# Patient Record
Sex: Female | Born: 1960 | Race: Black or African American | Hispanic: No | Marital: Married | Smoking: Former smoker
Health system: Southern US, Community
[De-identification: ages and names within clinical notes are randomized; demographics above are authoritative.]

## PROBLEM LIST (undated history)

## (undated) DIAGNOSIS — Z803 Family history of malignant neoplasm of breast: Secondary | ICD-10-CM

## (undated) DIAGNOSIS — R61 Generalized hyperhidrosis: Secondary | ICD-10-CM

## (undated) DIAGNOSIS — R5383 Other fatigue: Secondary | ICD-10-CM

## (undated) DIAGNOSIS — Z800 Family history of malignant neoplasm of digestive organs: Secondary | ICD-10-CM

## (undated) DIAGNOSIS — I10 Essential (primary) hypertension: Secondary | ICD-10-CM

## (undated) DIAGNOSIS — C189 Malignant neoplasm of colon, unspecified: Secondary | ICD-10-CM

## (undated) DIAGNOSIS — E079 Disorder of thyroid, unspecified: Secondary | ICD-10-CM

## (undated) DIAGNOSIS — R51 Headache: Secondary | ICD-10-CM

## (undated) DIAGNOSIS — M1990 Unspecified osteoarthritis, unspecified site: Secondary | ICD-10-CM

## (undated) DIAGNOSIS — G629 Polyneuropathy, unspecified: Secondary | ICD-10-CM

## (undated) DIAGNOSIS — Z973 Presence of spectacles and contact lenses: Secondary | ICD-10-CM

## (undated) DIAGNOSIS — K649 Unspecified hemorrhoids: Secondary | ICD-10-CM

## (undated) DIAGNOSIS — E785 Hyperlipidemia, unspecified: Secondary | ICD-10-CM

## (undated) DIAGNOSIS — Z8042 Family history of malignant neoplasm of prostate: Secondary | ICD-10-CM

## (undated) HISTORY — DX: Hyperlipidemia, unspecified: E78.5

## (undated) HISTORY — DX: Other fatigue: R53.83

## (undated) HISTORY — PX: APPENDECTOMY: SHX54

## (undated) HISTORY — DX: Unspecified osteoarthritis, unspecified site: M19.90

## (undated) HISTORY — DX: Essential (primary) hypertension: I10

## (undated) HISTORY — PX: OTHER SURGICAL HISTORY: SHX169

## (undated) HISTORY — DX: Disorder of thyroid, unspecified: E07.9

## (undated) HISTORY — DX: Family history of malignant neoplasm of breast: Z80.3

## (undated) HISTORY — DX: Generalized hyperhidrosis: R61

## (undated) HISTORY — DX: Headache: R51

## (undated) HISTORY — DX: Malignant neoplasm of colon, unspecified: C18.9

## (undated) HISTORY — DX: Unspecified hemorrhoids: K64.9

## (undated) HISTORY — DX: Family history of malignant neoplasm of prostate: Z80.42

## (undated) HISTORY — PX: TUBAL LIGATION: SHX77

## (undated) HISTORY — DX: Presence of spectacles and contact lenses: Z97.3

## (undated) HISTORY — PX: ABDOMINAL HYSTERECTOMY: SHX81

## (undated) HISTORY — DX: Polyneuropathy, unspecified: G62.9

## (undated) HISTORY — DX: Family history of malignant neoplasm of digestive organs: Z80.0

---

## 2002-02-09 ENCOUNTER — Emergency Department (HOSPITAL_COMMUNITY): Admission: EM | Admit: 2002-02-09 | Discharge: 2002-02-09 | Payer: Self-pay | Admitting: Emergency Medicine

## 2002-02-09 ENCOUNTER — Encounter: Payer: Self-pay | Admitting: Emergency Medicine

## 2002-02-12 ENCOUNTER — Encounter: Payer: Self-pay | Admitting: Emergency Medicine

## 2002-02-12 ENCOUNTER — Ambulatory Visit (HOSPITAL_COMMUNITY): Admission: RE | Admit: 2002-02-12 | Discharge: 2002-02-12 | Payer: Self-pay | Admitting: Emergency Medicine

## 2002-02-13 ENCOUNTER — Encounter: Payer: Self-pay | Admitting: Obstetrics and Gynecology

## 2002-02-13 ENCOUNTER — Encounter: Admission: RE | Admit: 2002-02-13 | Discharge: 2002-02-13 | Payer: Self-pay | Admitting: Obstetrics and Gynecology

## 2003-04-30 ENCOUNTER — Other Ambulatory Visit: Admission: RE | Admit: 2003-04-30 | Discharge: 2003-04-30 | Payer: Self-pay | Admitting: Obstetrics & Gynecology

## 2003-08-26 ENCOUNTER — Inpatient Hospital Stay (HOSPITAL_COMMUNITY): Admission: RE | Admit: 2003-08-26 | Discharge: 2003-08-28 | Payer: Self-pay | Admitting: Obstetrics and Gynecology

## 2003-08-26 ENCOUNTER — Encounter (INDEPENDENT_AMBULATORY_CARE_PROVIDER_SITE_OTHER): Payer: Self-pay | Admitting: *Deleted

## 2003-09-03 ENCOUNTER — Inpatient Hospital Stay (HOSPITAL_COMMUNITY): Admission: EM | Admit: 2003-09-03 | Discharge: 2003-09-07 | Payer: Self-pay | Admitting: Emergency Medicine

## 2004-10-21 ENCOUNTER — Emergency Department (HOSPITAL_COMMUNITY): Admission: EM | Admit: 2004-10-21 | Discharge: 2004-10-21 | Payer: Self-pay | Admitting: Emergency Medicine

## 2005-06-25 ENCOUNTER — Emergency Department (HOSPITAL_COMMUNITY): Admission: EM | Admit: 2005-06-25 | Discharge: 2005-06-25 | Payer: Self-pay | Admitting: Emergency Medicine

## 2007-05-15 ENCOUNTER — Encounter: Admission: RE | Admit: 2007-05-15 | Discharge: 2007-05-15 | Payer: Self-pay | Admitting: Family Medicine

## 2007-10-10 ENCOUNTER — Encounter: Admission: RE | Admit: 2007-10-10 | Discharge: 2007-10-10 | Payer: Self-pay | Admitting: Family Medicine

## 2007-10-20 ENCOUNTER — Encounter: Admission: RE | Admit: 2007-10-20 | Discharge: 2007-10-20 | Payer: Self-pay | Admitting: Family Medicine

## 2009-08-25 ENCOUNTER — Encounter: Admission: RE | Admit: 2009-08-25 | Discharge: 2009-08-25 | Payer: Self-pay | Admitting: Gastroenterology

## 2009-09-08 ENCOUNTER — Inpatient Hospital Stay (HOSPITAL_COMMUNITY): Admission: RE | Admit: 2009-09-08 | Discharge: 2009-09-13 | Payer: Self-pay | Admitting: General Surgery

## 2009-09-08 ENCOUNTER — Encounter (INDEPENDENT_AMBULATORY_CARE_PROVIDER_SITE_OTHER): Payer: Self-pay | Admitting: General Surgery

## 2009-09-08 DIAGNOSIS — C189 Malignant neoplasm of colon, unspecified: Secondary | ICD-10-CM

## 2009-09-08 HISTORY — DX: Malignant neoplasm of colon, unspecified: C18.9

## 2009-09-09 HISTORY — PX: RIGHT COLECTOMY: SHX853

## 2009-09-12 ENCOUNTER — Ambulatory Visit: Payer: Self-pay | Admitting: Oncology

## 2009-09-26 ENCOUNTER — Emergency Department (HOSPITAL_COMMUNITY): Admission: EM | Admit: 2009-09-26 | Discharge: 2009-09-26 | Payer: Self-pay | Admitting: Emergency Medicine

## 2009-10-25 ENCOUNTER — Inpatient Hospital Stay (HOSPITAL_COMMUNITY): Admission: EM | Admit: 2009-10-25 | Discharge: 2009-10-27 | Payer: Self-pay | Admitting: Emergency Medicine

## 2009-11-12 ENCOUNTER — Ambulatory Visit: Payer: Self-pay | Admitting: Oncology

## 2009-11-13 LAB — CBC WITH DIFFERENTIAL/PLATELET
HCT: 33 % — ABNORMAL LOW (ref 34.8–46.6)
LYMPH%: 33.4 % (ref 14.0–49.7)
MCH: 25 pg — ABNORMAL LOW (ref 25.1–34.0)
MCHC: 32.6 g/dL (ref 31.5–36.0)
MCV: 76.9 fL — ABNORMAL LOW (ref 79.5–101.0)
MONO%: 10.4 % (ref 0.0–14.0)
NEUT%: 53.5 % (ref 38.4–76.8)
Platelets: 232 10*3/uL (ref 145–400)
RBC: 4.29 10*6/uL (ref 3.70–5.45)

## 2010-03-24 ENCOUNTER — Ambulatory Visit: Payer: Self-pay | Admitting: Oncology

## 2010-03-26 LAB — CBC WITH DIFFERENTIAL/PLATELET
Eosinophils Absolute: 0.1 10*3/uL (ref 0.0–0.5)
HGB: 10.2 g/dL — ABNORMAL LOW (ref 11.6–15.9)
LYMPH%: 25.7 % (ref 14.0–49.7)
MCHC: 32.5 g/dL (ref 31.5–36.0)
MONO#: 0.6 10*3/uL (ref 0.1–0.9)
NEUT%: 64.3 % (ref 38.4–76.8)
RBC: 4.03 10*6/uL (ref 3.70–5.45)
RDW: 17 % — ABNORMAL HIGH (ref 11.2–14.5)
WBC: 7.5 10*3/uL (ref 3.9–10.3)
lymph#: 1.9 10*3/uL (ref 0.9–3.3)

## 2010-06-23 ENCOUNTER — Ambulatory Visit: Payer: Self-pay | Admitting: Oncology

## 2010-06-25 LAB — CBC WITH DIFFERENTIAL/PLATELET
EOS%: 0.8 % (ref 0.0–7.0)
Eosinophils Absolute: 0.1 10*3/uL (ref 0.0–0.5)
LYMPH%: 27.4 % (ref 14.0–49.7)
MCH: 26.1 pg (ref 25.1–34.0)
MONO#: 0.6 10*3/uL (ref 0.1–0.9)
NEUT#: 4.3 10*3/uL (ref 1.5–6.5)
NEUT%: 63.2 % (ref 38.4–76.8)
WBC: 6.9 10*3/uL (ref 3.9–10.3)
lymph#: 1.9 10*3/uL (ref 0.9–3.3)

## 2010-07-03 ENCOUNTER — Ambulatory Visit (HOSPITAL_COMMUNITY)
Admission: RE | Admit: 2010-07-03 | Discharge: 2010-07-03 | Payer: Self-pay | Source: Home / Self Care | Attending: Oncology | Admitting: Oncology

## 2010-07-22 ENCOUNTER — Ambulatory Visit (HOSPITAL_COMMUNITY)
Admission: RE | Admit: 2010-07-22 | Discharge: 2010-07-22 | Payer: Self-pay | Source: Home / Self Care | Attending: Oncology | Admitting: Oncology

## 2010-08-09 ENCOUNTER — Encounter: Payer: Self-pay | Admitting: Oncology

## 2010-08-31 ENCOUNTER — Other Ambulatory Visit: Payer: Self-pay | Admitting: General Surgery

## 2010-08-31 ENCOUNTER — Encounter (HOSPITAL_COMMUNITY): Payer: BC Managed Care – PPO

## 2010-08-31 DIAGNOSIS — Z01812 Encounter for preprocedural laboratory examination: Secondary | ICD-10-CM | POA: Insufficient documentation

## 2010-08-31 LAB — BASIC METABOLIC PANEL
CO2: 28 mEq/L (ref 19–32)
Calcium: 9.6 mg/dL (ref 8.4–10.5)
Chloride: 102 mEq/L (ref 96–112)
Glucose, Bld: 148 mg/dL — ABNORMAL HIGH (ref 70–99)
Sodium: 139 mEq/L (ref 135–145)

## 2010-08-31 LAB — URINALYSIS, ROUTINE W REFLEX MICROSCOPIC
Ketones, ur: NEGATIVE mg/dL
Protein, ur: NEGATIVE mg/dL
Urine Glucose, Fasting: 250 mg/dL — AB

## 2010-08-31 LAB — DIFFERENTIAL
Eosinophils Relative: 0 % (ref 0–5)
Lymphocytes Relative: 18 % (ref 12–46)
Lymphs Abs: 2.1 10*3/uL (ref 0.7–4.0)
Monocytes Absolute: 1.1 10*3/uL — ABNORMAL HIGH (ref 0.1–1.0)

## 2010-08-31 LAB — CBC
HCT: 35.5 % — ABNORMAL LOW (ref 36.0–46.0)
MCV: 79.1 fL (ref 78.0–100.0)
Platelets: 248 10*3/uL (ref 150–400)
RDW: 15.8 % — ABNORMAL HIGH (ref 11.5–15.5)

## 2010-08-31 LAB — APTT: aPTT: 30 seconds (ref 24–37)

## 2010-08-31 LAB — PROTIME-INR: Prothrombin Time: 13.6 seconds (ref 11.6–15.2)

## 2010-09-02 ENCOUNTER — Other Ambulatory Visit: Payer: Self-pay | Admitting: General Surgery

## 2010-09-02 ENCOUNTER — Observation Stay (HOSPITAL_COMMUNITY)
Admission: RE | Admit: 2010-09-02 | Discharge: 2010-09-03 | Disposition: A | Discharge status: Home / Self Care | Payer: BC Managed Care – PPO | Source: Ambulatory Visit | Attending: General Surgery | Admitting: General Surgery

## 2010-09-02 DIAGNOSIS — Z01812 Encounter for preprocedural laboratory examination: Secondary | ICD-10-CM | POA: Insufficient documentation

## 2010-09-02 DIAGNOSIS — C189 Malignant neoplasm of colon, unspecified: Secondary | ICD-10-CM | POA: Insufficient documentation

## 2010-09-02 DIAGNOSIS — Z90710 Acquired absence of both cervix and uterus: Secondary | ICD-10-CM | POA: Insufficient documentation

## 2010-09-02 DIAGNOSIS — E119 Type 2 diabetes mellitus without complications: Secondary | ICD-10-CM | POA: Insufficient documentation

## 2010-09-02 DIAGNOSIS — I10 Essential (primary) hypertension: Secondary | ICD-10-CM | POA: Insufficient documentation

## 2010-09-02 DIAGNOSIS — Z79899 Other long term (current) drug therapy: Secondary | ICD-10-CM | POA: Insufficient documentation

## 2010-09-02 DIAGNOSIS — E042 Nontoxic multinodular goiter: Principal | ICD-10-CM | POA: Insufficient documentation

## 2010-09-02 HISTORY — PX: THYROID SURGERY: SHX805

## 2010-09-02 LAB — GLUCOSE, CAPILLARY
Glucose-Capillary: 135 mg/dL — ABNORMAL HIGH (ref 70–99)
Glucose-Capillary: 230 mg/dL — ABNORMAL HIGH (ref 70–99)
Glucose-Capillary: 239 mg/dL — ABNORMAL HIGH (ref 70–99)
Glucose-Capillary: 207 mg/dL — ABNORMAL HIGH (ref 70–99)

## 2010-09-03 LAB — GLUCOSE, CAPILLARY: Glucose-Capillary: 150 mg/dL — ABNORMAL HIGH (ref 70–99)

## 2010-09-03 LAB — HEMOGLOBIN A1C
Hgb A1c MFr Bld: 6.8 % — ABNORMAL HIGH (ref <5.7)
Mean Plasma Glucose: 148 mg/dL — ABNORMAL HIGH (ref <117)

## 2010-09-03 LAB — CALCIUM: Calcium: 8.5 mg/dL (ref 8.4–10.5)

## 2010-09-06 NOTE — Op Note (Signed)
Brenda [name], Brenda [name]                ACCOUNT NO.:  [id number]  MEDICAL RECORD NO.:  [id number]           PATIENT TYPE:  I  LOCATION:  1528                         FACILITY:  [facility]  PHYSICIAN:  Almond Lint, MD       DATE OF BIRTH:  12/25/1960  DATE OF PROCEDURE:  09/02/2010 DATE OF DISCHARGE:                              OPERATIVE REPORT   SURGEON:  Almond Lint, MD  PREOPERATIVE DIAGNOSIS:  Bilateral thyroid nodules.  POSTOPERATIVE DIAGNOSIS:  Bilateral thyroid nodules.  PROCEDURE:  Total thyroidectomy.  ASSISTANT:  Brayton El, PA-C  ANESTHESIA:  General.  FINDINGS:  Bilateral nodules, 3 parathyroids seen and preserved, and bilateral nerves seen and avoided.  SPECIMEN:  Thyroid to Pathology.  ESTIMATED BLOOD LOSS:  Minimal.  COMPLICATIONS:  None known.  PROCEDURE IN DETAIL:  Brenda [name] was identified in the holding area and taken to the operating room where she was placed supine on the operating room table.  General anesthesia was induced.  Her arms were tucked and her neck was extended.  Her upper chest and neck were prepped and draped in sterile fashion.  Time-out was performed according to surgical safety check list.  When all was correct, we continued.  The patient did not have an appropriate skin line in the region 1 fingerbreadth above the clavicle, so a skin crease was created with a 2-0 silk suture.  A 5 cm incision was measured and the skin was protected with a Tegaderm.  The skin was then incised with a #15 blade.  The subcutaneous tissues were divided with cautery.  The platysma was opened with the cautery as well. The platysma was elevated on the superior aspect of Allis clamps and the platysma flap was created with the cautery.  There was a vein that had to be suture ligated.  Inferiorly, the platysma was elevated and this subplatysmal flap was created down to the sternal notch.  The subplatysmal flaps were extended laterally to the anterior border of  the sternocleidomastoid.  The strap muscles were then opened in the midline and at the upper extent of the fascial incision.  A large bridging vein had to be suture ligated.  Inferiorly, this was not encountered.    The strap muscles were then retracted with Army-Navy retractors and the cautery was used to take the strap muscles off the surface of the thyroid.  The tonsil was then used to bluntly spread the tissues more posteriorly.  The dissection then continued up to the superior pole on the right first. Superior pole was isolated with a tonsil clamp and then the vessels were tied with a 2-0 silk and clipped and then divided with the Harmonic scalpel.  The superior parathyroid on the right was seen and left intact.  There were additional small vessels that required clipping and dividing with the Harmonic.  The middle thyroid vein was clipped and divided with the Harmonic.  The nerve on the right was seen easily and this was avoided.  The inferior pole was then taken down by isolating the vessels with the tonsil tying it off with a 2-0 silk and  then clipping and tying.  Again, multiple bites had to be taken as the vessels arborize significantly.  The [facility] right angle was used to skeletonize the very small veins anterior to the nerve and these were carefully taken with small clips and Metzenbaum scissors.  Once the dissection was far enough away from the recurrent laryngeal nerve, the cautery was used to take the ligament of Berry off the anterior surface of the thyroid.  On the inferior aspect of the thyroid on this side, there was a large vein that had to be suture ligated.  Dissection then proceeded to the other side.    Similarly, the strap muscles were elevated off the thyroid and taken down with the cautery.  The tonsil was used to spread the tissues to allow for better delineation of the muscles from the surface of the thyroid.  This left side wrapped around the trachea much  further.  The tonsil was used to isolate the superior pole vessels. The middle thyroid vein was then clipped and then the Harmonic.  The recurrent laryngeal nerve on this side was then ligated and avoided. The inferior pole was then taken.  This parathyroid was not seen on the left inferior aspect.  The inferior pole was skeletonized, tied, clipped, and divided with the Harmonic scalpel.  The small vessels right around the nerve were carefully dissected anterior to the nerve and clipped with very small clips.  Once the dissection had proceeded enough medially, the remainder of the thyroid was taken off with the cautery. A stitch was marked in the left superior pole and passed off the table. The cavities were then inspected for hemostasis meticulously.  On the left, there was a very small area of bleeding on the cricopharyngeus and this was cauterized.  There was a small muscle bleed in the strap muscles that was cauterized.  This was then reirrigated and no additional bleeding was seen.  The right side was then irrigated and no bleeding was seen.  Trachea was pulled further to the left and no bleeding was seen on the right.  This was packed and both sides were reinspected again with no evidence of bleeding.  The Surgicel was then placed into the wound.    The strap muscles were reapproximated with a running 3-0 Vicryl.  The subplatysmal flaps were carefully examined and there was no evidence of bleeding here.  The platysma was reapproximated with interrupted 3-0 Vicryl pops and then the skin run with 4-0 Monocryl in subcuticular fashion.  The wound was then cleaned, dried, and dressed with Optifoam.  The patient tolerated the procedure well.  She was awakened from anesthesia and taken to PACU in stable condition.  Needle, sponge, and instrument counts were correct x2.     Almond Lint, MD     FB/MEDQ  D:  09/02/2010  T:  09/03/2010  Job:  [id number]  Electronically Signed by Almond Lint MD on 09/06/2010 12:36:19 AM

## 2010-09-06 NOTE — Discharge Summary (Signed)
  [name], Brenda [name]                ACCOUNT NO.:  [id number]  MEDICAL RECORD NO.:  [id number]           PATIENT TYPE:  I  LOCATION:  1528                         FACILITY:  [facility]  PHYSICIAN:  Almond Lint, MD       DATE OF BIRTH:  [birth year]  DATE OF ADMISSION:  09/02/2010 DATE OF DISCHARGE:  09/03/2010                              DISCHARGE SUMMARY   PRINCIPAL DIAGNOSIS:  Bilateral thyroid nodules.  PRINCIPAL PROCEDURE:  Total thyroidectomy on February 15.  SECONDARY DIAGNOSES:  T2 N2 colon cancer, diabetes, hypertension.  DISCHARGE MEDICATIONS: 1. Percocet 5/325 mg 1-2 tablets p.o. q.4 hours p.r.n. pain. 2. Tums 3 tablets 3 times a day for 2 weeks. 3. Colace 100 mg once a day. 4. Senna 1 tablet once a day. 5. Omega-3 fatty acids 1 tablet b.i.d. 6. Olmesartan 20 mg once a day. 7. Metformin 500 mg XR daily with meals. 8. Iron complex 150 mg once a day.  HOSPITAL COURSE:  [name] was admitted to the hospital following her total thyroidectomy.  She did well and by the next morning had reasonable pain control with Percocet.  She was able to eat without nausea.  She has symptoms of hypocalcemia and her calcium is 8.5.  She is able to void and ambulate on her own.  She will be discharged to home in improved condition and she will follow up with me in 2-3 weeks.     Almond Lint, MD     FB/MEDQ  D:  09/03/2010  T:  09/03/2010  Job:  [id number]  cc:   Dr. Montez Morita  Electronically Signed by Almond Lint MD on 09/06/2010 12:36:47 AM

## 2010-10-07 LAB — GLUCOSE, CAPILLARY
Glucose-Capillary: 113 mg/dL — ABNORMAL HIGH (ref 70–99)
Glucose-Capillary: 114 mg/dL — ABNORMAL HIGH (ref 70–99)
Glucose-Capillary: 114 mg/dL — ABNORMAL HIGH (ref 70–99)
Glucose-Capillary: 124 mg/dL — ABNORMAL HIGH (ref 70–99)
Glucose-Capillary: 139 mg/dL — ABNORMAL HIGH (ref 70–99)
Glucose-Capillary: 143 mg/dL — ABNORMAL HIGH (ref 70–99)
Glucose-Capillary: 144 mg/dL — ABNORMAL HIGH (ref 70–99)
Glucose-Capillary: 147 mg/dL — ABNORMAL HIGH (ref 70–99)
Glucose-Capillary: 150 mg/dL — ABNORMAL HIGH (ref 70–99)
Glucose-Capillary: 153 mg/dL — ABNORMAL HIGH (ref 70–99)
Glucose-Capillary: 154 mg/dL — ABNORMAL HIGH (ref 70–99)
Glucose-Capillary: 154 mg/dL — ABNORMAL HIGH (ref 70–99)
Glucose-Capillary: 159 mg/dL — ABNORMAL HIGH (ref 70–99)
Glucose-Capillary: 161 mg/dL — ABNORMAL HIGH (ref 70–99)
Glucose-Capillary: 169 mg/dL — ABNORMAL HIGH (ref 70–99)
Glucose-Capillary: 171 mg/dL — ABNORMAL HIGH (ref 70–99)
Glucose-Capillary: 172 mg/dL — ABNORMAL HIGH (ref 70–99)
Glucose-Capillary: 175 mg/dL — ABNORMAL HIGH (ref 70–99)
Glucose-Capillary: 198 mg/dL — ABNORMAL HIGH (ref 70–99)

## 2010-10-07 LAB — URINALYSIS, ROUTINE W REFLEX MICROSCOPIC
Ketones, ur: 15 mg/dL — AB
Nitrite: NEGATIVE
Specific Gravity, Urine: 1.016 (ref 1.005–1.030)
Urobilinogen, UA: 0.2 mg/dL (ref 0.0–1.0)
pH: 5 (ref 5.0–8.0)

## 2010-10-07 LAB — DIFFERENTIAL
Basophils Absolute: 0 10*3/uL (ref 0.0–0.1)
Basophils Absolute: 0.1 10*3/uL (ref 0.0–0.1)
Basophils Relative: 1 % (ref 0–1)
Basophils Relative: 1 % (ref 0–1)
Lymphocytes Relative: 26 % (ref 12–46)
Monocytes Absolute: 0.7 10*3/uL (ref 0.1–1.0)
Monocytes Absolute: 0.8 10*3/uL (ref 0.1–1.0)
Neutro Abs: 4.9 10*3/uL (ref 1.7–7.7)
Neutro Abs: 6.2 10*3/uL (ref 1.7–7.7)
Neutrophils Relative %: 57 % (ref 43–77)
Neutrophils Relative %: 65 % (ref 43–77)

## 2010-10-07 LAB — COMPREHENSIVE METABOLIC PANEL
ALT: 31 U/L (ref 0–35)
Albumin: 3.6 g/dL (ref 3.5–5.2)
Alkaline Phosphatase: 58 U/L (ref 39–117)
Alkaline Phosphatase: 74 U/L (ref 39–117)
BUN: 15 mg/dL (ref 6–23)
CO2: 25 mEq/L (ref 19–32)
CO2: 28 mEq/L (ref 19–32)
Chloride: 105 mEq/L (ref 96–112)
GFR calc Af Amer: 44 mL/min — ABNORMAL LOW (ref >60)
GFR calc non Af Amer: 36 mL/min — ABNORMAL LOW (ref >60)
GFR calc non Af Amer: >60 mL/min (ref >60)
Glucose, Bld: 124 mg/dL — ABNORMAL HIGH (ref 70–99)
Glucose, Bld: 149 mg/dL — ABNORMAL HIGH (ref 70–99)
Potassium: 3.8 mEq/L (ref 3.5–5.1)
Sodium: 139 mEq/L (ref 135–145)
Total Bilirubin: 0.4 mg/dL (ref 0.3–1.2)

## 2010-10-07 LAB — BASIC METABOLIC PANEL
BUN: 21 mg/dL (ref 6–23)
CO2: 24 mEq/L (ref 19–32)
Calcium: 7.8 mg/dL — ABNORMAL LOW (ref 8.4–10.5)
Chloride: 99 mEq/L (ref 96–112)
Creatinine, Ser: 0.55 mg/dL (ref 0.4–1.2)
Creatinine, Ser: 0.62 mg/dL (ref 0.4–1.2)
GFR calc Af Amer: >60 mL/min (ref >60)
GFR calc Af Amer: >60 mL/min (ref >60)
GFR calc non Af Amer: >60 mL/min (ref >60)
GFR calc non Af Amer: >60 mL/min (ref >60)
Glucose, Bld: 114 mg/dL — ABNORMAL HIGH (ref 70–99)
Glucose, Bld: 154 mg/dL — ABNORMAL HIGH (ref 70–99)
Potassium: 3.2 mEq/L — ABNORMAL LOW (ref 3.5–5.1)
Potassium: 3.4 mEq/L — ABNORMAL LOW (ref 3.5–5.1)
Sodium: 138 mEq/L (ref 135–145)
Sodium: 141 mEq/L (ref 135–145)

## 2010-10-07 LAB — CBC
HCT: 27.5 % — ABNORMAL LOW (ref 36.0–46.0)
HCT: 33.7 % — ABNORMAL LOW (ref 36.0–46.0)
HCT: 33.7 % — ABNORMAL LOW (ref 36.0–46.0)
Hemoglobin: 10.9 g/dL — ABNORMAL LOW (ref 12.0–15.0)
Hemoglobin: 8.9 g/dL — ABNORMAL LOW (ref 12.0–15.0)
MCHC: 32.4 g/dL (ref 30.0–36.0)
MCV: 76.4 fL — ABNORMAL LOW (ref 78.0–100.0)
MCV: 77.9 fL — ABNORMAL LOW (ref 78.0–100.0)
Platelets: 214 10*3/uL (ref 150–400)
Platelets: 215 10*3/uL (ref 150–400)
RBC: 3.37 MIL/uL — ABNORMAL LOW (ref 3.87–5.11)
RBC: 4.32 MIL/uL (ref 3.87–5.11)
RDW: 18.7 % — ABNORMAL HIGH (ref 11.5–15.5)
WBC: 7.8 10*3/uL (ref 4.0–10.5)
WBC: 8.7 10*3/uL (ref 4.0–10.5)
WBC: 8.8 10*3/uL (ref 4.0–10.5)
WBC: 9.5 10*3/uL (ref 4.0–10.5)

## 2010-10-07 LAB — LIPASE, BLOOD: Lipase: 28 U/L (ref 11–59)

## 2010-10-11 LAB — BASIC METABOLIC PANEL
BUN: 34 mg/dL — ABNORMAL HIGH (ref 6–23)
Calcium: 9.4 mg/dL (ref 8.4–10.5)
GFR calc non Af Amer: 47 mL/min — ABNORMAL LOW (ref >60)
Glucose, Bld: 128 mg/dL — ABNORMAL HIGH (ref 70–99)
Potassium: 3.4 mEq/L — ABNORMAL LOW (ref 3.5–5.1)
Sodium: 138 mEq/L (ref 135–145)

## 2010-10-11 LAB — DIFFERENTIAL
Basophils Absolute: 0.1 10*3/uL (ref 0.0–0.1)
Basophils Relative: 1 % (ref 0–1)
Eosinophils Absolute: 0.2 K/uL (ref 0.0–0.7)
Eosinophils Relative: 3 % (ref 0–5)
Lymphocytes Relative: 34 % (ref 12–46)
Lymphs Abs: 2.8 10*3/uL (ref 0.7–4.0)
Monocytes Absolute: 0.6 K/uL (ref 0.1–1.0)
Monocytes Relative: 7 % (ref 3–12)
Neutro Abs: 4.7 10*3/uL (ref 1.7–7.7)
Neutrophils Relative %: 56 % (ref 43–77)

## 2010-10-11 LAB — URINALYSIS, ROUTINE W REFLEX MICROSCOPIC
Bilirubin Urine: NEGATIVE
Glucose, UA: NEGATIVE mg/dL
Hgb urine dipstick: NEGATIVE
Ketones, ur: 15 mg/dL — AB
Nitrite: NEGATIVE
Protein, ur: NEGATIVE mg/dL
Specific Gravity, Urine: 1.017 (ref 1.005–1.030)
Urobilinogen, UA: 0.2 mg/dL (ref 0.0–1.0)
pH: 5 (ref 5.0–8.0)

## 2010-10-11 LAB — BASIC METABOLIC PANEL WITH GFR
CO2: 26 meq/L (ref 19–32)
Chloride: 101 meq/L (ref 96–112)
Creatinine, Ser: 1.22 mg/dL — ABNORMAL HIGH (ref 0.4–1.2)
GFR calc Af Amer: 56 mL/min — ABNORMAL LOW (ref >60)

## 2010-10-11 LAB — CBC
HCT: 34.8 % — ABNORMAL LOW (ref 36.0–46.0)
Hemoglobin: 11.2 g/dL — ABNORMAL LOW (ref 12.0–15.0)
MCHC: 32.1 g/dL (ref 30.0–36.0)
MCV: 76.6 fL — ABNORMAL LOW (ref 78.0–100.0)
Platelets: 322 10*3/uL (ref 150–400)
RBC: 4.55 MIL/uL (ref 3.87–5.11)
RDW: 15.3 % (ref 11.5–15.5)
WBC: 8.4 10*3/uL (ref 4.0–10.5)

## 2010-10-11 LAB — SAMPLE TO BLOOD BANK

## 2010-10-16 ENCOUNTER — Other Ambulatory Visit: Payer: Self-pay | Admitting: Oncology

## 2010-10-16 ENCOUNTER — Encounter (HOSPITAL_BASED_OUTPATIENT_CLINIC_OR_DEPARTMENT_OTHER): Payer: BC Managed Care – PPO | Admitting: Oncology

## 2010-10-16 DIAGNOSIS — C189 Malignant neoplasm of colon, unspecified: Secondary | ICD-10-CM

## 2010-10-16 DIAGNOSIS — E041 Nontoxic single thyroid nodule: Secondary | ICD-10-CM

## 2010-10-16 DIAGNOSIS — D649 Anemia, unspecified: Secondary | ICD-10-CM

## 2010-10-16 LAB — CBC WITH DIFFERENTIAL/PLATELET
Basophils Absolute: 0 10*3/uL (ref 0.0–0.1)
Eosinophils Absolute: 0.1 10*3/uL (ref 0.0–0.5)
HGB: 11.7 g/dL (ref 11.6–15.9)
MONO#: 0.6 10*3/uL (ref 0.1–0.9)
MONO%: 8.6 % (ref 0.0–14.0)
NEUT#: 4.4 10*3/uL (ref 1.5–6.5)
RBC: 4.24 10*6/uL (ref 3.70–5.45)
RDW: 14.9 % — ABNORMAL HIGH (ref 11.2–14.5)
WBC: 7.1 10*3/uL (ref 3.9–10.3)
lymph#: 2 10*3/uL (ref 0.9–3.3)

## 2010-10-16 LAB — FERRITIN: Ferritin: 75 ng/mL (ref 10–291)

## 2010-12-04 NOTE — H&P (Signed)
[name], [name]                          ACCOUNT NO.:  [id number]   MEDICAL RECORD NO.:  [id number]                   PATIENT TYPE:  INP   LOCATION:  0442                                 FACILITY:  [facility]   PHYSICIAN:  Gerri Spore B. Earlene Plater, M.D.               DATE OF BIRTH:  18-Feb-1961   DATE OF ADMISSION:  09/03/2003  DATE OF DISCHARGE:                                HISTORY & PHYSICAL   ADMISSION DIAGNOSIS:  Abdominal and pelvic abscess 1 week status post  abdominal hysterectomy.   HISTORY OF PRESENT ILLNESS:  A 50 year old African-American female seen  today in the office with complaints of several days of intermittent fever  and chills and lower abdominal pain.  She has had some nausea and some mild  cold symptoms including congestion and stuffiness but no definite cough or  shortness of breath.  She had an uncomplicated hysterectomy for fibroids  last week with Dr. Rosalio Macadamia and said her postoperative stay was  unremarkable and without fever.   In the office today, she was found to have leukocytosis with a WBC of 15.6  with a preponderance of granulocytes, hemoglobin 10.2, and platelets 373.  CT scan was performed at [facility] which showed abdominal and pelvic  abscess involving the rectus wall and continuing down below the symphysis  pubis as well as a collection of fluid in the pelvis and questionable area  of free air as well.  The patient is admitted for intravenous antibiotics,  surgical consultation, possible reexploration.   PAST MEDICAL HISTORY:  Hypertension.   PAST SURGICAL HISTORY:  Abdominal hysterectomy for fibroids as outlined  above.  Also, appendectomy and tubal ligation and shoulder surgery for  removal of benign tumor.   MEDICATIONS:  None.   ALLERGIES:  None.   SOCIAL HISTORY:  No alcohol, tobacco, or other drugs.   FAMILY HISTORY:  Diabetes, hypertension, colon cancer, hypercholesterolemia.   REVIEW OF SYSTEMS:  Otherwise, noncontributory.   PHYSICAL EXAMINATION:  VITAL SIGNS:  Blood pressure 134/82, pulse 120,  temperature 101.4.  GENERAL:  The patient is alert and oriented but is mildly ill-appearing in  no acute distress.  HEART:  Tachycardic but otherwise regular rate and rhythm.  LUNGS:  Clear.  ABDOMEN:  Soft.  Lower abdomen is tender but no acute signs noted.  The  incision is indurated and hyperemic.  The incision itself is intact but was  dermabonded apparently and does not yield to probing with a Q-tip.  PELVIC:  Normal external genitalia.  Vagina:  Normal cuff, appears normal,  however, is tender to palpation without a palpable mass.  However, the  patient's discomfort limits the exam.   CT scan and a white blood cell count as outlined above, concerning for  pelvic and abdominal abscess.   ASSESSMENT:  Pelvic and abdominal abscess after abdominal hysterectomy.   PLAN:  Admission for intravenous antibiotics and surgical consultation,  possible need for reexploration.                                               Gerri Spore B. Earlene Plater, M.D.    WBD/MEDQ  D:  09/03/2003  T:  09/03/2003  Job:  [id number]

## 2010-12-04 NOTE — Consult Note (Signed)
Brenda [name], Brenda [name]                          ACCOUNT NO.:  [id number]   MEDICAL RECORD NO.:  [id number]                   PATIENT TYPE:  INP   LOCATION:  0442                                 FACILITY:  [facility]   PHYSICIAN:  Sandria Bales. Ezzard Standing, M.D.               DATE OF BIRTH:  23-Feb-1961   DATE OF CONSULTATION:  09/03/2003  DATE OF DISCHARGE:                                   CONSULTATION   REASON FOR CONSULTATION:  Abdominal wound infection.   HISTORY OF ILLNESS:  This is a 50 year old black female who underwent an  abdominal hysterectomy on August 26, 2003, for excessively heavy periods.  She had no history of cancer and had an uneventful postoperative course.   However, over the last couple of days she has had increasing abdominal pain,  chills, and fever.  Dr. Rosalio Macadamia, I think, is out of town.  She was seen by  Gerri Spore B. Earlene Plater, M.D., who obtained a white blood count in the office of  15,600, and she was admitted to the hospital for further management.   PAST MEDICAL HISTORY:  Significant for hypertension, for which she is on  HCTZ.  She has no significant pulmonary problems, gastrointestinal problems,  or urologic problems.   She is married.  She has two grown children.  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   Dr. Kathrynn Running is her primary care physician.   PHYSICAL EXAMINATION:  VITAL SIGNS:  Her temperature is 100.9, her pulse is  100, blood pressure 126/71.  GENERAL:  She is a well-nourished black female who is uncomfortable with a  lower abdominal incision.  CHEST:  Her lungs are clear.  ABDOMEN:  Soft.  She has active bowel sounds.   I met Dr. Cherly Hensen at the bedside.  Dr. Cherly Hensen actually opened this wound,  but what she has is a transverse Pfannenstiel incision of her lower abdomen.  She has evidence of purulent material, some fascial necrosis, but no  evidence of wound dehiscence at this time.   Dr. Cherly Hensen has obtained cultures and has started cleaning and packing  the  wound.   1. My impression on Ms. Acheampong is she has a wound infection.  Plan is     aggressive local wound care.  She has been placed on IV antibiotics,     including gentamicin, Flagyl, and Nafcillin.  Cultures have been     obtained.  Will start     four times a day dressing changes, check another CBC on her in the     morning to make sure that things are getting better, and to go from     there.  2. Hypertension.  Sandria Bales. Ezzard Standing, M.D.    DHN/MEDQ  D:  09/04/2003  T:  09/04/2003  Job:  [id number]   cc:   Maxie Better, M.D.  [address]  [place]  [place] [address]  Fax: [phone]   Cordelia Pen A. Rosalio Macadamia, M.D.  [address]  [place]  [place] [address]  Fax: [phone]   Dr. Kathrynn Running

## 2010-12-04 NOTE — Op Note (Signed)
NAME:  Brenda [name], Brenda [name]                          ACCOUNT NO.:  [id number]   MEDICAL RECORD NO.:  [id number]                   PATIENT TYPE:  INP   LOCATION:  9399                                 FACILITY:  [facility]   PHYSICIAN:  Sherry A. Rosalio Macadamia, M.D.           DATE OF BIRTH:  [birth year]   DATE OF PROCEDURE:  08/26/2003  DATE OF DISCHARGE:                                 OPERATIVE REPORT   PREOPERATIVE DIAGNOSES:  1. Menorrhagia.  2. Fibroid uterus.   POSTOPERATIVE DIAGNOSES:  1. Menorrhagia.  2. Fibroid uterus.  3. Mild endometriosis.   PROCEDURE:  Total abdominal hysterectomy.   SURGEONS:  1. Sherry A. Rosalio Macadamia, M.D.  2. Maxie Better, M.D.   ANESTHESIA:  Epidural converted to general.   FINDINGS:  A 16-18 week sized uterus, normal tubes and ovaries, small amount  of endometriosis on the left ovary.   INDICATIONS:  This is a 51 year old, G2, P2-0-0-2 woman, who has had  excessively heavy menstrual periods for several years.  The patient was  initially evaluated a year ago because of her heavy periods, at which time  her hemoglobin was 6.5.  The patient was treated with iron and vitamins  which allowed her hemoglobin to increase significantly; however, her  bleeding continued.  Ultrasound was performed showing enlarged uterus with  multiple fibroids present.  Because of the size of the uterus and the  fibroids, the patient was treated with Lupron injections for approximately  six months to decrease her bleeding and decrease the size of the uterus.  The patient was continued on vitamins and iron and was prepared for surgery.  Hemoglobin prior to admission today was 11.8.  The patient was therefore  admitted for an abdominal hysterectomy.   DESCRIPTION OF PROCEDURE:  The patient was brought into the operating room  and given adequate epidural anesthesia.  She was placed in a frogleg  position.  Her abdomen and vagina were washed with Hibiclens.  Foley  catheter was  inserted into the bladder.  The patient was taken out of the  frogleg position.  She was draped in a sterile fashion.  Pfannenstiel  incision was made and brought down sharply to the fascia.  The fascia was  incised sharply.  The fascia was elevated off of the rectus muscle with  sharp and blunt dissection.  All bleeders were cauterized.  The peritoneum  was identified and entered bluntly.  This incision was extended superiorly  and inferiorly under direct visualization.  Balfour retractor was placed in  the abdominal cavity.  Bowel was packed off with wet lap pads.  The uterus  was of significant size and could not be delivered through the abdominal  incision.  Therefore, the surgery was begun in small steps.  The left ovary  was adherent to the uterus with endometriosis.  The endometriosis was  cauterized.  Uteroovarian ligament was clamped for back-bleeding.  The left  round  ligament was identified.  It was suture ligated with 0 Vicryl LigaSure  and incised.  The anterior leaf of the broad ligament was incised.  The left  uteroovarian ligaments were clamped x 2, cut, suture ligated with 0 Vicryl  LigaSure, and a second free tie was placed around the uteroovarian  ligaments.  This needed to be done in successive bites in this fashion  because the ligament was so distorted and stretched.  Once this was freed up  down to the round ligament, attention was turned to the right side.  During  this period of time, appendix an hour after the surgery had begun, the  patient became very uncomfortable.  The patient was redosed with significant  amount of epidural anesthetic; however, she continued to be uncomfortable,  and the anesthesiologist took the time to intubate the patient successfully.  At this point, once the patient was comfortable, the surgery continued.  The  right round ligament was identified.  It was suture ligated with 0 Vicryl  LigaSure, and the right uteroovarian ligaments were  doubly clamped, cut, and  suture ligated with 0 Vicryl and free tied with 0 Vicryl.  The right uterine  arteries could be identified.  These were doubly clamped, cut, and suture  ligated with 0 Vicryl in such a way to put 2 LigaSures across it.  The left  uterine arteries were very difficult to identify because of a left  pedunculated complex fibroid.  The blood vessels had to be dissected  carefully off of the fibroid, clamped, cut, and suture ligated.  The fibroid  was dissected with blunt and sharp dissection away from the uterus to  perform a myomectomy to allow the uterine vessels to be well-visualized.  Once the fibroid was able to be removed, the uterine vessels were  identified.  They were doubly clamped, cut, and suture ligated with 0 Vicryl  LigaSures in such a fashion to put 2 ties around most of these areas.  The  bladder flap was developed with blunt and sharp dissection.  The body of the  uterus was dissected off of the lower uterine segment and cervix with a  scalpel with careful protection of the bowel.  The cervix was grasped with  Kocher clamps.  The remaining cardinal ligaments were clamped, cut, and  suture ligated in such a fashion to go successively on either side down to  the uterosacral ligaments.  Uterosacral ligament are separately clamped,  cut, and suture ligated with 0 Vicryl LigaSures.  The angles of the vagina  were identified, clamped, cut, and suture ligated, and the remaining portion  of the cervix and lower uterine segment was excised from the vagina.  Vaginal cuff was closed with 0 Vicryl in a running locked whip stitch, and  the vaginal cuff itself was closed front to back with 0 Vicryl mattress-type  stitches.  There was some bleeding present between the cardinal ligaments  and some of the uterine artery on the left side, and this was identified,  clamped, and suture ligated with 0 Vicryl LigaSure.  Small bleeders were present under the bladder flap.   All bleeders were cauterized or sutured  with 0 Vicryl.  The left vascular pedicle was identified, and a small amount  of bleeding was seen.  This was cauterized; however, a large vessel began to  bleed at this time.  It was quickly clamped and free tied x 2 with 0 Vicryl.  Adequate hemostasis was present.  The pelvis was  inspected, and irrigation  was used.  No significant bleeding was present.  A small amount of bleeding  was present underneath the bladder flap, and Surgicel was placed in this  area.  All packs were removed from the abdomen.  The appendix was attempted  to be identified, but it was retrocecal, or it could not be identified and  felt that more manipulation of the bowel to identify it would cause more of  an ileus.  Once all the packs had been removed and confirmed, the peritoneal  edges were cauterized for any small bleeders.  The subfascial area was  cauterized.  There was some bleeding at the dome of the bladder.  This was  initially cauterized but because it would not stop, it was felt that  continued cautery could cause a significant injury.  Therefore, this area  was closed with a 3-0 chromic in a running lock stitch for both hemostasis  and support of the bladder serosa.  Adequate hemostasis was present  throughout.  The abdominal cavity was irrigated.  All irrigation was  suctioned out.  The fascia was then closed with 2-0 Vicryl stitches running  laterally to midline.  Incision irrigated.  Bleeders were cauterized.  Skin  was closed with 4-0 Monocryl in a subcuticular running stitch, and then the  incision was again closed with Dermabond.  The patient was then awakened;  she was extubated.  She was moved from the operating table to the stretcher  in stable condition.  Complications were none.  Estimated blood loss 400 mL.  Specimen was uterus and cervix.                                               Sherry A. Rosalio Macadamia, M.D.    SAD/MEDQ  D:  08/26/2003  T:   08/26/2003  Job:  [id number]

## 2010-12-04 NOTE — Discharge Summary (Signed)
NAME:  Brenda [name], Brenda [name]                          ACCOUNT NO.:  [id number]   MEDICAL RECORD NO.:  [id number]                   PATIENT TYPE:  INP   LOCATION:  9309                                 FACILITY:  [facility]   PHYSICIAN:  Sherry A. Rosalio Macadamia, M.D.           DATE OF BIRTH:  1961-03-20   DATE OF ADMISSION:  08/26/2003  DATE OF DISCHARGE:  08/28/2003                                 DISCHARGE SUMMARY   PROBLEM:  Menorrhagia, fibroid uterus.   SUBJECTIVE:  The patient is a 50 year old G2 P2-0-0-2 woman who has had  excessively heavy menstrual periods for several years.  The patient was  initially evaluated a year ago because of her heavy periods at which time  her hemoglobin was 6.5.  The patient was treated with iron and vitamins  which allowed her hemoglobin to increase significantly; however, her  bleeding has continued.  Ultrasound performed showed an enlarged uterus with  multiple fibroids present.  Because of the size of the uterus and the  fibroids the patient was treated with Lupron injections for approximately 6  months.  Her bleeding stopped and uterus decreased in size.  The patient is  admitted now for abdominal hysterectomy.   OBJECTIVE:  Physical exam:  HEENT within normal limits.  Neck without  lymphadenopathy.  Thyroid without nodule.  Chest clear to auscultation.  Heart regular rhythm without murmur.  Breasts without mass.  CVA nontender.  Abdomen obese, nontender.  External genitalia within normal limits.  Vagina  within normal limits.  Cervix normal.  Uterus 14- to 16-week size,  irregular, nontender.  Adnexa without obvious mass.   HOSPITAL COURSE:  The patient was admitted and brought to the operating room  where a TAH was performed.  Findings included a 16- to 18-week-size uterus,  normal tubes and ovaries, small amount of endometriosis of the left ovary.  The patient did well postoperatively.  She remained afebrile.  Her vital  signs remained stable.  Incision  looked good at the time of discharge.  The  patient was discharged to home on postoperative day #2.   ASSESSMENT:  Stable, status post total abdominal hysterectomy.   PLAN:  Follow up in the office in 3 weeks.  The patient will call with a  temperature greater than 101, heavy bleeding, or severe pain.  The patient  is discharged on Darvocet-N 100 #15 and Tylox #10.                                               Sherry A. Rosalio Macadamia, M.D.    SAD/MEDQ  D:  10/03/2003  T:  10/05/2003  Job:  [id number]

## 2010-12-04 NOTE — Discharge Summary (Signed)
Brenda [name], Brenda [name]                          ACCOUNT NO.:  [id number]   MEDICAL RECORD NO.:  [id number]                   PATIENT TYPE:  INP   LOCATION:  0442                                 FACILITY:  [facility]   PHYSICIAN:  Gerri Spore B. Earlene Plater, M.D.               DATE OF BIRTH:  1961-01-01   DATE OF ADMISSION:  09/03/2003  DATE OF DISCHARGE:  09/07/2003                                 DISCHARGE SUMMARY   ADMISSION DIAGNOSIS:  Postoperative cellulitis of the abdominal wall and  incision, status post hysterectomy one week prior to admission.   DISCHARGE DIAGNOSIS:  Postoperative cellulitis of the abdominal wall and  incision, status post hysterectomy one week prior to admission.   PROCEDURE:  Debridement .   HISTORY OF PRESENT ILLNESS:  A 50 year old African-American female status  post abdominal hysterectomy one week prior by Dr. Rosalio Macadamia.  Was seen in  the office with complaints of lower abdominal pain, intermittent fever to  the 102 range.  The abdominal decision was noted to be intact but indurated  and erythematous in the office.  CT scan at [facility] showed collection  of fluid in the abdomen and pelvis, and continuing into the abdominal wall  just below the rectus sheath.  The patient was admitted for intravenous  antibiotics, opening up the wound, and possible re-exploration.   HOSPITAL COURSE:  The patient was admitted to the GYN service.  Surgical  consultation was obtained.  The wound actually spontaneously opened.  This  was debrided and packed.  Cultures were obtained, and the patient was placed  on debridement, gentamicin and Flagyl.   Over the next several days, the wound continued to improve, the patient  defervesced, and her white count improved.  Follow up CT scan showed a 3 cm  fluid collection at the vaginal cuff without any other focal fluid  collection.  Obviously, this was dramatic improvement, and, as well, the  patient had improved clinically, and was  therefore discharged home in  satisfactory condition.   DISCHARGE MEDICATIONS:  1. Cipro 750 mg one p.o. b.i.d. x10 days.  2. Flagyl 500 mg p.o. t.i.d. x10 days.  3. Tylox 1-2 tablets p.o. q.4-6h. p.r.n. pain.   DISPOSITION:  The patient will go home with home health arranged for wound  treatment with packing.   FOLLOW UP:  1. Follow up with Dr. Ezzard Standing in 1 week.  2. Follow up with Dr. Rosalio Macadamia in 1 week.   CONDITION ON DISCHARGE:  Satisfactory and improved.   DISCHARGE INSTRUCTIONS:  No heavy lifting.  Nothing in the vagina for 6  weeks.  No driving for 2 weeks.  Call if needed for pain, nausea, vomiting,  or other concerns.  Gerri Spore B. Earlene Plater, M.D.    WBD/MEDQ  D:  09/21/2003  T:  09/21/2003  Job:  [id number]

## 2010-12-07 ENCOUNTER — Encounter (INDEPENDENT_AMBULATORY_CARE_PROVIDER_SITE_OTHER): Payer: Self-pay | Admitting: General Surgery

## 2011-03-15 ENCOUNTER — Encounter (INDEPENDENT_AMBULATORY_CARE_PROVIDER_SITE_OTHER): Payer: Self-pay | Admitting: General Surgery

## 2011-03-15 MED ORDER — LEVOTHYROXINE SODIUM 125 MCG PO TABS: 125.0000 ug | ORAL_TABLET | Freq: Every day | ORAL | Status: DC

## 2011-04-01 ENCOUNTER — Encounter (INDEPENDENT_AMBULATORY_CARE_PROVIDER_SITE_OTHER): Payer: Self-pay | Admitting: General Surgery

## 2011-04-06 ENCOUNTER — Other Ambulatory Visit (INDEPENDENT_AMBULATORY_CARE_PROVIDER_SITE_OTHER): Payer: Self-pay | Admitting: General Surgery

## 2011-04-06 ENCOUNTER — Ambulatory Visit (INDEPENDENT_AMBULATORY_CARE_PROVIDER_SITE_OTHER): Payer: Self-pay | Admitting: General Surgery

## 2011-04-06 ENCOUNTER — Ambulatory Visit (INDEPENDENT_AMBULATORY_CARE_PROVIDER_SITE_OTHER): Payer: BC Managed Care – PPO | Admitting: General Surgery

## 2011-04-06 ENCOUNTER — Encounter (INDEPENDENT_AMBULATORY_CARE_PROVIDER_SITE_OTHER): Payer: Self-pay | Admitting: General Surgery

## 2011-04-06 VITALS — BP 176/92 | HR 80 | Temp 97.7°F | Resp 16 | Ht 68.0 in | Wt 265.2 lb

## 2011-04-06 DIAGNOSIS — E039 Hypothyroidism, unspecified: Secondary | ICD-10-CM

## 2011-04-07 LAB — TSH: TSH: 108.823 u[IU]/mL — ABNORMAL HIGH (ref 0.350–4.500)

## 2011-04-07 NOTE — Progress Notes (Signed)
This encounter was created in error - please disregard.

## 2011-04-07 NOTE — Progress Notes (Signed)
Quick Note:  Brenda [name] needs to go up on her Synthroid. She needs to double her synthroid to 250 mcg qday. ______

## 2011-04-09 ENCOUNTER — Encounter (HOSPITAL_BASED_OUTPATIENT_CLINIC_OR_DEPARTMENT_OTHER): Payer: BC Managed Care – PPO | Admitting: Oncology

## 2011-04-09 ENCOUNTER — Other Ambulatory Visit: Payer: Self-pay | Admitting: Oncology

## 2011-04-09 ENCOUNTER — Encounter: Payer: Self-pay | Admitting: Internal Medicine

## 2011-04-09 DIAGNOSIS — E041 Nontoxic single thyroid nodule: Secondary | ICD-10-CM

## 2011-04-09 DIAGNOSIS — C189 Malignant neoplasm of colon, unspecified: Secondary | ICD-10-CM

## 2011-04-09 DIAGNOSIS — D649 Anemia, unspecified: Secondary | ICD-10-CM

## 2011-04-09 LAB — CBC WITH DIFFERENTIAL/PLATELET
BASO%: 0.2 % (ref 0.0–2.0)
Basophils Absolute: 0 10*3/uL (ref 0.0–0.1)
EOS%: 0.8 % (ref 0.0–7.0)
HCT: 35.2 % (ref 34.8–46.6)
HGB: 11.7 g/dL (ref 11.6–15.9)
MCH: 27.1 pg (ref 25.1–34.0)
MONO#: 0.7 10*3/uL (ref 0.1–0.9)
NEUT%: 66.4 % (ref 38.4–76.8)
RDW: 15.9 % — ABNORMAL HIGH (ref 11.2–14.5)
WBC: 8.8 10*3/uL (ref 3.9–10.3)
lymph#: 2.2 10*3/uL (ref 0.9–3.3)

## 2011-04-23 ENCOUNTER — Encounter: Payer: BC Managed Care – PPO | Admitting: Oncology

## 2011-04-27 NOTE — Progress Notes (Signed)
See dictation

## 2011-04-30 ENCOUNTER — Telehealth: Payer: Self-pay | Admitting: *Deleted

## 2011-04-30 ENCOUNTER — Ambulatory Visit (AMBULATORY_SURGERY_CENTER): Payer: BC Managed Care – PPO | Admitting: *Deleted

## 2011-04-30 VITALS — Ht 68.0 in | Wt 262.7 lb

## 2011-04-30 DIAGNOSIS — Z85038 Personal history of other malignant neoplasm of large intestine: Secondary | ICD-10-CM

## 2011-04-30 MED ORDER — PEG-KCL-NACL-NASULF-NA ASC-C 100 G PO SOLR: ORAL | Status: DC

## 2011-04-30 NOTE — Telephone Encounter (Signed)
PATIENT HERE FOR PREVISIT, HAS COLONOSCOPY APPOINTMENT ON 05-14-11. PATIENT HAS HAD PREVIOUS COLONOSCOPY WITH DR.MANN, HISTORY OF COLON CANCER. PT HAD RIGHT COLON RESECTION ON FEB. 22,2011. DR.SHERRILL REFERRED PATIENT TO HAVE COLONOSCOPY IN 1 YEAR AFTER HER COLON SURGERY. PATIENT IS HERE FOR COLONOSCOPY BECAUSE SHE STATES DR.MANN WILL NOT SEE HER BECAUSE SHE HAS $1000 BILL FROM DR.MANN THAT PATIENT SAYS SHE DID NOT EVEN KNOW ABOUT.  PATIENT DENIES HAVING CHEMO OR RADIATION FOR THE COLON CANCER. PATIENT SIGNED RELEASE OF INFORMATION FORM AND THIS WAS COMPLETED AND GIVEN TO PATTY LEWIS,CMA. THANKS.

## 2011-04-30 NOTE — Telephone Encounter (Signed)
Release faxed

## 2011-05-14 ENCOUNTER — Encounter: Payer: Self-pay | Admitting: Internal Medicine

## 2011-05-14 ENCOUNTER — Ambulatory Visit (AMBULATORY_SURGERY_CENTER): Payer: BC Managed Care – PPO | Admitting: Internal Medicine

## 2011-05-14 DIAGNOSIS — Z85038 Personal history of other malignant neoplasm of large intestine: Secondary | ICD-10-CM

## 2011-05-14 DIAGNOSIS — D126 Benign neoplasm of colon, unspecified: Secondary | ICD-10-CM

## 2011-05-14 HISTORY — PX: COLONOSCOPY: SHX174

## 2011-05-14 MED ORDER — SODIUM CHLORIDE 0.9 % IV SOLN: 500.0000 mL | INTRAVENOUS | Status: DC

## 2011-05-14 NOTE — Patient Instructions (Signed)
Please follow discharge instructions given today. Resume current medications. Colon polyps x2 removed and sent to lab, you will receive result letter in your mail in 1-2 weeks. Repeat colonoscopy in 3 years. Look over handouts given on polyps and hemorrhoids. Call us with any questions or concerns. Thank you!!

## 2011-05-17 ENCOUNTER — Telehealth: Payer: Self-pay | Admitting: *Deleted

## 2011-05-17 LAB — GLUCOSE, CAPILLARY: Glucose-Capillary: 125 mg/dL — ABNORMAL HIGH (ref 70–99)

## 2011-05-17 NOTE — Telephone Encounter (Signed)
No id on machine, no message left  

## 2011-05-23 ENCOUNTER — Encounter: Payer: Self-pay | Admitting: Internal Medicine

## 2011-06-24 ENCOUNTER — Telehealth: Payer: Self-pay | Admitting: Genetic Counselor

## 2011-07-02 ENCOUNTER — Other Ambulatory Visit: Payer: BC Managed Care – PPO | Admitting: Lab

## 2011-07-17 IMAGING — US US BIOPSY
1 series · 13 of 14 positions shown · non-contrast
Comparison: Ultrasound performed 07/03/2010 which revealed a 2.6 cm
right thyroid nodule and a left thyroid mass involving most of the
thyroid.

CLINICAL DATA: History of colon cancer with palpable thyroid
nodules.  Request has been made for bilateral thyroid nodule fine
needle aspiration.

ULTRASOUND GUIDED NEEDLE ASPIRATE BIOPSY OF THE THYROID GLAND

[Series 1: us biopsy · 0.07mm/px · 13 of 14 slices shown]
[im 1/14]
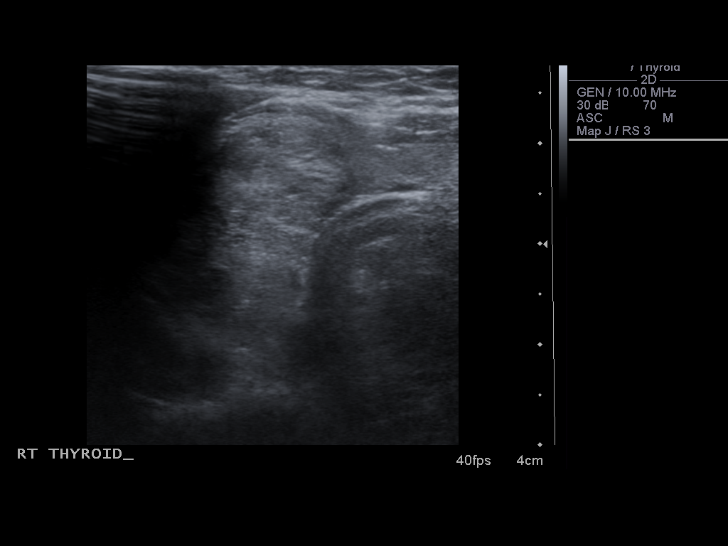
[im 2/14]
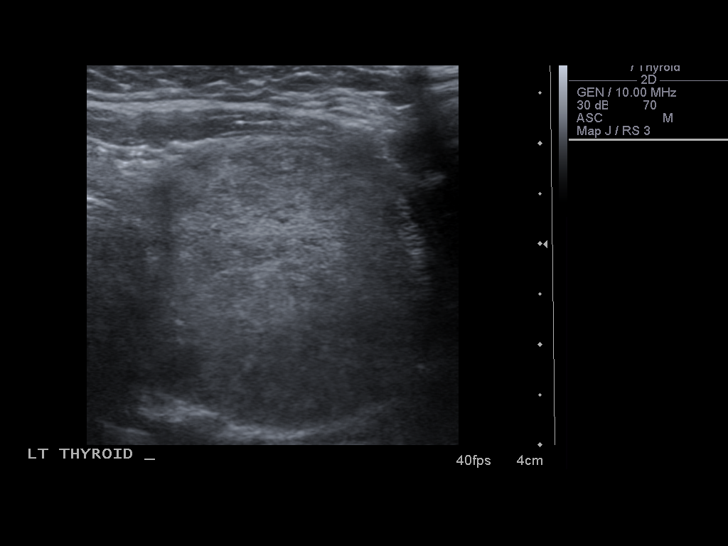
[im 3/14]
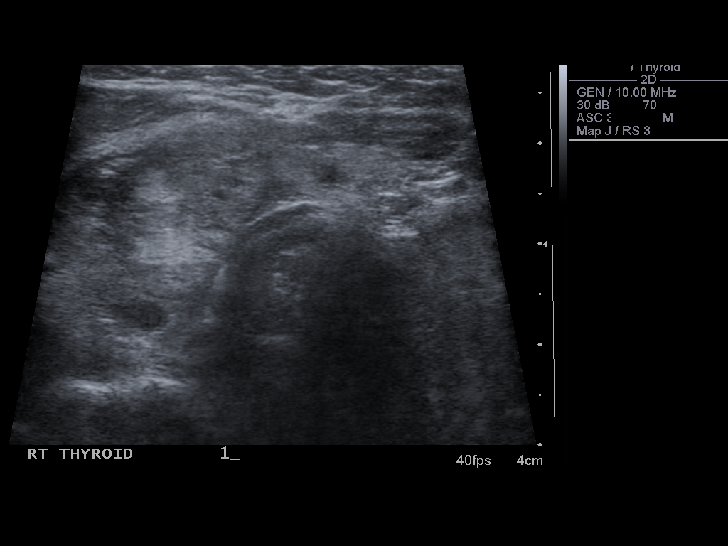
[im 4/14]
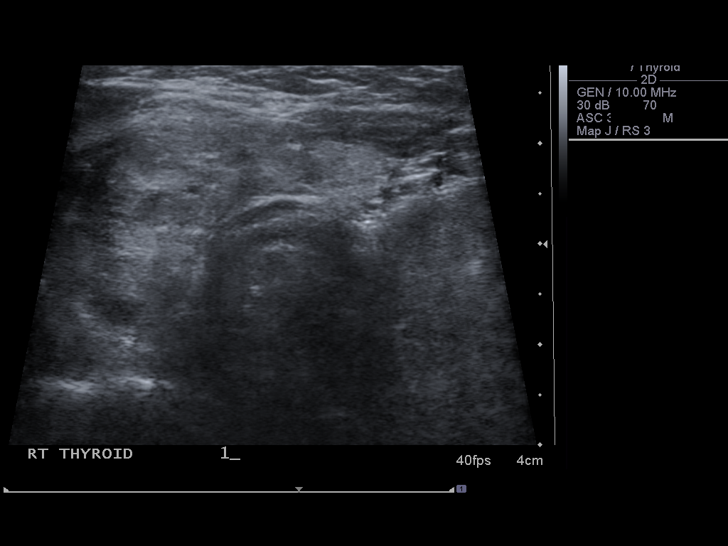
[im 5/14]
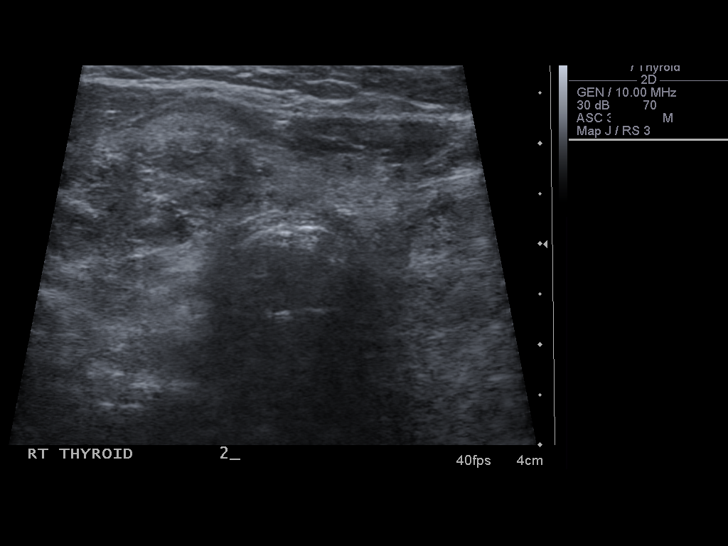
[im 6/14]
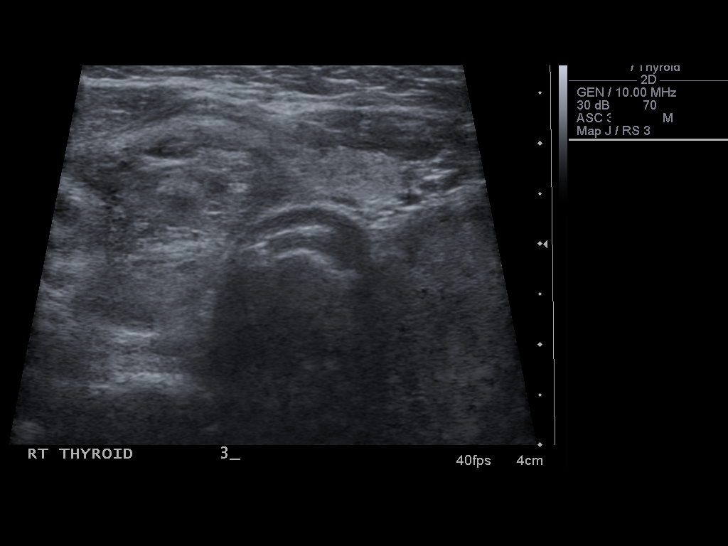
[im 8/14]
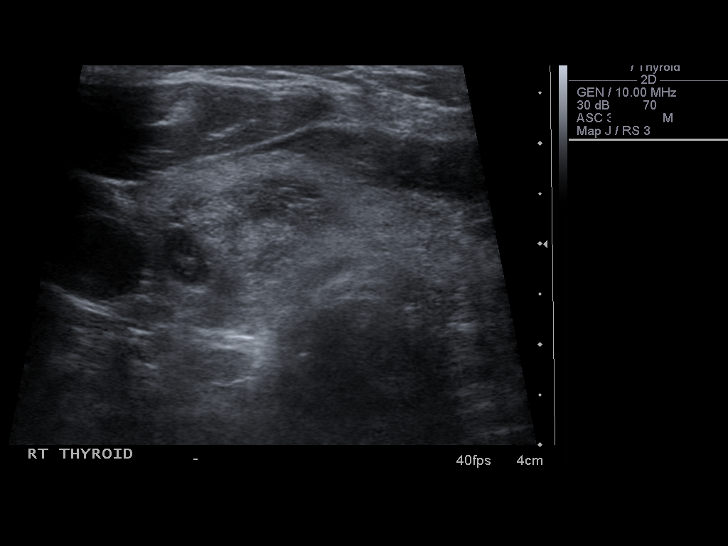
[im 9/14]
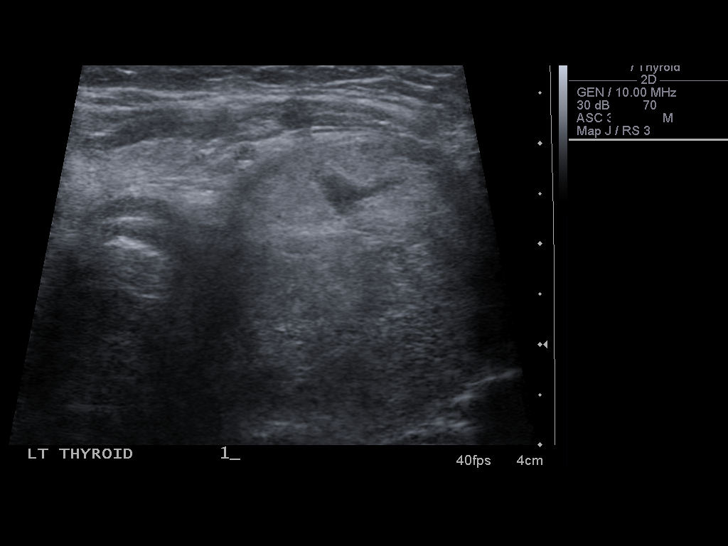
[im 10/14]
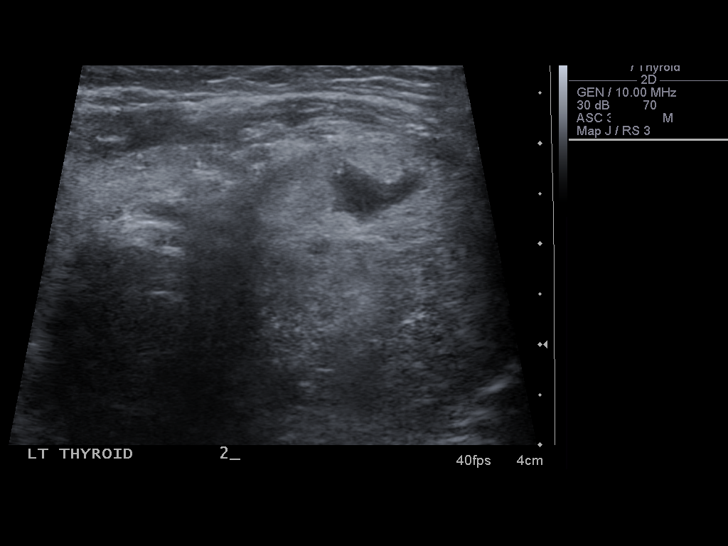
[im 11/14]
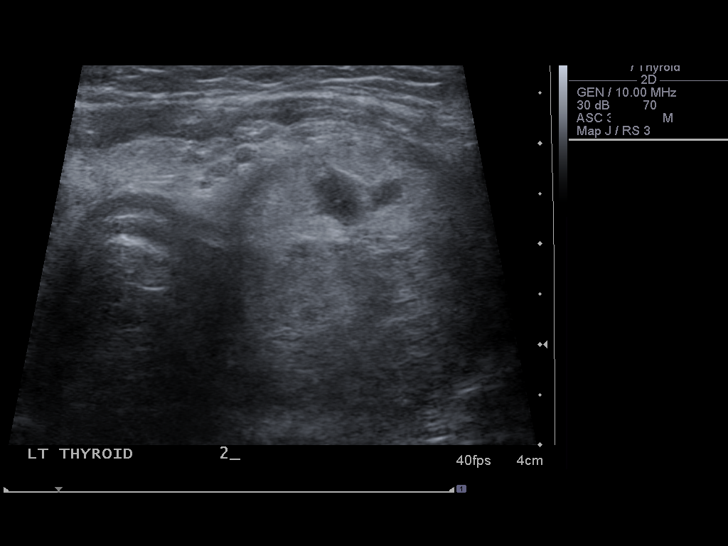
[im 12/14]
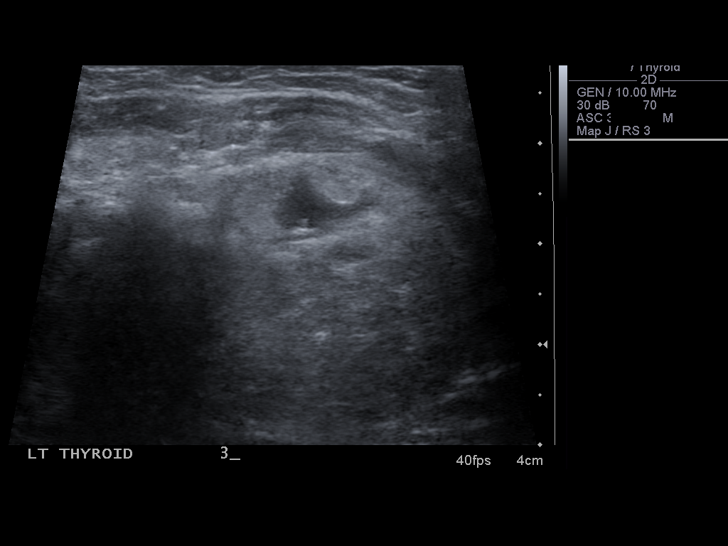
[im 13/14]
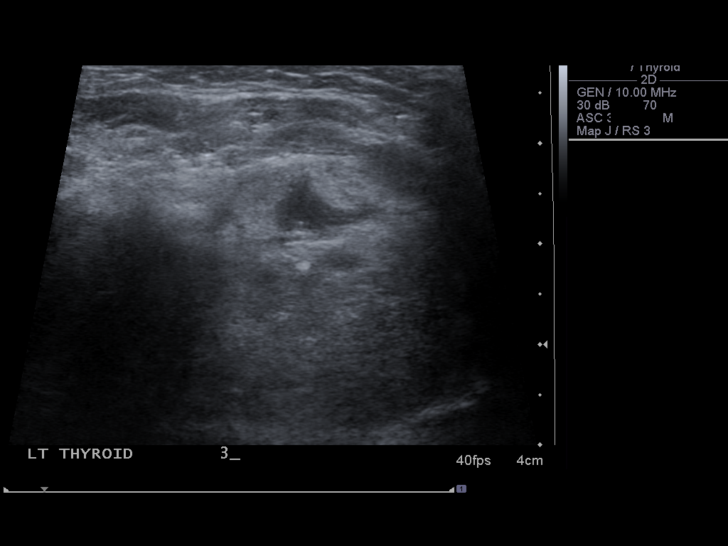
[im 14/14]
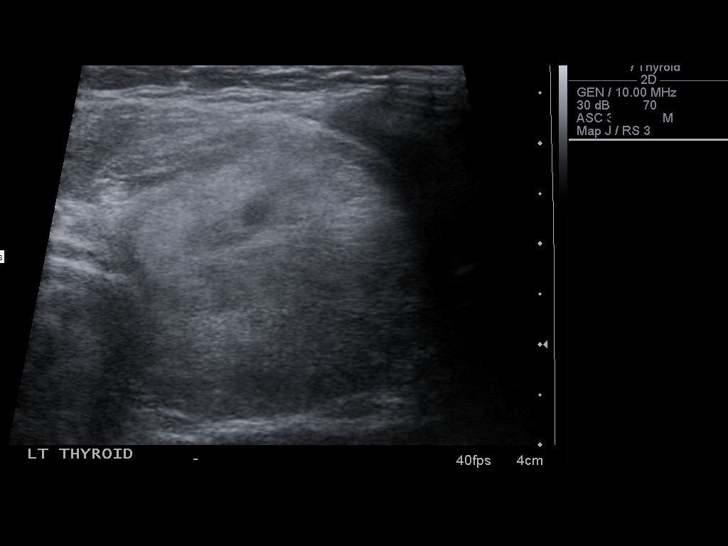

[13 of 14 positions shown; findings below may reference images not displayed]

Thyroid biopsy was thoroughly discussed with the patient and
questions were answered.  The benefits, risks, alternatives, and
complications were also discussed.  The patient understands and
wishes to proceed with the procedure.  Written consent was
obtained.

Ultrasound was performed to localize and mark an adequate site for
the biopsy.  The patient was then prepped and draped in a normal
sterile fashion.  Local anesthesia was provided with 1% lidocaine.
Using direct ultrasound guidance, three passes were made using 25
gauge needles into the nodule within the right lobe of the thyroid.
Ultrasound was used to confirm needle placements on all occasions.

Local anesthesia was provided with 1% lidocaine over the left
thyroid area.  Using direct ultrasound guidance, three passes were
made using 25 gauge needles into the mass involving the left
thyroid.  Ultrasound was used confirm needle placement on all
occasions.

Specimens were sent to Pathology for analysis.

Complications:  None
IMPRESSION: Ultrasound guided needle aspirate biopsy performed of the right and
left thyroid nodules.

Read by: Running, Yasmeen.-KAIREDINE

## 2011-09-13 ENCOUNTER — Telehealth: Payer: Self-pay | Admitting: Oncology

## 2011-09-13 NOTE — Telephone Encounter (Signed)
lmonvm adviisng the pt of her April 2013 appts with dr Truett Perna

## 2011-10-28 ENCOUNTER — Telehealth: Payer: Self-pay | Admitting: Oncology

## 2011-10-28 ENCOUNTER — Other Ambulatory Visit (HOSPITAL_BASED_OUTPATIENT_CLINIC_OR_DEPARTMENT_OTHER): Payer: 59 | Admitting: Lab

## 2011-10-28 ENCOUNTER — Ambulatory Visit (HOSPITAL_BASED_OUTPATIENT_CLINIC_OR_DEPARTMENT_OTHER): Payer: 59 | Admitting: Oncology

## 2011-10-28 VITALS — BP 189/95 | HR 61 | Temp 98.2°F | Ht 68.0 in | Wt 268.4 lb

## 2011-10-28 DIAGNOSIS — C189 Malignant neoplasm of colon, unspecified: Secondary | ICD-10-CM

## 2011-10-28 DIAGNOSIS — D649 Anemia, unspecified: Secondary | ICD-10-CM

## 2011-10-28 DIAGNOSIS — E041 Nontoxic single thyroid nodule: Secondary | ICD-10-CM

## 2011-10-28 LAB — CBC WITH DIFFERENTIAL/PLATELET
BASO%: 0.7 % (ref 0.0–2.0)
Eosinophils Absolute: 0.1 10*3/uL (ref 0.0–0.5)
HCT: 35.9 % (ref 34.8–46.6)
MCHC: 33.1 g/dL (ref 31.5–36.0)
MONO#: 0.5 10*3/uL (ref 0.1–0.9)
NEUT#: 4.3 10*3/uL (ref 1.5–6.5)
RBC: 4.36 10*6/uL (ref 3.70–5.45)
WBC: 6.7 10*3/uL (ref 3.9–10.3)
lymph#: 1.8 10*3/uL (ref 0.9–3.3)
nRBC: 0 % (ref 0–0)

## 2011-10-28 NOTE — Progress Notes (Signed)
OFFICE PROGRESS NOTE   INTERVAL HISTORY:   She returns as scheduled. She has persistent discomfort at the right inferior posterior lateral chest. No other symptoms.  Objective:  Vital signs in last 24 hours:  Blood pressure 189/95, pulse 61, temperature 98.2 F (36.8 C), temperature source Oral, height 5\' 8"  (1.727 m), weight 268 lb 6.4 oz (121.745 kg).    HEENT: Neck without mass Lymphatics: No cervical, supraclavicular, axillary, or inguinal nodes Resp: Lungs clear bilaterally Cardio: Regular rate and rhythm GI: No hepatomegaly, no mass, nontender Vascular: No leg edema  Musculoskeletal: There is tenderness at the right lower posterior chest wall overlying the lower ribs. No rash, no mass.   Lab Results:  Lab Results  Component Value Date   WBC 6.7 10/28/2011   HGB 11.9 10/28/2011   HCT 35.9 10/28/2011   MCV 82.3 10/28/2011   PLT 202 10/28/2011   ANC 4.3    Medications: I have reviewed the patient's current medications.  Assessment/Plan: 1. Stage I (T2 N0) right colon cancer status post right colectomy February 2011.  She declined enrollment on the NSABP-5 study. 2. History of multiple colonic polyps. She underwent a colonoscopy with removal of sigmoid colon polyps in October of 2012 3. Family history of cancer including colon, breast and prostate.  She was referred to the genetics screening clinic, but reports not undergoing testing due to the insurance issues. We will again refer her to the genetics clinic. 4. History of microcytic anemia.  The hemoglobin and MCV remain in the low normal range. 5. Right posterior lateral chest discomfort-  she will followup with her primary physician for evaluation of this pain 6. Diabetes. 7. Hypertension. 8.    Enlargement of the thyroid with asymmetric left-sided thyroid enlargement with a low attenuation nodule noted on CT August 20, 2009.  She underwent a thyroidectomy in February 2012.  Disposition:  She remains in remission  from colon cancer. She would like to continue followup cancer Center. She will return for an office visit in one year. She will followup with her primary physician for evaluation of the right low chest wall discomfort.   Lucile Shutters, MD  10/28/2011  10:02 AM

## 2011-10-28 NOTE — Telephone Encounter (Signed)
Gv pt appt for april2014 

## 2011-11-12 ENCOUNTER — Telehealth: Payer: Self-pay | Admitting: Oncology

## 2011-11-12 NOTE — Telephone Encounter (Signed)
lmonvm for pt re calling me for genetics appt. 

## 2012-01-24 ENCOUNTER — Encounter (INDEPENDENT_AMBULATORY_CARE_PROVIDER_SITE_OTHER): Payer: Self-pay | Admitting: General Surgery

## 2012-03-21 ENCOUNTER — Ambulatory Visit (INDEPENDENT_AMBULATORY_CARE_PROVIDER_SITE_OTHER): Payer: Self-pay | Admitting: General Surgery

## 2012-04-21 ENCOUNTER — Other Ambulatory Visit: Payer: Self-pay | Admitting: Internal Medicine

## 2012-04-21 DIAGNOSIS — K3189 Other diseases of stomach and duodenum: Secondary | ICD-10-CM

## 2012-04-28 ENCOUNTER — Other Ambulatory Visit: Payer: 59

## 2012-05-01 ENCOUNTER — Encounter (INDEPENDENT_AMBULATORY_CARE_PROVIDER_SITE_OTHER): Payer: Self-pay | Admitting: General Surgery

## 2012-05-01 ENCOUNTER — Ambulatory Visit (INDEPENDENT_AMBULATORY_CARE_PROVIDER_SITE_OTHER): Payer: 59 | Admitting: General Surgery

## 2012-05-01 VITALS — BP 142/90 | HR 78 | Temp 99.2°F | Resp 18 | Ht 69.0 in | Wt 267.4 lb

## 2012-05-01 DIAGNOSIS — R1011 Right upper quadrant pain: Secondary | ICD-10-CM

## 2012-05-01 DIAGNOSIS — D34 Benign neoplasm of thyroid gland: Secondary | ICD-10-CM

## 2012-05-01 DIAGNOSIS — E039 Hypothyroidism, unspecified: Secondary | ICD-10-CM

## 2012-05-01 DIAGNOSIS — C189 Malignant neoplasm of colon, unspecified: Secondary | ICD-10-CM

## 2012-05-01 NOTE — Assessment & Plan Note (Signed)
Pt is up to date with colonoscopy.   Abd pain to be evaluated with U/S. Will need CEA drawn.

## 2012-05-01 NOTE — Assessment & Plan Note (Signed)
TSH ordered by PCP and followed.

## 2012-05-01 NOTE — Patient Instructions (Addendum)
Follow up in 1 year unless gallbladder ultrasound is positive.  If pain worsens and no cause can be found, come back earlier.

## 2012-05-02 NOTE — Progress Notes (Signed)
HISTORY: Patient is a 51 year old female who is now approximately one year and 10 months status post right hemicolectomy for colon cancer. This was done by Dr. Dwain Sarna. A year later she had to thyroid nodules and required thyroidectomy.  She is doing well from both those disease standpoints.  She has had her colonoscopy and is supposed to get another one in 3 years.   However, she has been having RUQ pain and nausea that can occur anytime, but is worse after eating.  She is scheduled to get RUQ u/s this week to evaluate for gallbladder disease.    PERTINENT REVIEW OF SYSTEMS: See HPI, otherwise negative x 11   EXAM: Head: Normocephalic and atraumatic.  Eyes:  Conjunctivae are normal. Pupils are equal, round, and reactive to light. No scleral icterus.  Neck:  Normal range of motion. Neck supple. No tracheal deviation present. No cervical lymphadenopathy.  Resp: No respiratory distress, normal effort. Abd:  Abdomen is soft, non distended.  Mildly tender in RUQ. No masses are palpable.  There is no rebound and no guarding.  Neurological: Alert and oriented to person, place, and time. Coordination normal.  Skin: Skin is warm and dry. No rash noted. No diaphoretic. No erythema. No pallor.  Psychiatric: Normal mood and affect. Normal behavior. Judgment and thought content normal.      ASSESSMENT AND PLAN:   Colon cancer Pt is up to date with colonoscopy.   Abd pain to be evaluated with U/S. Will need CEA drawn.    Bilateral follicular adenomas of thyroid gland TSH ordered by PCP and followed.    RUQ pain.  Pt in evaluation for gallstones.  Briefly discussed surgery.  Advised that even if pt does not have gallstones, she may still have gallbladder pathology.    Maudry Diego, MD Surgical Oncology, General & Endocrine Surgery [facility]  Devra Dopp, MD Devra Dopp, MD

## 2012-05-05 ENCOUNTER — Other Ambulatory Visit: Payer: 59

## 2012-05-10 ENCOUNTER — Ambulatory Visit
Admission: RE | Admit: 2012-05-10 | Discharge: 2012-05-10 | Disposition: A | Discharge status: Home / Self Care | Payer: 59 | Source: Ambulatory Visit | Attending: Internal Medicine | Admitting: Internal Medicine

## 2012-05-10 DIAGNOSIS — K3189 Other diseases of stomach and duodenum: Secondary | ICD-10-CM

## 2012-05-10 DIAGNOSIS — R1013 Epigastric pain: Secondary | ICD-10-CM

## 2012-10-26 ENCOUNTER — Telehealth: Payer: Self-pay | Admitting: *Deleted

## 2012-10-26 NOTE — Telephone Encounter (Signed)
Patient called and canceled her appt for tomorrow. She stated that she will call back and reschedule. Message left for desk RN.  JMW

## 2012-10-27 ENCOUNTER — Ambulatory Visit: Payer: 59 | Admitting: Nurse Practitioner

## 2013-11-09 NOTE — Telephone Encounter (Signed)
Please see Visit Info comments 

## 2014-02-15 ENCOUNTER — Encounter: Payer: Self-pay | Admitting: *Deleted

## 2014-03-29 ENCOUNTER — Encounter: Payer: Self-pay | Admitting: Internal Medicine

## 2014-09-18 ENCOUNTER — Encounter: Payer: Self-pay | Admitting: Internal Medicine

## 2015-06-13 ENCOUNTER — Encounter (HOSPITAL_COMMUNITY): Payer: Self-pay

## 2015-06-13 ENCOUNTER — Emergency Department (HOSPITAL_COMMUNITY)
Admission: EM | Admit: 2015-06-13 | Discharge: 2015-06-14 | Disposition: A | Discharge status: Home / Self Care | Payer: Self-pay | Attending: Emergency Medicine | Admitting: Emergency Medicine

## 2015-06-13 DIAGNOSIS — E079 Disorder of thyroid, unspecified: Secondary | ICD-10-CM | POA: Insufficient documentation

## 2015-06-13 DIAGNOSIS — Z8719 Personal history of other diseases of the digestive system: Secondary | ICD-10-CM | POA: Insufficient documentation

## 2015-06-13 DIAGNOSIS — Z7984 Long term (current) use of oral hypoglycemic drugs: Secondary | ICD-10-CM | POA: Insufficient documentation

## 2015-06-13 DIAGNOSIS — Z85038 Personal history of other malignant neoplasm of large intestine: Secondary | ICD-10-CM | POA: Insufficient documentation

## 2015-06-13 DIAGNOSIS — Z87891 Personal history of nicotine dependence: Secondary | ICD-10-CM | POA: Insufficient documentation

## 2015-06-13 DIAGNOSIS — E119 Type 2 diabetes mellitus without complications: Secondary | ICD-10-CM | POA: Insufficient documentation

## 2015-06-13 DIAGNOSIS — I10 Essential (primary) hypertension: Secondary | ICD-10-CM | POA: Insufficient documentation

## 2015-06-13 DIAGNOSIS — Z8739 Personal history of other diseases of the musculoskeletal system and connective tissue: Secondary | ICD-10-CM | POA: Insufficient documentation

## 2015-06-13 DIAGNOSIS — B372 Candidiasis of skin and nail: Secondary | ICD-10-CM | POA: Insufficient documentation

## 2015-06-13 DIAGNOSIS — E785 Hyperlipidemia, unspecified: Secondary | ICD-10-CM | POA: Insufficient documentation

## 2015-06-13 DIAGNOSIS — Z79899 Other long term (current) drug therapy: Secondary | ICD-10-CM | POA: Insufficient documentation

## 2015-06-13 MED ORDER — DIPHENHYDRAMINE HCL 25 MG PO CAPS
25.0000 mg | ORAL_CAPSULE | Freq: Once | ORAL | Status: AC
  Administered 2015-06-13: 25 mg via ORAL
  Filled 2015-06-13: qty 1

## 2015-06-13 NOTE — ED Notes (Signed)
Pt here for vaginal rash since finishing abx, sts she googled and it looks like shingles but it does cross midline.

## 2015-06-13 NOTE — ED Provider Notes (Signed)
CSN: [id number]     Arrival date & time 06/13/15  2229 History  By signing my name below, I, [name], attest that this documentation has been prepared under the direction and in the presence of Gloriann Loan, PA-C. Electronically Signed: [name], ED Scribe. 06/14/2015. 1:00 AM. .    Chief Complaint  Patient presents with  . Rash   The history is provided by the patient. No language interpreter was used.    HPI Comments: Brenda [name] is a 54 y.o. female with a h/o DM who presents to the Emergency Department complaining of an intermittent, red and painful, rash to her pannus onset 2 weeks. She states it is worse along her bikini line. Pt reports mild dysuria, increased frequency, mild suprapubic abdominal pain,  and non odorous, clear vaginal discharge. Pt notes she has never had anything like this before. She denies any aggravating factors. Pt notes she applied hydrocortisone cream and OTC abx ointment to the area with no relief. She notes that she was previously sexually active but is not at this time; she states she did not use protection with former partner. Pt denies any concern for STDs. She denies any issues with bowel movements. Pt denies any fever, chills, nausea, vomiting, diarrhea or hematuria.   Past Medical History  Diagnosis Date  . Diabetes mellitus   . Hypertension   . Night sweats   . Fatigue   . Hyperlipidemia   . Headache(784.0)   . Hemorrhoid   . Wears glasses   . Arthritis   . Thyroid disease   . Colon cancer ([facility]) 09/08/09    Colonoscopy by Dr Collene Mares; Surgery by Dr Donne Hazel  . Family history of breast cancer     aunt and cousin on father's side  . Family history of colon cancer     grandmother - father's side  . Family history of prostate cancer     uncle - (father's brother)   Past Surgical History  Procedure Laterality Date  . Abdominal hysterectomy    . Appendectomy    . Tubal ligation    . Thyroid surgery  09/02/10    Clarify with patient;  BENIGN PER PT  . Right colectomy  09-09-09    FOR COLON CANCER  . Cyst removed      BACK OF HEAD   Family History  Problem Relation Age of Onset  . Diabetes Father   . Heart disease Father   . Hypertension Father   . Kidney disease Father   . Colon cancer Paternal Grandmother 71   Social History  Substance Use Topics  . Smoking status: Former Smoker    Quit date: 04/05/1981  . Smokeless tobacco: Never Used  . Alcohol Use: No   OB History    No data available     Review of Systems  All other systems negative unless otherwise stated in HPI     Allergies  Review of patient's allergies indicates no known allergies.  Home Medications   Prior to Admission medications   Medication Sig Start Date End Date Taking? Authorizing Provider  DIOVAN HCT 320-25 MG per tablet Take 1 tablet by mouth Daily. 04/20/11   Historical Provider, MD  diphenhydrAMINE (BENADRYL) 25 mg capsule Take 1 capsule (25 mg total) by mouth every 6 (six) hours as needed. 06/14/15   Gloriann Loan, PA-C  hydrocortisone cream 1 % Apply to affected area 2 times daily 06/14/15   Gloriann Loan, PA-C  levothyroxine (SYNTHROID) 125 MCG tablet  Take 1 tablet (125 mcg total) by mouth daily. 03/15/11 03/14/12  Stark Klein, MD  metFORMIN (GLUCOPHAGE) 500 MG tablet Take 500 mg by mouth daily.      Historical Provider, MD  metoprolol (TOPROL-XL) 50 MG 24 hr tablet Take 1 tablet by mouth Daily. 04/20/11   Historical Provider, MD  nystatin cream (MYCOSTATIN) Apply to affected area 2 times daily 06/14/15   Gloriann Loan, PA-C  Omega-3 Fatty Acids (FISH OIL PO) Take 1,000 mg by mouth 2 (two) times daily. 2 tab 2 x day     Historical Provider, MD  simvastatin (ZOCOR) 10 MG tablet Take 10 mg by mouth at bedtime.    Historical Provider, MD   BP 158/83 mmHg  Pulse 78  Temp(Src) 98 F (36.7 C) (Oral)  Resp 18  Ht 5\' 8"  (1.727 m)  Wt 110.224 kg  BMI 36.96 kg/m2  SpO2 95% Physical Exam  Constitutional: She is oriented to person, place,  and time. She appears well-developed and well-nourished.  HENT:  Head: Normocephalic and atraumatic.  Mouth/Throat: Oropharynx is clear and moist.  Eyes: Conjunctivae are normal.  Neck: Normal range of motion. Neck supple.  Cardiovascular: Normal rate, regular rhythm and normal heart sounds.   No murmur heard. Pulmonary/Chest: Effort normal and breath sounds normal. No accessory muscle usage or stridor. No respiratory distress. She has no wheezes. She has no rhonchi. She has no rales.  Abdominal: Soft. Bowel sounds are normal. She exhibits no distension. There is no tenderness. There is no rebound and no guarding.  Genitourinary: Uterus normal. There is no rash or tenderness on the right labia. There is no rash or tenderness on the left labia. Cervix exhibits discharge (white chunky). Cervix exhibits no motion tenderness. Right adnexum displays tenderness (mild). Left adnexum displays tenderness (mild). Vaginal discharge found.  Musculoskeletal: Normal range of motion.  Lymphadenopathy:    She has no cervical adenopathy.  Neurological: She is alert and oriented to person, place, and time.  Speech clear without dysarthria.  Skin: Skin is warm, dry and intact. Rash (consistent with candidal intertrigo along pannus and groin) noted. No abrasion, no bruising, no burn, no ecchymosis, no laceration, no petechiae and no purpura noted. Rash is maculopapular. Rash is not nodular, not pustular, not vesicular and not urticarial. There is erythema.  Psychiatric: She has a normal mood and affect. Her behavior is normal.    ED Course  Procedures (including critical care time) Labs Review Labs Reviewed  WET PREP, GENITAL - Abnormal; Notable for the following:    Clue Cells Wet Prep HPF POC FEW (*)    WBC, Wet Prep HPF POC MODERATE (*)    All other components within normal limits  URINALYSIS, ROUTINE W REFLEX MICROSCOPIC (NOT AT [facility]) - Abnormal; Notable for the following:    APPearance CLOUDY (*)     Specific Gravity, Urine 1.036 (*)    Glucose, UA >1000 (*)    Leukocytes, UA SMALL (*)    All other components within normal limits  URINE MICROSCOPIC-ADD ON - Abnormal; Notable for the following:    Squamous Epithelial / LPF 0-5 (*)    Bacteria, UA RARE (*)    All other components within normal limits  HIV ANTIBODY (ROUTINE TESTING)  RPR  GC/CHLAMYDIA PROBE AMP ([place]) NOT AT [facility]    Imaging Review No results found. I have personally reviewed and evaluated these images and lab results as part of my medical decision-making.   EKG Interpretation None  MDM   Final diagnoses:  Candidal intertrigo    Patient presents with rash on her pannus and dysuria.  VSS, NAD, patient appears non-toxic.  On exam, maculopapular rash along pannus and groin consistent with candidal intertrigo.  Heart RRR, lungs CTAB, abdomen soft and non-tender.  GU exam, cervix displays moderate white chunky discharge.  No CMT or adnexal tenderness.  Will obtain wet prep, GC/chlamydia, HIV/RPR, and UA.  Labs unremarkable.  Will give nystatin, hydrocortisone, and benadryl.  Discussed return precautions.  Follow up PCP.  Patient agrees and acknowledges the above plan for discharge.     Gloriann Loan, PA-C 06/14/15 0100  Jola Schmidt, MD 06/14/15 [phone]

## 2015-06-14 LAB — URINE MICROSCOPIC-ADD ON: RBC / HPF: NONE SEEN RBC/hpf (ref 0–5)

## 2015-06-14 LAB — URINALYSIS, ROUTINE W REFLEX MICROSCOPIC
BILIRUBIN URINE: NEGATIVE
Glucose, UA: >1000 mg/dL — AB
HGB URINE DIPSTICK: NEGATIVE
KETONES UR: NEGATIVE mg/dL
Nitrite: NEGATIVE
PROTEIN: NEGATIVE mg/dL
Specific Gravity, Urine: 1.036 — ABNORMAL HIGH (ref 1.005–1.030)
pH: 5.5 (ref 5.0–8.0)

## 2015-06-14 LAB — WET PREP, GENITAL
SPERM: NONE SEEN
TRICH WET PREP: NONE SEEN
Yeast Wet Prep HPF POC: NONE SEEN

## 2015-06-14 LAB — HIV ANTIBODY (ROUTINE TESTING W REFLEX): HIV Screen 4th Generation wRfx: NONREACTIVE

## 2015-06-14 MED ORDER — HYDROCORTISONE 1 % EX CREA: TOPICAL_CREAM | CUTANEOUS | Status: DC

## 2015-06-14 MED ORDER — DIPHENHYDRAMINE HCL 25 MG PO CAPS: 25.0000 mg | ORAL_CAPSULE | Freq: Four times a day (QID) | ORAL | Status: DC | PRN

## 2015-06-14 MED ORDER — NYSTATIN 100000 UNIT/GM EX CREA: TOPICAL_CREAM | CUTANEOUS | Status: DC

## 2015-06-14 NOTE — ED Notes (Signed)
Pt ambulating independently w/ steady gait on d/c in no acute distress, A&Ox4. D/c instructions reviewed w/ pt and family - pt and family deny any further questions or concerns at present. Rx given x3  

## 2015-06-14 NOTE — Discharge Instructions (Signed)
Cutaneous Candidiasis °Cutaneous candidiasis is a condition in which there is an overgrowth of yeast (candida) on the skin. Yeast normally live on the skin, but in small enough numbers not to cause any symptoms. In certain cases, increased growth of the yeast may cause an actual yeast infection. This kind of infection usually occurs in areas of the skin that are constantly warm and moist, such as the armpits or the groin. Yeast is the most common cause of diaper rash in babies and in people who cannot control their bowel movements (incontinence). °CAUSES  °The fungus that most often causes cutaneous candidiasis is Candida albicans. Conditions that can increase the risk of getting a yeast infection of the skin include: °· Obesity. °· Pregnancy. °· Diabetes. °· Taking antibiotic medicine. °· Taking birth control pills. °· Taking steroid medicines. °· Thyroid disease. °· An iron or zinc deficiency. °· Problems with the immune system. °SYMPTOMS  °· Red, swollen area of the skin. °· Bumps on the skin. °· Itchiness. °DIAGNOSIS  °The diagnosis of cutaneous candidiasis is usually based on its appearance. Light scrapings of the skin may also be taken and viewed under a microscope to identify the presence of yeast. °TREATMENT  °Antifungal creams may be applied to the infected skin. In severe cases, oral medicines may be needed.  °HOME CARE INSTRUCTIONS  °· Keep your skin clean and dry. °· Maintain a healthy weight. °· If you have diabetes, keep your blood sugar under control. °SEEK IMMEDIATE MEDICAL CARE IF: °· Your rash continues to spread despite treatment. °· You have a fever, chills, or abdominal pain. °  °This information is not intended to replace advice given to you by your health care provider. Make sure you discuss any questions you have with your health care provider. °  °Document Released: 03/23/2011 Document Revised: 09/27/2011 Document Reviewed: 01/06/2015 °Elsevier Interactive Patient Education ©2016 Elsevier  Inc. ° °

## 2015-06-16 LAB — GC/CHLAMYDIA PROBE AMP (~~LOC~~) NOT AT ARMC
CHLAMYDIA, DNA PROBE: NEGATIVE
NEISSERIA GONORRHEA: NEGATIVE

## 2015-06-17 LAB — RPR: RPR Ser Ql: NONREACTIVE

## 2015-10-20 ENCOUNTER — Telehealth: Payer: Self-pay | Admitting: Oncology

## 2015-10-20 NOTE — Telephone Encounter (Signed)
Faxed records request to [facility] [facility] [place], [place] [phone] release id)

## 2016-05-29 ENCOUNTER — Emergency Department (HOSPITAL_COMMUNITY): Payer: Self-pay

## 2016-05-29 ENCOUNTER — Encounter (HOSPITAL_COMMUNITY): Payer: Self-pay | Admitting: Nurse Practitioner

## 2016-05-29 ENCOUNTER — Emergency Department (HOSPITAL_COMMUNITY)
Admission: EM | Admit: 2016-05-29 | Discharge: 2016-05-30 | Disposition: A | Discharge status: Home / Self Care | Payer: Self-pay | Attending: Emergency Medicine | Admitting: Emergency Medicine

## 2016-05-29 DIAGNOSIS — M549 Dorsalgia, unspecified: Secondary | ICD-10-CM | POA: Insufficient documentation

## 2016-05-29 DIAGNOSIS — I10 Essential (primary) hypertension: Secondary | ICD-10-CM | POA: Insufficient documentation

## 2016-05-29 DIAGNOSIS — E119 Type 2 diabetes mellitus without complications: Secondary | ICD-10-CM | POA: Insufficient documentation

## 2016-05-29 DIAGNOSIS — Z794 Long term (current) use of insulin: Secondary | ICD-10-CM | POA: Insufficient documentation

## 2016-05-29 DIAGNOSIS — Z7984 Long term (current) use of oral hypoglycemic drugs: Secondary | ICD-10-CM | POA: Insufficient documentation

## 2016-05-29 DIAGNOSIS — R7989 Other specified abnormal findings of blood chemistry: Secondary | ICD-10-CM

## 2016-05-29 DIAGNOSIS — R740 Nonspecific elevation of levels of transaminase and lactic acid dehydrogenase [LDH]: Secondary | ICD-10-CM | POA: Insufficient documentation

## 2016-05-29 DIAGNOSIS — Z85038 Personal history of other malignant neoplasm of large intestine: Secondary | ICD-10-CM | POA: Insufficient documentation

## 2016-05-29 DIAGNOSIS — R0682 Tachypnea, not elsewhere classified: Secondary | ICD-10-CM | POA: Insufficient documentation

## 2016-05-29 DIAGNOSIS — J40 Bronchitis, not specified as acute or chronic: Secondary | ICD-10-CM | POA: Insufficient documentation

## 2016-05-29 DIAGNOSIS — Z79899 Other long term (current) drug therapy: Secondary | ICD-10-CM | POA: Insufficient documentation

## 2016-05-29 DIAGNOSIS — E039 Hypothyroidism, unspecified: Secondary | ICD-10-CM | POA: Insufficient documentation

## 2016-05-29 DIAGNOSIS — Z87891 Personal history of nicotine dependence: Secondary | ICD-10-CM | POA: Insufficient documentation

## 2016-05-29 LAB — HEPATIC FUNCTION PANEL
ALBUMIN: 3.7 g/dL (ref 3.5–5.0)
ALT: 24 U/L (ref 14–54)
AST: 26 U/L (ref 15–41)
Alkaline Phosphatase: 90 U/L (ref 38–126)
BILIRUBIN DIRECT: 0.1 mg/dL (ref 0.1–0.5)
BILIRUBIN TOTAL: 0.8 mg/dL (ref 0.3–1.2)
Indirect Bilirubin: 0.7 mg/dL (ref 0.3–0.9)
Total Protein: 6.8 g/dL (ref 6.5–8.1)

## 2016-05-29 LAB — I-STAT CG4 LACTIC ACID, ED
LACTIC ACID, VENOUS: 3.14 mmol/L — AB (ref 0.5–1.9)
LACTIC ACID, VENOUS: 1.8 mmol/L (ref 0.5–1.9)
Lactic Acid, Venous: 3.52 mmol/L (ref 0.5–1.9)

## 2016-05-29 LAB — URINALYSIS, ROUTINE W REFLEX MICROSCOPIC
BILIRUBIN URINE: NEGATIVE
Glucose, UA: >1000 mg/dL — AB
Hgb urine dipstick: NEGATIVE
KETONES UR: 15 mg/dL — AB
LEUKOCYTES UA: NEGATIVE
NITRITE: NEGATIVE
PH: 6 (ref 5.0–8.0)
PROTEIN: NEGATIVE mg/dL
Specific Gravity, Urine: 1.031 — ABNORMAL HIGH (ref 1.005–1.030)

## 2016-05-29 LAB — CBC
HEMATOCRIT: 43.4 % (ref 36.0–46.0)
HEMOGLOBIN: 14.5 g/dL (ref 12.0–15.0)
MCH: 26.9 pg (ref 26.0–34.0)
MCHC: 33.4 g/dL (ref 30.0–36.0)
MCV: 80.4 fL (ref 78.0–100.0)
Platelets: 249 10*3/uL (ref 150–400)
RBC: 5.4 MIL/uL — AB (ref 3.87–5.11)
RDW: 14.2 % (ref 11.5–15.5)
WBC: 10.6 10*3/uL — ABNORMAL HIGH (ref 4.0–10.5)

## 2016-05-29 LAB — BASIC METABOLIC PANEL
ANION GAP: 14 (ref 5–15)
BUN: 13 mg/dL (ref 6–20)
CO2: 24 mmol/L (ref 22–32)
Calcium: 9.2 mg/dL (ref 8.9–10.3)
Chloride: 98 mmol/L — ABNORMAL LOW (ref 101–111)
Creatinine, Ser: 0.8 mg/dL (ref 0.44–1.00)
GFR calc non Af Amer: >60 mL/min (ref >60)
GLUCOSE: 380 mg/dL — AB (ref 65–99)
POTASSIUM: 3.6 mmol/L (ref 3.5–5.1)
Sodium: 136 mmol/L (ref 135–145)

## 2016-05-29 LAB — I-STAT TROPONIN, ED
Troponin i, poc: 0 ng/mL (ref 0.00–0.08)
Troponin i, poc: 0 ng/mL (ref 0.00–0.08)

## 2016-05-29 LAB — URINE MICROSCOPIC-ADD ON

## 2016-05-29 MED ORDER — CYCLOBENZAPRINE HCL 5 MG PO TABS: 5.0000 mg | ORAL_TABLET | Freq: Two times a day (BID) | ORAL | 0 refills | Status: DC | PRN

## 2016-05-29 MED ORDER — IPRATROPIUM-ALBUTEROL 0.5-2.5 (3) MG/3ML IN SOLN
3.0000 mL | Freq: Once | RESPIRATORY_TRACT | Status: AC
  Administered 2016-05-29: 3 mL via RESPIRATORY_TRACT
  Filled 2016-05-29: qty 3

## 2016-05-29 MED ORDER — DOXYCYCLINE HYCLATE 100 MG IV SOLR
100.0000 mg | Freq: Once | INTRAVENOUS | Status: AC
  Administered 2016-05-29: 100 mg via INTRAVENOUS
  Filled 2016-05-29: qty 100

## 2016-05-29 MED ORDER — CYCLOBENZAPRINE HCL 10 MG PO TABS
5.0000 mg | ORAL_TABLET | Freq: Once | ORAL | Status: AC
  Administered 2016-05-29: 5 mg via ORAL
  Filled 2016-05-29: qty 1

## 2016-05-29 MED ORDER — KETOROLAC TROMETHAMINE 30 MG/ML IJ SOLN
30.0000 mg | Freq: Once | INTRAMUSCULAR | Status: AC
  Administered 2016-05-29: 30 mg via INTRAVENOUS
  Filled 2016-05-29: qty 1

## 2016-05-29 MED ORDER — SODIUM CHLORIDE 0.9 % IV BOLUS (SEPSIS)
1000.0000 mL | Freq: Once | INTRAVENOUS | Status: AC
  Administered 2016-05-29: 1000 mL via INTRAVENOUS

## 2016-05-29 MED ORDER — MORPHINE SULFATE (PF) 4 MG/ML IV SOLN
4.0000 mg | Freq: Once | INTRAVENOUS | Status: DC
  Filled 2016-05-29: qty 1

## 2016-05-29 MED ORDER — HYDROCODONE-ACETAMINOPHEN 5-325 MG PO TABS: 1.0000 | ORAL_TABLET | Freq: Four times a day (QID) | ORAL | 0 refills | Status: DC | PRN

## 2016-05-29 MED ORDER — DOXYCYCLINE HYCLATE 100 MG PO CAPS: 100.0000 mg | ORAL_CAPSULE | Freq: Two times a day (BID) | ORAL | 0 refills | Status: DC

## 2016-05-29 MED ORDER — AMLODIPINE BESYLATE 5 MG PO TABS
10.0000 mg | ORAL_TABLET | Freq: Once | ORAL | Status: AC
  Administered 2016-05-29: 10 mg via ORAL
  Filled 2016-05-29: qty 2

## 2016-05-29 MED ORDER — IOPAMIDOL (ISOVUE-370) INJECTION 76%
INTRAVENOUS | Status: AC
  Administered 2016-05-29: 100 mL
  Filled 2016-05-29: qty 100

## 2016-05-29 NOTE — ED Notes (Signed)
Pt states she drove here tonight and has no one to pick her up. Notified Dr. Darl Householder and he said he would cancel morphine order and order alternative pain medication

## 2016-05-29 NOTE — ED Provider Notes (Signed)
[facility] DEPT Provider Note   CSN: [id number] Arrival date & time: 05/29/16  1726     History   Chief Complaint Chief Complaint  Patient presents with  . Chest Pain    HPI Brenda [name] is a 55 y.o. female hx of DM, HL, Who presented with chest pain, shortness of breath. Patient states that his been having intermittent shortness of breath for the last 2 weeks or so. Since yesterday, she's been having constant chest tightness. She has some cough and some low-grade temperature as well. She thought she has some reflux but tried drinking Coca-Cola and baking soda with no relief. Denies any abdominal pain or vomiting.   The history is provided by the patient.    Past Medical History:  Diagnosis Date  . Arthritis   . Colon cancer ([place]) 09/08/09   Colonoscopy by Dr Collene Mares; Surgery by Dr Donne Hazel  . Diabetes mellitus   . Family history of breast cancer    aunt and cousin on father's side  . Family history of colon cancer    grandmother - father's side  . Family history of prostate cancer    uncle - (father's brother)  . Fatigue   . Headache(784.0)   . Hemorrhoid   . Hyperlipidemia   . Hypertension   . Night sweats   . Thyroid disease   . Wears glasses     Patient Active Problem List   Diagnosis Date Noted  . Colon cancer ([place]) 05/01/2012  . 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 RUQ pain 05/01/2012    Past Surgical History:  Procedure Laterality Date  . ABDOMINAL HYSTERECTOMY    . APPENDECTOMY    . CYST REMOVED     BACK OF HEAD  . RIGHT COLECTOMY  09-09-09   FOR COLON CANCER  . THYROID SURGERY  09/02/10   Clarify with patient; BENIGN PER PT  . TUBAL LIGATION      OB History    No data available       Home Medications    Prior to Admission medications   Medication Sig Start Date End Date Taking? Authorizing Provider  amLODipine (NORVASC) 10 MG tablet Take 10 mg by mouth daily.    Yes Historical Provider, MD  DIOVAN HCT 320-25 MG per tablet Take 1 tablet by mouth Daily. 04/20/11  Yes Historical Provider, MD  insulin detemir (LEVEMIR) 100 UNIT/ML injection Inject 35 Units into the skin at bedtime.   Yes Historical Provider, MD  insulin lispro (HUMALOG) 100 UNIT/ML injection Inject 15 Units into the skin 3 (three) times daily before meals.   Yes Historical Provider, MD  levothyroxine (SYNTHROID, LEVOTHROID) 137 MCG tablet Take 137 mcg by mouth daily before breakfast.   Yes Historical Provider, MD  metoprolol (TOPROL-XL) 50 MG 24 hr tablet Take 1 tablet by mouth Daily. 04/20/11  Yes Historical Provider, MD  diphenhydrAMINE (BENADRYL) 25 mg capsule Take 1 capsule (25 mg total) by mouth every 6 (six) hours as needed. 06/14/15   Gloriann Loan, PA-C  hydrocortisone cream 1 % Apply to affected area 2 times daily 06/14/15   Gloriann Loan, PA-C  levothyroxine (SYNTHROID) 125 MCG tablet Take 1 tablet (125 mcg total) by mouth daily. 03/15/11 03/14/12  Stark Klein, MD  metFORMIN (GLUCOPHAGE) 500 MG tablet Take 500 mg by mouth daily.      Historical Provider, MD  nystatin cream (MYCOSTATIN) Apply to affected area 2 times daily 06/14/15   [facility]  Rose, PA-C  Omega-3 Fatty Acids (FISH OIL PO) Take 1,000 mg by mouth 2 (two) times daily. 2 tab 2 x day     Historical Provider, MD  simvastatin (ZOCOR) 10 MG tablet Take 10 mg by mouth at bedtime.    Historical Provider, MD    Family History Family History  Problem Relation Age of Onset  . Diabetes Father   . Heart disease Father   . Hypertension Father   . Kidney disease Father   . Colon cancer Paternal Grandmother 50    Social History Social History  Substance Use Topics  . Smoking status: Former Smoker    Quit date: 04/05/1981  . Smokeless tobacco: Never Used  . Alcohol use No     Allergies   Patient has no known allergies.   Review of Systems Review of Systems  Respiratory: Positive for shortness of breath.   Cardiovascular:  Positive for chest pain.  All other systems reviewed and are negative.    Physical Exam Updated Vital Signs BP (!) 178/105   Pulse 82   Temp 98.1 F (36.7 C) (Oral)   Resp 20   SpO2 100%   Physical Exam  Constitutional: She is oriented to person, place, and time.  Slightly tachypneic   HENT:  Head: Normocephalic.  Eyes: EOM are normal. Pupils are equal, round, and reactive to light.  Neck: Normal range of motion. Neck supple.  Cardiovascular: Regular rhythm and normal heart sounds.   Slightly tachy   Pulmonary/Chest:  Slightly tachypneic, diminished throughout   Abdominal: Soft. Bowel sounds are normal. She exhibits no distension. There is no tenderness. There is no guarding.  Musculoskeletal: Normal range of motion.  Neurological: She is alert and oriented to person, place, and time. She displays normal reflexes. No cranial nerve deficit. Coordination normal.  Skin: Skin is warm.  Psychiatric: She has a normal mood and affect.  Nursing note and vitals reviewed.    ED Treatments / Results  Labs (all labs ordered are listed, but only abnormal results are displayed) Labs Reviewed  BASIC METABOLIC PANEL - Abnormal; Notable for the following:       Result Value   Chloride 98 (*)    Glucose, Bld 380 (*)    All other components within normal limits  CBC - Abnormal; Notable for the following:    WBC 10.6 (*)    RBC 5.40 (*)    All other components within normal limits  URINALYSIS, ROUTINE W REFLEX MICROSCOPIC (NOT AT [facility]) - Abnormal; Notable for the following:    Specific Gravity, Urine 1.031 (*)    Glucose, UA >1000 (*)    Ketones, ur 15 (*)    All other components within normal limits  URINE MICROSCOPIC-ADD ON - Abnormal; Notable for the following:    Squamous Epithelial / LPF 0-5 (*)    Bacteria, UA RARE (*)    All other components within normal limits  I-STAT CG4 LACTIC ACID, ED - Abnormal; Notable for the following:    Lactic Acid, Venous 3.52 (*)    All other  components within normal limits  I-STAT CG4 LACTIC ACID, ED - Abnormal; Notable for the following:    Lactic Acid, Venous 3.14 (*)    All other components within normal limits  URINE CULTURE  HEPATIC FUNCTION PANEL  I-STAT TROPOININ, ED  I-STAT CG4 LACTIC ACID, ED  I-STAT CG4 LACTIC ACID, ED  Randolm Idol, ED    EKG  EKG Interpretation  Date/Time:  Saturday May 29 2016 17:30:05 EST Ventricular Rate:  100 PR Interval:  134 QRS Duration: 92 QT Interval:  374 QTC Calculation: 482 R Axis:   -40 Text Interpretation:  Normal sinus rhythm Left axis deviation Left ventricular hypertrophy with repolarization abnormality Prolonged QT Abnormal ECG No significant change since last tracing Confirmed by Kenzel Ruesch  MD, Mckenleigh Tarlton ([id number]) on 05/29/2016 6:10:00 PM       Radiology Dg Chest 2 View  Addendum Date: 05/29/2016   ADDENDUM REPORT: 05/29/2016 18:24 ADDENDUM: The impression should state "Mild increase in interstitial prominence which may reflect bronchitic change." Electronically Signed   By: Ashley Royalty M.D.   On: 05/29/2016 18:24   Result Date: 05/29/2016 CLINICAL DATA:  Chest pain and dyspnea beneath right breast. Pain into the right shoulder and down right arm for 2 weeks. Cough x2 days. EXAM: CHEST  2 VIEW COMPARISON:  09/05/2009 FINDINGS: The heart size and mediastinal contours are within normal limits. Slight increase in interstitial prominence bilaterally which may reflect bronchitic change. No pneumothorax, effusion or pneumonic consolidation. The visualized skeletal structures are unremarkable. IMPRESSION: No active cardiopulmonary disease. Electronically Signed: By: Ashley Royalty M.D. On: 05/29/2016 18:20   Ct Angio Chest Pe W And/or Wo Contrast  Result Date: 05/29/2016 CLINICAL DATA:  Colon cancer and shortness of breath. EXAM: CT ANGIOGRAPHY CHEST WITH CONTRAST TECHNIQUE: Multidetector CT imaging of the chest was performed using the standard protocol during bolus administration  of intravenous contrast. Multiplanar CT image reconstructions and MIPs were obtained to evaluate the vascular anatomy. CONTRAST:  52 cc Isovue 370 COMPARISON:  Standard CT chest 08/25/2009. FINDINGS: Cardiovascular: Heart size is borderline to mildly increased. No pericardial effusion. No thoracic aortic aneurysm. No filling defects in the opacified pulmonary arteries to suggest the presence of an acute pulmonary embolus. Mediastinum/Nodes: No mediastinal lymphadenopathy. Stable appearance scattered small lymph nodes in the prevascular space. There is no hilar lymphadenopathy. The esophagus has normal imaging features. Interval thyroidectomy. Lungs/Pleura: No pulmonary nodule or mass. No focal airspace consolidation. No pulmonary edema or pleural effusion. Upper Abdomen: Unremarkable. Musculoskeletal: Bone windows reveal no worrisome lytic or sclerotic osseous lesions. Review of the MIP images confirms the above findings. IMPRESSION: 1. No CT evidence for acute pulmonary embolus. 2. No findings to explain the patient's history of shortness of breath. Electronically Signed   By: Misty Stanley M.D.   On: 05/29/2016 21:13    Procedures Procedures (including critical care time)  Medications Ordered in ED Medications  doxycycline (VIBRAMYCIN) 100 mg in dextrose 5 % 250 mL IVPB (100 mg Intravenous New Bag/Given 05/29/16 2212)  morphine 4 MG/ML injection 4 mg (not administered)  sodium chloride 0.9 % bolus 1,000 mL (0 mLs Intravenous Stopped 05/29/16 1946)  ipratropium-albuterol (DUONEB) 0.5-2.5 (3) MG/3ML nebulizer solution 3 mL (3 mLs Nebulization Given 05/29/16 1844)  amLODipine (NORVASC) tablet 10 mg (10 mg Oral Given 05/29/16 1959)  iopamidol (ISOVUE-370) 76 % injection (100 mLs  Contrast Given 05/29/16 2039)  sodium chloride 0.9 % bolus 1,000 mL (0 mLs Intravenous Stopped 05/29/16 2248)  ketorolac (TORADOL) 30 MG/ML injection 30 mg (30 mg Intravenous Given 05/29/16 2212)  cyclobenzaprine (FLEXERIL)  tablet 5 mg (5 mg Oral Given 05/29/16 2212)     Initial Impression / Assessment and Plan / ED Course  I have reviewed the triage vital signs and the nursing notes.  Pertinent labs & imaging results that were available during my care of the patient were reviewed by me and considered in my medical decision making (see chart  for details).  Clinical Course     ANJELAH ANDRING is a 55 y.o. female here with SOB, cough. Consider bronchitis vs pneumonia vs PE. Will get labs, CXR. Will likely need CT angio if CXR clear.   10:50 PM Initial lactate done in triage was 3.5. WBC 11. After 2 L NS bolus, lactate nl now. CXR possible bronchitis. CT angio showed no obvious PE or infiltrate. UA unremarkable. Delta trop neg. Has mild L thoracic back pain now and mild L parathoracic tenderness. No weakness or incontinence and I doubt epidural abscess. I suspect early pneumonia vs bronchitis causing symptoms. Afebrile, not hypotensive. Well appearing. Given doxycyline. Will dc home with doxycyline, pain meds.   Final Clinical Impressions(s) / ED Diagnoses   Final diagnoses:  None    New Prescriptions New Prescriptions   No medications on file     Drenda Freeze, MD 05/29/16 2254

## 2016-05-29 NOTE — ED Notes (Signed)
Patient transported to CT 

## 2016-05-29 NOTE — ED Triage Notes (Signed)
Pt presents with c/o cp. The pain began about 2 weeks ago and has been increasingly worse since onset. Today if feels like something heavy is pressing on her chest. She reports lightheadedness, diaphoresis, sob, productive cough. She denies nausea. She tried drinking coca cola and baking soda water at home with no relief.

## 2016-05-29 NOTE — Discharge Instructions (Signed)
Take motrin for pain.   Take doxycyline twice daily for a week.   Take flexeril for muscle spasms.   Take vicodin for severe pain. Do NOT drive with it.   See your doctor.   Return to ER if you have worse shortness of breath, chest pain, cough, fever, worse back pain, weakness.

## 2016-05-31 LAB — URINE CULTURE

## 2016-08-28 ENCOUNTER — Emergency Department (HOSPITAL_COMMUNITY): Payer: Self-pay

## 2016-08-28 ENCOUNTER — Emergency Department (HOSPITAL_COMMUNITY)
Admission: EM | Admit: 2016-08-28 | Discharge: 2016-08-28 | Disposition: A | Discharge status: Home / Self Care | Payer: Self-pay | Attending: Emergency Medicine | Admitting: Emergency Medicine

## 2016-08-28 ENCOUNTER — Encounter (HOSPITAL_COMMUNITY): Payer: Self-pay | Admitting: Emergency Medicine

## 2016-08-28 DIAGNOSIS — H4902 Third [oculomotor] nerve palsy, left eye: Secondary | ICD-10-CM

## 2016-08-28 DIAGNOSIS — E039 Hypothyroidism, unspecified: Secondary | ICD-10-CM | POA: Insufficient documentation

## 2016-08-28 DIAGNOSIS — Z85038 Personal history of other malignant neoplasm of large intestine: Secondary | ICD-10-CM | POA: Insufficient documentation

## 2016-08-28 DIAGNOSIS — Z79899 Other long term (current) drug therapy: Secondary | ICD-10-CM | POA: Insufficient documentation

## 2016-08-28 DIAGNOSIS — Z794 Long term (current) use of insulin: Secondary | ICD-10-CM | POA: Insufficient documentation

## 2016-08-28 DIAGNOSIS — E119 Type 2 diabetes mellitus without complications: Secondary | ICD-10-CM | POA: Insufficient documentation

## 2016-08-28 DIAGNOSIS — G528 Disorders of other specified cranial nerves: Secondary | ICD-10-CM | POA: Insufficient documentation

## 2016-08-28 DIAGNOSIS — I10 Essential (primary) hypertension: Secondary | ICD-10-CM | POA: Insufficient documentation

## 2016-08-28 DIAGNOSIS — Z87891 Personal history of nicotine dependence: Secondary | ICD-10-CM | POA: Insufficient documentation

## 2016-08-28 LAB — COMPREHENSIVE METABOLIC PANEL
ALT: 16 U/L (ref 14–54)
AST: 21 U/L (ref 15–41)
Albumin: 3.7 g/dL (ref 3.5–5.0)
Alkaline Phosphatase: 82 U/L (ref 38–126)
Anion gap: 14 (ref 5–15)
BUN: 16 mg/dL (ref 6–20)
CO2: 27 mmol/L (ref 22–32)
Calcium: 9.3 mg/dL (ref 8.9–10.3)
Chloride: 93 mmol/L — ABNORMAL LOW (ref 101–111)
Creatinine, Ser: 0.9 mg/dL (ref 0.44–1.00)
GFR calc Af Amer: >60 mL/min (ref >60)
GFR calc non Af Amer: >60 mL/min (ref >60)
Glucose, Bld: 396 mg/dL — ABNORMAL HIGH (ref 65–99)
Potassium: 3.2 mmol/L — ABNORMAL LOW (ref 3.5–5.1)
Sodium: 134 mmol/L — ABNORMAL LOW (ref 135–145)
Total Bilirubin: 0.6 mg/dL (ref 0.3–1.2)
Total Protein: 7.1 g/dL (ref 6.5–8.1)

## 2016-08-28 LAB — CBC
HCT: 40.7 % (ref 36.0–46.0)
Hemoglobin: 13.5 g/dL (ref 12.0–15.0)
MCH: 26.9 pg (ref 26.0–34.0)
MCHC: 33.2 g/dL (ref 30.0–36.0)
MCV: 81.1 fL (ref 78.0–100.0)
Platelets: 225 10*3/uL (ref 150–400)
RBC: 5.02 MIL/uL (ref 3.87–5.11)
RDW: 13.4 % (ref 11.5–15.5)
WBC: 10.5 10*3/uL (ref 4.0–10.5)

## 2016-08-28 LAB — CBG MONITORING, ED: GLUCOSE-CAPILLARY: 402 mg/dL — AB (ref 65–99)

## 2016-08-28 MED ORDER — TETRACAINE HCL 0.5 % OP SOLN
1.0000 [drp] | Freq: Once | OPHTHALMIC | Status: AC
  Administered 2016-08-28: 1 [drp] via OPHTHALMIC
  Filled 2016-08-28: qty 2

## 2016-08-28 MED ORDER — INSULIN ASPART 100 UNIT/ML ~~LOC~~ SOLN
5.0000 [IU] | Freq: Once | SUBCUTANEOUS | Status: AC
  Administered 2016-08-28: 5 [IU] via INTRAVENOUS
  Filled 2016-08-28: qty 1

## 2016-08-28 MED ORDER — IOPAMIDOL (ISOVUE-370) INJECTION 76%
INTRAVENOUS | Status: AC
  Administered 2016-08-28: 50 mL
  Filled 2016-08-28: qty 50

## 2016-08-28 NOTE — ED Provider Notes (Signed)
[facility] DEPT Provider Note   CSN: [id number] Arrival date & time: 08/28/16  1230     History   Chief Complaint Chief Complaint  Patient presents with  . Blurred Vision    HPI Brenda [name] is a 56 y.o. female.  HPI Pt states "Im having problems with my vision blurring and seeing double in my left eye, and I cant focus for the last couple of days.".   Patient is a diabetic but denies any significant underlying eye disease except astigmatism for which she wears glasses. However, patient states that when the glasses does not help at all.  She states sx have been ongoing for several days. Has a minor headache but not significant. No significant neck stiffness. No weakness or numbness.Speech. No history of strokes.  Patient is a diabetic but has not been anything since this morning and has not taken her second insulin dose this afternoon.   Denies any trauma. Does have a remote history of colon cancer for which she is not on therapy. She denies any pain in her eye.   She has no family history of aneurysms. She has not had a fever or any infectious symptoms otherwise. Denies any trauma.  Sx are severe. She is unable to drive due to visual changes.   Past Medical History:  Diagnosis Date  . Arthritis   . Colon cancer ([place]) 09/08/09   Colonoscopy by Dr Collene Mares; Surgery by Dr Donne Hazel  . Diabetes mellitus   . Family history of breast cancer    aunt and cousin on father's side  . Family history of colon cancer    grandmother - father's side  . Family history of prostate cancer    uncle - (father's brother)  . Fatigue   . Headache(784.0)   . Hemorrhoid   . Hyperlipidemia   . Hypertension   . Night sweats   . Thyroid disease   . Wears glasses     Patient Active Problem List   Diagnosis Date Noted  . Colon cancer ([place]) 05/01/2012  . 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 RUQ pain 05/01/2012     Past Surgical History:  Procedure Laterality Date  . ABDOMINAL HYSTERECTOMY    . APPENDECTOMY    . CYST REMOVED     BACK OF HEAD  . RIGHT COLECTOMY  09-09-09   FOR COLON CANCER  . THYROID SURGERY  09/02/10   Clarify with patient; BENIGN PER PT  . TUBAL LIGATION      OB History    No data available       Home Medications    Prior to Admission medications   Medication Sig Start Date End Date Taking? Authorizing Provider  amLODipine (NORVASC) 10 MG tablet Take 10 mg by mouth daily.    Historical Provider, MD  cyclobenzaprine (FLEXERIL) 5 MG tablet Take 1 tablet (5 mg total) by mouth 2 (two) times daily as needed for muscle spasms. 05/29/16   Drenda Freeze, MD  DIOVAN HCT 320-25 MG per tablet Take 1 tablet by mouth Daily. 04/20/11   Historical Provider, MD  diphenhydrAMINE (BENADRYL) 25 mg capsule Take 1 capsule (25 mg total) by mouth every 6 (six) hours as needed. 06/14/15   Gloriann Loan, PA-C  doxycycline (VIBRAMYCIN) 100 MG capsule Take 1 capsule (100 mg total) by mouth 2 (two) times daily. One po bid x 7 days 05/29/16   Drenda Freeze, MD  HYDROcodone-acetaminophen (NORCO/VICODIN) [phone]  MG tablet Take 1 tablet by mouth every 6 (six) hours as needed. 05/29/16   Drenda Freeze, MD  hydrocortisone cream 1 % Apply to affected area 2 times daily 06/14/15   Gloriann Loan, PA-C  insulin detemir (LEVEMIR) 100 UNIT/ML injection Inject 35 Units into the skin at bedtime.    Historical Provider, MD  insulin lispro (HUMALOG) 100 UNIT/ML injection Inject 15 Units into the skin 3 (three) times daily before meals.    Historical Provider, MD  levothyroxine (SYNTHROID) 125 MCG tablet Take 1 tablet (125 mcg total) by mouth daily. 03/15/11 03/14/12  Stark Klein, MD  levothyroxine (SYNTHROID, LEVOTHROID) 137 MCG tablet Take 137 mcg by mouth daily before breakfast.    Historical Provider, MD  metFORMIN (GLUCOPHAGE) 500 MG tablet Take 500 mg by mouth daily.      Historical Provider, MD  metoprolol  (TOPROL-XL) 50 MG 24 hr tablet Take 1 tablet by mouth Daily. 04/20/11   Historical Provider, MD  nystatin cream (MYCOSTATIN) Apply to affected area 2 times daily 06/14/15   Gloriann Loan, PA-C  Omega-3 Fatty Acids (FISH OIL PO) Take 1,000 mg by mouth 2 (two) times daily. 2 tab 2 x day     Historical Provider, MD  simvastatin (ZOCOR) 10 MG tablet Take 10 mg by mouth at bedtime.    Historical Provider, MD    Family History Family History  Problem Relation Age of Onset  . Diabetes Father   . Heart disease Father   . Hypertension Father   . Kidney disease Father   . Colon cancer Paternal Grandmother 62    Social History Social History  Substance Use Topics  . Smoking status: Former Smoker    Quit date: 04/05/1981  . Smokeless tobacco: Never Used  . Alcohol use No     Allergies   Patient has no known allergies.   Review of Systems Review of Systems  Constitutional: Negative for fever.  Eyes: Positive for visual disturbance. Negative for photophobia and pain.  All other systems reviewed and are negative.    Physical Exam Updated Vital Signs BP 143/90   Pulse 100   Temp 98.4 F (36.9 C) (Oral)   Resp 16   SpO2 100%   Physical Exam  Constitutional: She appears well-developed and well-nourished. No distress.  HENT:  Head: Normocephalic and atraumatic.  Eyes: Conjunctivae are normal.  Neck: Neck supple.  Cardiovascular: Normal rate and regular rhythm.   No murmur heard. Pulmonary/Chest: Effort normal and breath sounds normal. No respiratory distress.  Abdominal: Soft. There is no tenderness.  Musculoskeletal: She exhibits no edema.  Neurological: She is alert. She has normal strength and normal reflexes. She displays no atrophy and no tremor. A cranial nerve deficit (only CN3) is present. She exhibits normal muscle tone. She displays a negative Romberg sign. She displays no seizure activity. Coordination and gait normal.  Normal gait wo ataxia, hemiplegia  Skin: Skin is  warm and dry.  Psychiatric: She has a normal mood and affect.  Nursing note and vitals reviewed.  Left eye does not have normal lateral gaze  No pain with eye movement No ptosis PERRLA, 22mm bilaterally 20/50 bilaterally wo glassess (pt does wear normally)  ED Treatments / Results  Labs (all labs ordered are listed, but only abnormal results are displayed) Labs Reviewed  COMPREHENSIVE METABOLIC PANEL - Abnormal; Notable for the following:       Result Value   Sodium 134 (*)    Potassium 3.2 (*)  Chloride 93 (*)    Glucose, Bld 396 (*)    All other components within normal limits  CBG MONITORING, ED - Abnormal; Notable for the following:    Glucose-Capillary 402 (*)    All other components within normal limits  CBC    EKG  EKG Interpretation None       Radiology Ct Angio Head W Or Wo Contrast  Result Date: 08/28/2016 CLINICAL DATA:  Isolated left cranial nerve 3 palsy. Patient has history of thyroid disease, diabetes and hypertension. EXAM: CT ANGIOGRAPHY HEAD TECHNIQUE: Multidetector CT imaging of the head was performed using the standard protocol during bolus administration of intravenous contrast. Multiplanar CT image reconstructions and MIPs were obtained to evaluate the vascular anatomy. CONTRAST:  50 cc Isovue 370 COMPARISON:  CT 09/23/2005 FINDINGS: CT HEAD Brain: The brain does not show generalized atrophy. There is no evidence of acute focal infarction, mass lesion, hemorrhage, hydrocephalus or extra-axial collection. There are chronic appearing small vessel ischemic changes of the cerebral hemispheric white matter. Vascular: No vascular finding on the noncontrast portion. Skull: Normal Sinuses: Clear Orbits: Normal CTA HEAD Anterior circulation: Both internal carotid arteries are widely patent through the skullbase and siphon regions. The anterior and middle cerebral vessels are normal without proximal stenosis, aneurysm or vascular malformation. Cavernous sinus regions  appear normal. Posterior circulation: Both vertebral arteries are patent with the left being dominant. Posterior circulation branch vessels appear patent and normal. Basilar artery patent without stenosis. Posterior circulation branch vessels patent and normal. Venous sinuses: Patent and normal Anatomic variants: None significant Delayed phase: No abnormal enhancement IMPRESSION: No vascular cause of left third nerve palsy identified. No aneurysm. No evidence of cavernous sinus lesion. No visible orbital pathology. Electronically Signed   By: Nelson Chimes M.D.   On: 08/28/2016 16:48    Procedures Procedures (including critical care time)  Medications Ordered in ED Medications  tetracaine (PONTOCAINE) 0.5 % ophthalmic solution 1 drop (1 drop Left Eye Given 08/28/16 1504)  insulin aspart (novoLOG) injection 5 Units (5 Units Intravenous Given 08/28/16 1525)  iopamidol (ISOVUE-370) 76 % injection (50 mLs  Contrast Given 08/28/16 1607)     Initial Impression / Assessment and Plan / ED Course  I have reviewed the triage vital signs and the nursing notes.  Pertinent labs & imaging results that were available during my care of the patient were reviewed by me and considered in my medical decision making (see chart for details).   CT head CT angiogram without aneurysm, thrombus, stroke. There is isolated cranial nerve third palsy without evidence of infection, meningitis, trauma. Patient is very hyperglycemic. Small dose of insulin given here but patient is about take lunch and so will be taking her insulin. I had an extensive discussion with patient about the importance of improvement in her insulin regimen. I recommended at least a 30 pound weight loss that she can work with her primary care provider. She also has not been checking her sugars and will purchase a glucometer and right and diary. Surgical precautions. Patient discharged in good condition. No further questions. Eye patch provided.  Final  Clinical Impressions(s) / ED Diagnoses   Final diagnoses:  Weakness of left third cranial nerve    New Prescriptions New Prescriptions   No medications on file     Karma Greaser, MD 08/28/16 1729    Quintella Reichert, MD 09/01/16 0700

## 2016-08-28 NOTE — Discharge Instructions (Signed)
My recommendation would be that you go on an intense weight loss diet and diabetic sugar control. You likely need to lose at least 30 lbs. Discuss with your primary care doctor.

## 2016-08-28 NOTE — ED Notes (Signed)
Tono pens given to EDP

## 2016-08-28 NOTE — ED Triage Notes (Signed)
Pt states "Im having problems with my vision blurring and seeing double in my left eye, and I cant focus for the last couple of days.".

## 2016-08-28 NOTE — ED Notes (Signed)
Pt returns from ct scan. 

## 2016-08-28 NOTE — ED Notes (Signed)
Patient at CT

## 2017-06-13 ENCOUNTER — Emergency Department (HOSPITAL_COMMUNITY)
Admission: EM | Admit: 2017-06-13 | Discharge: 2017-06-13 | Disposition: A | Discharge status: Home / Self Care | Payer: Self-pay | Attending: Emergency Medicine | Admitting: Emergency Medicine

## 2017-06-13 ENCOUNTER — Emergency Department (HOSPITAL_BASED_OUTPATIENT_CLINIC_OR_DEPARTMENT_OTHER)
Admit: 2017-06-13 | Discharge: 2017-06-13 | Disposition: A | Discharge status: Home / Self Care | Payer: Self-pay | Attending: Emergency Medicine | Admitting: Emergency Medicine

## 2017-06-13 ENCOUNTER — Encounter (HOSPITAL_COMMUNITY): Payer: Self-pay

## 2017-06-13 DIAGNOSIS — E039 Hypothyroidism, unspecified: Secondary | ICD-10-CM | POA: Insufficient documentation

## 2017-06-13 DIAGNOSIS — M79605 Pain in left leg: Secondary | ICD-10-CM | POA: Insufficient documentation

## 2017-06-13 DIAGNOSIS — E119 Type 2 diabetes mellitus without complications: Secondary | ICD-10-CM | POA: Insufficient documentation

## 2017-06-13 DIAGNOSIS — C189 Malignant neoplasm of colon, unspecified: Secondary | ICD-10-CM | POA: Insufficient documentation

## 2017-06-13 DIAGNOSIS — M79609 Pain in unspecified limb: Secondary | ICD-10-CM

## 2017-06-13 DIAGNOSIS — I10 Essential (primary) hypertension: Secondary | ICD-10-CM | POA: Insufficient documentation

## 2017-06-13 DIAGNOSIS — Z87891 Personal history of nicotine dependence: Secondary | ICD-10-CM | POA: Insufficient documentation

## 2017-06-13 DIAGNOSIS — Z79899 Other long term (current) drug therapy: Secondary | ICD-10-CM | POA: Insufficient documentation

## 2017-06-13 DIAGNOSIS — Z794 Long term (current) use of insulin: Secondary | ICD-10-CM | POA: Insufficient documentation

## 2017-06-13 LAB — CBC WITH DIFFERENTIAL/PLATELET
BASOS ABS: 0 10*3/uL (ref 0.0–0.1)
Basophils Relative: 0 %
EOS PCT: 1 %
Eosinophils Absolute: 0.1 10*3/uL (ref 0.0–0.7)
HEMATOCRIT: 40.6 % (ref 36.0–46.0)
Hemoglobin: 13.4 g/dL (ref 12.0–15.0)
LYMPHS ABS: 3.7 10*3/uL (ref 0.7–4.0)
LYMPHS PCT: 35 %
MCH: 27.3 pg (ref 26.0–34.0)
MCHC: 33 g/dL (ref 30.0–36.0)
MCV: 82.9 fL (ref 78.0–100.0)
Monocytes Absolute: 0.7 10*3/uL (ref 0.1–1.0)
Monocytes Relative: 6 %
NEUTROS ABS: 6.1 10*3/uL (ref 1.7–7.7)
Neutrophils Relative %: 58 %
Platelets: 230 10*3/uL (ref 150–400)
RBC: 4.9 MIL/uL (ref 3.87–5.11)
RDW: 14.6 % (ref 11.5–15.5)
WBC: 10.6 10*3/uL — AB (ref 4.0–10.5)

## 2017-06-13 LAB — BASIC METABOLIC PANEL
Anion gap: 11 (ref 5–15)
BUN: 20 mg/dL (ref 6–20)
CHLORIDE: 101 mmol/L (ref 101–111)
CO2: 24 mmol/L (ref 22–32)
Calcium: 9.2 mg/dL (ref 8.9–10.3)
Creatinine, Ser: 0.78 mg/dL (ref 0.44–1.00)
GFR calc Af Amer: >60 mL/min (ref >60)
GLUCOSE: 180 mg/dL — AB (ref 65–99)
POTASSIUM: 3.8 mmol/L (ref 3.5–5.1)
Sodium: 136 mmol/L (ref 135–145)

## 2017-06-13 LAB — CK: Total CK: 68 U/L (ref 38–234)

## 2017-06-13 MED ORDER — GABAPENTIN 100 MG PO CAPS: 100.0000 mg | ORAL_CAPSULE | Freq: Three times a day (TID) | ORAL | 0 refills | Status: DC

## 2017-06-13 NOTE — Progress Notes (Signed)
LLE venous duplex prelim: negative for DVT. Shalawn Wynder Eunice, RDMS, RVT  

## 2017-06-13 NOTE — ED Provider Notes (Signed)
[facility] EMERGENCY DEPARTMENT Provider Note   CSN: [id number] Arrival date & time: 06/13/17  1257     History   Chief Complaint Chief Complaint  Patient presents with  . Leg Pain    HPI Brenda [name] is a 56 y.o. female.  HPI Patient presents with gradual onset left lower leg pain for the past week.  She describes the pain as squeezing like.  Denies any new weakness or numbness.  Denies any recent falls.  She has no chest pain or shortness of breath.  She does report low back pain that spreads bilaterally.  No urinary symptoms including dysuria or incontinence. Past Medical History:  Diagnosis Date  . Arthritis   . Colon cancer ([place]) 09/08/09   Colonoscopy by Dr Collene Mares; Surgery by Dr Donne Hazel  . Diabetes mellitus   . Family history of breast cancer    aunt and cousin on father's side  . Family history of colon cancer    grandmother - father's side  . Family history of prostate cancer    uncle - (father's brother)  . Fatigue   . Headache(784.0)   . Hemorrhoid   . Hyperlipidemia   . Hypertension   . Night sweats   . Thyroid disease   . Wears glasses     Patient Active Problem List   Diagnosis Date Noted  . Colon cancer ([place]) 05/01/2012  . 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 RUQ pain 05/01/2012    Past Surgical History:  Procedure Laterality Date  . ABDOMINAL HYSTERECTOMY    . APPENDECTOMY    . CYST REMOVED     BACK OF HEAD  . RIGHT COLECTOMY  09-09-09   FOR COLON CANCER  . THYROID SURGERY  09/02/10   Clarify with patient; BENIGN PER PT  . TUBAL LIGATION      OB History    No data available       Home Medications    Prior to Admission medications   Medication Sig Start Date End Date Taking? Authorizing Provider  acetaminophen (TYLENOL) 500 MG tablet Take 1,000 mg by mouth every 6 (six) hours as needed for mild pain or headache.   Yes [provider]  amLODipine (NORVASC) 10 MG tablet Take 10 mg by mouth daily.   Yes [provider]  CINNAMON PO Take 1,000 tablets by mouth daily.   Yes [provider]  insulin detemir (LEVEMIR) 100 UNIT/ML injection Inject 35 Units into the skin at bedtime.   Yes [provider]  insulin lispro (HUMALOG) 100 UNIT/ML injection Inject 15 Units into the skin 3 (three) times daily before meals.   Yes [provider]  levothyroxine (SYNTHROID, LEVOTHROID) 137 MCG tablet Take 137 mcg by mouth daily before breakfast.   Yes [provider]  metoprolol (TOPROL-XL) 50 MG 24 hr tablet Take 1 tablet by mouth Daily. 04/20/11  Yes [provider]  TURMERIC PO Take 1 tablet by mouth daily.   Yes [provider]  cyclobenzaprine (FLEXERIL) 5 MG tablet Take 1 tablet (5 mg total) by mouth 2 (two) times daily as needed for muscle spasms. Patient not taking: Reported on 06/13/2017 05/29/16   Drenda Freeze, MD  diphenhydrAMINE (BENADRYL) 25 mg capsule Take 1 capsule (25 mg total) by mouth every 6 (six) hours as needed. Patient not taking: Reported on 06/13/2017 06/14/15   Gloriann Loan, PA-C  doxycycline (VIBRAMYCIN) 100 MG capsule Take  1 capsule (100 mg total) by mouth 2 (two) times daily. One po bid x 7 days Patient not taking: Reported on 06/13/2017 05/29/16   Drenda Freeze, MD  gabapentin (NEURONTIN) 100 MG capsule Take 1 capsule (100 mg total) by mouth 3 (three) times daily. 06/13/17   Julianne Rice, MD  HYDROcodone-acetaminophen (NORCO/VICODIN) 5-325 MG tablet Take 1 tablet by mouth every 6 (six) hours as needed. Patient not taking: Reported on 06/13/2017 05/29/16   Drenda Freeze, MD  hydrocortisone cream 1 % Apply to affected area 2 times daily Patient not taking: Reported on 06/13/2017 06/14/15   Gloriann Loan, PA-C  levothyroxine (SYNTHROID) 125 MCG tablet Take 1 tablet (125 mcg total) by mouth daily. 03/15/11 03/14/12  Stark Klein, MD    nystatin cream (MYCOSTATIN) Apply to affected area 2 times daily Patient not taking: Reported on 06/13/2017 06/14/15   Gloriann Loan, PA-C    Family History Family History  Problem Relation Age of Onset  . Diabetes Father   . Heart disease Father   . Hypertension Father   . Kidney disease Father   . Colon cancer Paternal Grandmother 45    Social History Social History   Tobacco Use  . Smoking status: Former Smoker    Last attempt to quit: 04/05/1981    Years since quitting: 36.2  . Smokeless tobacco: Never Used  Substance Use Topics  . Alcohol use: No  . Drug use: No     Allergies   Patient has no known allergies.   Review of Systems Review of Systems  Constitutional: Positive for fatigue. Negative for chills and fever.  Respiratory: Negative for cough and shortness of breath.   Cardiovascular: Negative for chest pain and palpitations.  Gastrointestinal: Negative for abdominal pain, constipation, diarrhea, nausea and vomiting.  Genitourinary: Negative for difficulty urinating and dysuria.  Musculoskeletal: Positive for arthralgias, back pain and myalgias. Negative for gait problem, joint swelling, neck pain and neck stiffness.  Skin: Negative for rash and wound.  Neurological: Negative for dizziness, weakness, light-headedness, numbness and headaches.  All other systems reviewed and are negative.    Physical Exam Updated Vital Signs BP (!) 166/91   Pulse 73   Temp 99.2 F (37.3 C) (Oral)   Resp 18   SpO2 99%   Physical Exam  Constitutional: She is oriented to person, place, and time. She appears well-developed and well-nourished. No distress.  HENT:  Head: Normocephalic and atraumatic.  Mouth/Throat: Oropharynx is clear and moist. No oropharyngeal exudate.  Eyes: EOM are normal. Pupils are equal, round, and reactive to light.  Neck: Normal range of motion. Neck supple.  Cardiovascular: Normal rate and regular rhythm.  Pulmonary/Chest: Effort normal and  breath sounds normal. No stridor. No respiratory distress. She has no wheezes. She has no rales. She exhibits no tenderness.  Abdominal: Soft. Bowel sounds are normal. There is no tenderness. There is no rebound and no guarding.  Musculoskeletal: Normal range of motion. She exhibits tenderness. She exhibits no edema.  Patient with mild tenderness over the medial surface of the left mid tibia.  No definite calf asymmetry or tenderness.  She appears to have a lipoma over the left lateral malleolus.  Distal pulses are 2+.  No midline thoracic or lumbar tenderness to palpation.  Negative straight leg raise bilaterally.  Neurological: She is alert and oriented to person, place, and time.  5/5 motor in all extremities.  Sensation fully intact.  No saddle anesthesia.  Skin: Skin is warm and dry.  Capillary refill takes less than 2 seconds. No rash noted. No erythema.  Psychiatric: Her behavior is normal.  Nursing note and vitals reviewed.    ED Treatments / Results  Labs (all labs ordered are listed, but only abnormal results are displayed) Labs Reviewed  CBC WITH DIFFERENTIAL/PLATELET - Abnormal; Notable for the following components:      Result Value   WBC 10.6 (*)    All other components within normal limits  BASIC METABOLIC PANEL - Abnormal; Notable for the following components:   Glucose, Bld 180 (*)    All other components within normal limits  CK    EKG  EKG Interpretation None       Radiology No results found.  Procedures Procedures (including critical care time)  Medications Ordered in ED Medications - No data to display   Initial Impression / Assessment and Plan / ED Course  I have reviewed the triage vital signs and the nursing notes.  Pertinent labs & imaging results that were available during my care of the patient were reviewed by me and considered in my medical decision making (see chart for details).     Laboratory workup and ultrasound both negative.   Suspect patient is having peripheral neuropathy likely related to her diabetes.  Will start on gabapentin and have follow-up closely with her primary physician.  Return precautions given.  Final Clinical Impressions(s) / ED Diagnoses   Final diagnoses:  Left leg pain    ED Discharge Orders        Ordered    gabapentin (NEURONTIN) 100 MG capsule  3 times daily     06/13/17 1742       Julianne Rice, MD 06/13/17 1743

## 2017-06-13 NOTE — ED Triage Notes (Signed)
Per Pt, pt is coming from home with complaints of left leg pain that started about a week ago with some swelling to the left ankle. Denies injury and reports the pain is radiating into her lower leg. Denies calf pain. Pulses intact.

## 2017-06-13 NOTE — ED Notes (Signed)
Patient transported to vascular. 

## 2017-08-23 IMAGING — CT CT ANGIO HEAD
1 of 12 series · 3 of 33 positions shown · IV contrast (isovue)
Comparison: CT 09/23/2005

CLINICAL DATA: Isolated left cranial nerve 3 palsy. Patient has
history of thyroid disease, diabetes and hypertension.

EXAM:
CT ANGIOGRAPHY HEAD
TECHNIQUE: Multidetector CT imaging of the head was performed using the
standard protocol during bolus administration of intravenous
contrast. Multiplanar CT image reconstructions and MIPs were
obtained to evaluate the vascular anatomy.
CONTRAST:  50 cc Isovue 370

[Series 12: cta neck axial · axial · 0.39mm/px · z∈[+1369,+1452]mm · 3 of 169 slices shown]
[im 43/169  soft-tissue]
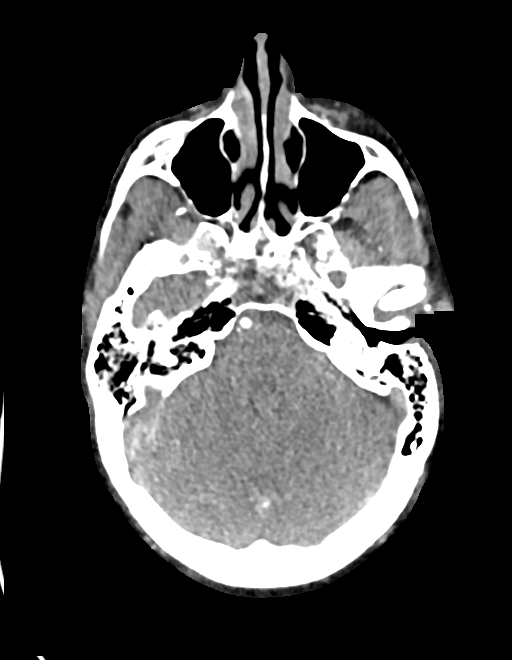
[im 85/169  bone]
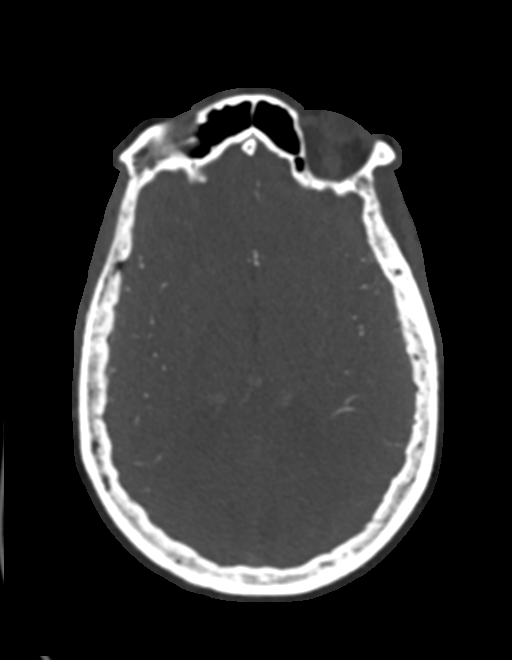
[im 127/169  soft-tissue]
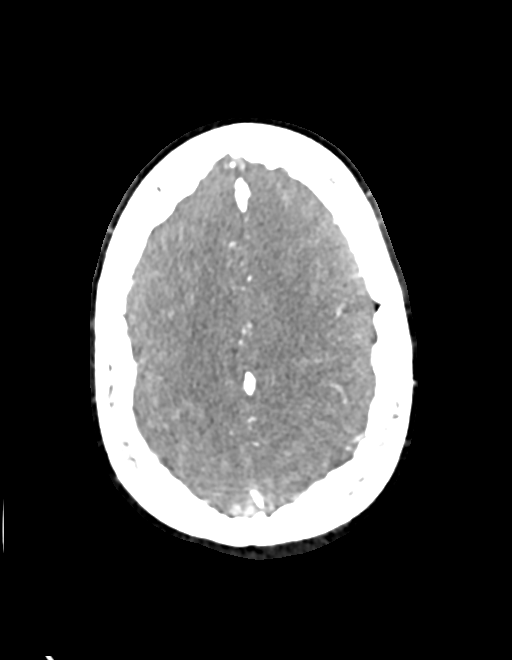

[3 of 33 positions shown; findings below may reference images not displayed]

FINDINGS: CT HEAD

Brain: The brain does not show generalized atrophy. There is no
evidence of acute focal infarction, mass lesion, hemorrhage,
hydrocephalus or extra-axial collection. There are chronic appearing
small vessel ischemic changes of the cerebral hemispheric white
matter.

Vascular: No vascular finding on the noncontrast portion.

Skull: Normal

Sinuses: Clear

Orbits: Normal

CTA HEAD

Anterior circulation: Both internal carotid arteries are widely
patent through the skullbase and siphon regions. The anterior and
middle cerebral vessels are normal without proximal stenosis,
aneurysm or vascular malformation. Cavernous sinus regions appear
normal.

Posterior circulation: Both vertebral arteries are patent with the
left being dominant. Posterior circulation branch vessels appear
patent and normal. Basilar artery patent without stenosis. Posterior
circulation branch vessels patent and normal.

Venous sinuses: Patent and normal

Anatomic variants: None significant

Delayed phase: No abnormal enhancement
IMPRESSION: No vascular cause of left third nerve palsy identified. No aneurysm.

No evidence of cavernous sinus lesion. No visible orbital pathology.

## 2019-06-21 ENCOUNTER — Ambulatory Visit: Payer: Self-pay | Attending: Family Medicine | Admitting: Family Medicine

## 2019-06-21 ENCOUNTER — Other Ambulatory Visit: Payer: Self-pay

## 2019-06-21 ENCOUNTER — Encounter: Payer: Self-pay | Admitting: Family Medicine

## 2019-06-21 DIAGNOSIS — Z20822 Contact with and (suspected) exposure to covid-19: Secondary | ICD-10-CM

## 2019-06-21 DIAGNOSIS — E1169 Type 2 diabetes mellitus with other specified complication: Secondary | ICD-10-CM

## 2019-06-21 DIAGNOSIS — Z85038 Personal history of other malignant neoplasm of large intestine: Secondary | ICD-10-CM

## 2019-06-21 DIAGNOSIS — E039 Hypothyroidism, unspecified: Secondary | ICD-10-CM

## 2019-06-21 DIAGNOSIS — I10 Essential (primary) hypertension: Secondary | ICD-10-CM

## 2019-06-21 DIAGNOSIS — E785 Hyperlipidemia, unspecified: Secondary | ICD-10-CM

## 2019-06-21 LAB — POCT GLYCOSYLATED HEMOGLOBIN (HGB A1C): Hemoglobin A1C: 11.6 % — AB (ref 4.0–5.6)

## 2019-06-21 MED ORDER — METOPROLOL SUCCINATE ER 50 MG PO TB24: 50.0000 mg | ORAL_TABLET | Freq: Every day | ORAL | 4 refills | Status: DC

## 2019-06-21 MED ORDER — LEVOTHYROXINE SODIUM 137 MCG PO TABS: 137.0000 ug | ORAL_TABLET | Freq: Every day | ORAL | 4 refills | Status: DC

## 2019-06-21 MED ORDER — TRUE METRIX BLOOD GLUCOSE TEST VI STRP: ORAL_STRIP | 12 refills | Status: AC

## 2019-06-21 MED ORDER — VALSARTAN-HYDROCHLOROTHIAZIDE 320-25 MG PO TABS: 1.0000 | ORAL_TABLET | Freq: Every day | ORAL | 4 refills | Status: DC

## 2019-06-21 MED ORDER — INSULIN DETEMIR 100 UNIT/ML ~~LOC~~ SOLN: 35.0000 [IU] | Freq: Every day | SUBCUTANEOUS | 4 refills | Status: DC

## 2019-06-21 MED ORDER — AMLODIPINE BESYLATE 10 MG PO TABS: 10.0000 mg | ORAL_TABLET | Freq: Every day | ORAL | 4 refills | Status: DC

## 2019-06-21 MED ORDER — TRUEPLUS LANCETS 28G MISC: 11 refills | Status: AC

## 2019-06-21 MED ORDER — TRUE METRIX METER W/DEVICE KIT: PACK | 0 refills | Status: AC

## 2019-06-21 MED FILL — AMLODIPINE BESYLATE 10 MG T: 10 | 30 days supply | Qty: 30 | Fill #0

## 2019-06-21 MED FILL — VALSARTAN-HCTZ 320-25 MG TA: 320-25 | 30 days supply | Qty: 30 | Fill #0

## 2019-06-21 MED FILL — TRUE METRIX TEST STRIP: 25 days supply | Qty: 100 | Fill #0

## 2019-06-21 MED FILL — TRUEplus LANCETS 28G MISC: 25 days supply | Qty: 100 | Fill #0

## 2019-06-21 MED FILL — !TRUE METRIX BLOOD GLUCOSE: 1 days supply | Qty: 1 | Fill #0

## 2019-06-21 MED FILL — LEVOTHYROXINE 137 MCG TAB: 137 | 30 days supply | Qty: 30 | Fill #0

## 2019-06-21 MED FILL — METOPROLOL SUCCINATE ER 50: 50 | 30 days supply | Qty: 30 | Fill #0

## 2019-06-21 MED FILL — !LEVEMIR 100 UNITS/ML VIAL: 100/ML | 29 days supply | Qty: 10 | Fill #0

## 2019-06-21 NOTE — Progress Notes (Signed)
Virtual Visit via Telephone Note  I connected with Brenda [name] on 06/21/19 at  8:50 AM EST by telephone and verified that I am speaking with the correct person using two identifiers.   I discussed the limitations, risks, security and privacy concerns of performing an evaluation and management service by telephone and the availability of in person appointments. I also discussed with the patient that there may be a patient responsible charge related to this service. The patient expressed understanding and agreed to proceed.  Patient Location: [facility] in Parking lot of [facility] (she was to be seen in person however she had a sore throat on Covid screening therefore visit was switched to telemedicine visit) Provider Location: [facility] office Others participating in call: none   History of Present Illness:        58 year old female new to the practice who has history of hypertension, colon cancer, hypertension, hyperlipidemia and postsurgical hypothyroidism.  She reports that she has been out of medications since about February of this year.  She does have issues with increased thirst and urinary frequency currently.  She denies dysuria or abdominal pain.  She believes her symptoms are related to her diabetes.  She does not currently have any monitoring supplies so she is not sure how high her blood sugars have been.  She has had some occasional headaches, dull and generalized which she believes are likely related to hypertension as she has also been out of blood pressure medication.  She denies any dizziness and has had no episodes of focal numbness or weakness.  She also needs refill of thyroid medication.  She denies any increased muscle or joint pain with prior use of cholesterol medication.          She reports a past medical history significant for colon cancer for which she had right colectomy.  She denies any current abdominal pain, no blood in the stool and no black stools.  She has also previously  had thyroid surgery.  She reports that she was told that she did not have thyroid cancer.  She does have some fatigue as well as weight gain since being out of her thyroid medication.          On additional review of systems, she denies any headaches or dizziness, no chest pain or palpitations, no shortness of breath or cough.  She has had no fever or chills.  No known exposure to anyone with COVID-19.  She does have some postnasal drainage which she believes may be the cause of her sore throat.  She denies any difficulty swallowing.  No nausea/vomiting/diarrhea or constipation.  No burning with urination/dysuria.           Past Medical History:  Diagnosis Date  . Arthritis   . Colon cancer ([place]) 09/08/09   Colonoscopy by Dr Collene Mares; Surgery by Dr Donne Hazel  . Diabetes mellitus   . Family history of breast cancer    aunt and cousin on father's side  . Family history of colon cancer    grandmother - father's side  . Family history of prostate cancer    uncle - (father's brother)  . Fatigue   . Headache(784.0)   . Hemorrhoid   . Hyperlipidemia   . Hypertension   . Night sweats   . Thyroid disease   . Wears glasses     Past Surgical History:  Procedure Laterality Date  . ABDOMINAL HYSTERECTOMY    . APPENDECTOMY    . CYST REMOVED  BACK OF HEAD  . RIGHT COLECTOMY  09-09-09   FOR COLON CANCER  . THYROID SURGERY  09/02/10   Clarify with patient; BENIGN PER PT  . TUBAL LIGATION      Family History  Problem Relation Age of Onset  . Diabetes Father   . Heart disease Father   . Hypertension Father   . Kidney disease Father   . Colon cancer Paternal Grandmother 54    Social History   Tobacco Use  . Smoking status: Former Smoker    Quit date: 04/05/1981    Years since quitting: 38.2  . Smokeless tobacco: Never Used  Substance Use Topics  . Alcohol use: No  . Drug use: No     No Known Allergies     Observations/Objective: No vital signs or physical exam conducted as visit  was done via telephone  Assessment and Plan: 1. Type 2 diabetes mellitus with other specified complication, unspecified whether long term insulin use ([place]) She reports that she has been out of her diabetes medications for several months, possibly as long as since February of this year.  Hemoglobin A1c was done by medical assistant and patient with A1c of 11.6.  She will be restarted on Levemir at 35 units once daily which she states that she took in the past with good control of her blood sugars.  Diabetic testing supplies provided so that patient can restart monitoring of her blood sugars.  She has been asked to call the office if her blood sugars are not staying less than 140 fasting within the next 2 weeks after restarting her medications.  Diabetic diet and exercise as tolerated encouraged to help control blood sugars.  On review of chart, patient was previously prescribed premeal insulin but she reports that she was not compliant with use. - HgB A1c - insulin detemir (LEVEMIR) 100 UNIT/ML injection; Inject 0.35 mLs (35 Units total) into the skin at bedtime.  Dispense: 10 mL; Refill: 4 - Blood Glucose Monitoring Suppl (TRUE METRIX METER) w/Device KIT; Use 3 times daily to check blood sugars  Dispense: 1 kit; Refill: 0 - glucose blood (TRUE METRIX BLOOD GLUCOSE TEST) test strip; Use as instructed to check blood sugars 3 times daily  Dispense: 100 each; Refill: 12 - TRUEplus Lancets 28G MISC; Use to help check blood sugars 3 times daily  Dispense: 1100 each; Refill: 11  2. Hypothyroidism, post thyroidectomy She reports prior thyroidectomy and is now on levothyroxine but has been out of her medication.  New prescription provided and will check TSH in approximately 3 months to make sure that she is on an adequate dose of the medication. - levothyroxine (SYNTHROID) 137 MCG tablet; Take 1 tablet (137 mcg total) by mouth daily before breakfast.  Dispense: 30 tablet; Refill: 4  3. Essential hypertension  Prescriptions provided for amlodipine, valsartan hydrochlorothiazide and metoprolol for control of hypertension and patient is asked to make a nurse visit or follow-up with clinical pharmacist in a few weeks for blood pressure check to make sure that she is on an adequate dose of blood pressure medication.  Low-sodium diet encouraged. - amLODipine (NORVASC) 10 MG tablet; Take 1 tablet (10 mg total) by mouth daily. For blood pressure  Dispense: 30 tablet; Refill: 4 - valsartan-hydrochlorothiazide (DIOVAN-HCT) 320-25 MG tablet; Take 1 tablet by mouth daily. For blood pressure  Dispense: 30 tablet; Refill: 4 - metoprolol succinate (TOPROL-XL) 50 MG 24 hr tablet; Take 1 tablet (50 mg total) by mouth daily.  Dispense: 30  tablet; Refill: 4  4. Hyperlipidemia associated with type 2 diabetes mellitus ([place]) She does not believe that she was previously on cholesterol medication but discussed with patient that statin therapy is usually encouraged to help reduce the risk of heart disease associated with  Diabetes.  She reports that if possible she would like to lower her lipids through diet and nutritional supplements.  Discussed with the patient that lipid panel will be done at a future visit in the next 3 to 6 months and will again discuss the need for possible statin medication.  5. History of colon cancer She reports a history of colon cancer and discussed with the patient that she should apply for the [place] assistance program to help with the cost of her current medical expenses and follow-up as well as for any needed upcoming reevaluation regarding her history of colon cancer.  On review of chart, prior colonoscopy was done by Dr. Collene Mares in gastroenterology and surgery was done by Dr. Lita Mains for hemicolectomy.  Follow Up Instructions: Return in about 4 weeks (around 07/19/2019) for Chronic issues, sooner if needed.     I discussed the assessment and treatment plan with the patient. The patient was  provided an opportunity to ask questions and all were answered. The patient agreed with the plan and demonstrated an understanding of the instructions.   The patient was advised to call back or seek an in-person evaluation if the symptoms worsen or if the condition fails to improve as anticipated.  I provided 14 minutes of non-face-to-face time during this encounter.  An additional 8 to 10 minutes was spent on review of past medical records.   Antony Blackbird, MD

## 2019-06-21 NOTE — Progress Notes (Signed)
Patient verified DOB Patient has taken medication today. Patient has eaten today. Patient complains of slight throat pain beginning yesterday. Patient states she had her thyroid removed in 2012 and as intermittent sore throats. Patient states yesterday she was in the air longer than normal and throat is more sore today. Patient denies any N/V diarrhea, No lost of taste or smell. BP on Sunday was 174/96.

## 2019-06-24 LAB — NOVEL CORONAVIRUS, NAA: SARS-CoV-2, NAA: NOT DETECTED

## 2019-07-16 ENCOUNTER — Ambulatory Visit: Payer: Self-pay | Attending: Family Medicine | Admitting: Pharmacist

## 2019-07-16 ENCOUNTER — Other Ambulatory Visit: Payer: Self-pay

## 2019-07-16 DIAGNOSIS — E1169 Type 2 diabetes mellitus with other specified complication: Secondary | ICD-10-CM

## 2019-07-16 MED ORDER — TRULICITY 0.75 MG/0.5ML ~~LOC~~ SOAJ: 0.7500 mg | SUBCUTANEOUS | 0 refills | Status: DC

## 2019-07-16 MED FILL — TRULICITY 0.75 MG/0.5 ML PE: 0.75 | 28 days supply | Qty: 2 | Fill #0

## 2019-07-16 NOTE — Progress Notes (Signed)
    S:    PCP: Dr. Chapman Fitch  No chief complaint on file.  Patient arrives in good spirits.  Presents for diabetes evaluation, education, and management Patient was referred and last seen by Primary Care Provider on 06/21/19.    Patient reports Diabetes was diagnosed >10 yrs ago. Was tried on metformin but could not tolerate. She has glipizide listed on her profile but is not taking. Per pt, she has been on Levemir since.   Family/Social History:  - DM, heart disease, HTN, kidney disease - Tobacco: former smoker (quit in 1982) - Alcohol: denies   Insurance coverage/medication affordability:  Self pay  Patient reports adherence with medications.  Current diabetes medications include: Levemir 35 units at bedtime Current hypertension medications include: amlodipine 10 mg daily, Diovan-HCT 320-25 mg daily Current hyperlipidemia medications include: none; has tried simvastatin in the past.   Patient denies hypoglycemic events.  Patient reported dietary habits:  - Admits to struggling with sweets  Patient-reported exercise habits:  - None    Patient reports nocturia polyuria, polydipsia.   Patient reports neuropathy (nerve pain). Patient reports visual changes. Patient reports self foot exams.     O:  Physical Exam   ROS   Lab Results  Component Value Date   HGBA1C 11.6 (A) 06/21/2019   There were no vitals filed for this visit.  Lipid Panel  No results found for: CHOL, TRIG, HDL, CHOLHDL, VLDL, LDLCALC, LDLDIRECT  Home fasting blood sugars: 130 - 300s  2 hour post-meal/random blood sugars: 200 - 300s.  Clinical Atherosclerotic Cardiovascular Disease (ASCVD): No  The ASCVD Risk score Mikey Bussing DC Jr., et al., 2013) failed to calculate for the following reasons:   The systolic blood pressure is missing    A/P: Diabetes longstanding currently uncontrolled. Patient is able to verbalize appropriate hypoglycemia management plan. Patient is adherent with medication. Control  is suboptimal due to dietary indiscretion, physical inactivity. She has having symptoms of hyperglycemia. Pt is s/p thyroidectomy. No personal hx of pancreatitis. Discussed option of adding a once weekly GLP RA vs increasing insulin. Pt would like to try Trulicity before adding/increasing insulin.  -Continued Levemir.  -Start Trulicity [id number] mg weekly.  -Extensively discussed pathophysiology of diabetes, recommended lifestyle interventions, dietary effects on blood sugar control -Counseled on s/sx of and management of hypoglycemia -Next A1C anticipated 09/2019.   Written patient instructions provided.  Total time in face to face counseling 15 minutes.   Follow up Pharmacist Clinic Visit in 1 month.  Benard Halsted, PharmD, [place] [phone]

## 2019-07-16 NOTE — Patient Instructions (Signed)
Thank you for coming to see me today. Please do the following:  1. Start Trulicity injections. Take once a week.  2. Continue 35 units of Levemir at bedtime.  3. Continue checking blood sugars at home. 4. Continue making the lifestyle changes we've discussed together during our visit. Diet and exercise play a significant role in improving your blood sugars.  5. Follow-up with me in 1 month.    Hypoglycemia or low blood sugar:   Low blood sugar can happen quickly and may become an emergency if not treated right away.   While this shouldn't happen often, it can be brought upon if you skip a meal or do not eat enough. Also, if your insulin or other diabetes medications are dosed too high, this can cause your blood sugar to go to low.   Warning signs of low blood sugar include: 1. Feeling shaky or dizzy 2. Feeling weak or tired  3. Excessive hunger 4. Feeling anxious or upset  5. Sweating even when you aren't exercising  What to do if I experience low blood sugar? 1. Check your blood sugar with your meter. If lower than 70, proceed to step 2.  2. Treat with 3-4 glucose tablets or 3 packets of regular sugar. If these aren't around, you can try hard candy. Yet another option would be to drink 4 ounces of fruit juice or 6 ounces of REGULAR soda.  3. Re-check your sugar in 15 minutes. If it is still below 70, do what you did in step 2 again. If has come back up, go ahead and eat a snack or small meal at this time.

## 2019-07-17 ENCOUNTER — Encounter: Payer: Self-pay | Admitting: Pharmacist

## 2019-07-17 ENCOUNTER — Encounter: Payer: Self-pay | Admitting: Family Medicine

## 2019-07-25 MED FILL — ?LEVOTHYROXINE 137MCG TABLE: 137 | 30 days supply | Qty: 30 | Fill #1

## 2019-08-13 ENCOUNTER — Ambulatory Visit: Payer: Self-pay | Admitting: Pharmacist

## 2019-08-15 ENCOUNTER — Ambulatory Visit: Payer: Self-pay | Attending: Family Medicine | Admitting: Pharmacist

## 2019-08-15 ENCOUNTER — Other Ambulatory Visit: Payer: Self-pay

## 2019-08-15 ENCOUNTER — Other Ambulatory Visit: Payer: Self-pay | Admitting: Family Medicine

## 2019-08-15 DIAGNOSIS — E1169 Type 2 diabetes mellitus with other specified complication: Secondary | ICD-10-CM

## 2019-08-15 LAB — GLUCOSE, POCT (MANUAL RESULT ENTRY): POC Glucose: 244 mg/dL — AB (ref 70–99)

## 2019-08-15 MED ORDER — ATORVASTATIN CALCIUM 10 MG PO TABS: 10.0000 mg | ORAL_TABLET | Freq: Every day | ORAL | 2 refills | Status: DC

## 2019-08-15 MED ORDER — TRULICITY 0.75 MG/0.5ML ~~LOC~~ SOAJ: 0.7500 mg | SUBCUTANEOUS | 0 refills | Status: DC

## 2019-08-15 MED FILL — !LEVEMIR 100 UNITS/ML VIAL: 100/ML | 29 days supply | Qty: 10 | Fill #1

## 2019-08-15 MED FILL — AMLODIPINE BESYLATE 10 MG T: 10 | 30 days supply | Qty: 30 | Fill #1

## 2019-08-15 MED FILL — TRULICITY 0.75 MG/0.5 ML PE: 0.75 | 30 days supply | Qty: 2 | Fill #0

## 2019-08-15 MED FILL — ?ATORVASTATIN 10 MG TABLET: 10 | 30 days supply | Qty: 30 | Fill #0

## 2019-08-15 NOTE — Progress Notes (Signed)
    S:    PCP: Dr. Chapman Fitch  No chief complaint on file.  Patient arrives in good spirits.  Presents for diabetes evaluation, education, and management Patient was referred and last seen by Primary Care Provider on 06/21/19.    Patient reports Diabetes was diagnosed >10 yrs ago.   Family/Social History:  - DM, heart disease, HTN, kidney disease - Tobacco: former smoker (quit in 1982) - Alcohol: denies   Insurance coverage/medication affordability:  Self pay  Patient reports adherence with medications.  Current diabetes medications include: Levemir 35 units at bedtime, Trulicity 7.98 mg weekly Current hypertension medications include: amlodipine 10 mg daily, Diovan-HCT 320-25 mg daily Current hyperlipidemia medications include: atorvastatin 10 mg daily  Patient denies hypoglycemic events.  Patient reported dietary habits:  - Reports making improvements in diet  Patient-reported exercise habits:  - None    Patient denies nocturia polyuria, polydipsia.   Patient reports neuropathy (nerve pain). Patient reports baseline visual changes. Patient reports self foot exams.     O:  POCT: 244 (ate ~12)  Lab Results  Component Value Date   HGBA1C 11.6 (A) 06/21/2019   There were no vitals filed for this visit.  Lipid Panel  No results found for: CHOL, TRIG, HDL, CHOLHDL, VLDL, LDLCALC, LDLDIRECT  Home fasting blood sugars: 130s 2 hour post-meal/random blood sugars: 200s  Clinical Atherosclerotic Cardiovascular Disease (ASCVD): No  The ASCVD Risk score Mikey Bussing DC Jr., et al., 2013) failed to calculate for the following reasons:   The systolic blood pressure is missing   A/P: Diabetes longstanding currently uncontrolled but reported home CBGs are improving. Patient is able to verbalize appropriate hypoglycemia management plan. Patient is adherent with medication.  Will have her continue on current regimen. Discussed the option of increasing Trulicity dose before she sees her  PCP. She will return in 3 weeks and if CBGs aren't at goal, we will increase dose. She is not fasting today; will have her return in 3 weeks for follow-up and labs. -Continued Levemir.  -Continue Trulicity 9.21 mg weekly.  -Extensively discussed pathophysiology of diabetes, recommended lifestyle interventions, dietary effects on blood sugar control -Counseled on s/sx of and management of hypoglycemia -Next A1C anticipated 09/2019.  -CMP14 +eGFR, future -Urine microalbumin:SCr ratio, future  ASCVD risk - primary prevention in patient with diabetes. Need lipid panel. Recommend to start at least moderate intensity statin.  -Lipid, future -Started atorvastatin 10 mg. Will increase upon review of lipid panel.   HM: pt is due for tetanus, flu, and pneumonia vaccines.  - Deferred to next visit.   Written patient instructions provided.  Total time in face to face counseling 15 minutes.   Follow up Pharmacist Clinic Visit in 3 weeks.  Benard Halsted, PharmD, [place] [phone]

## 2019-08-29 LAB — HM DIABETES EYE EXAM

## 2019-08-29 MED FILL — ?LEVOTHYROXINE 137 MCG TAB: 137 | 30 days supply | Qty: 30 | Fill #2

## 2019-08-29 MED FILL — METOPROLOL SUCCINATE ER 50: 50 | 30 days supply | Qty: 30 | Fill #1

## 2019-09-05 ENCOUNTER — Ambulatory Visit: Payer: Self-pay | Attending: Family Medicine | Admitting: Pharmacist

## 2019-09-05 ENCOUNTER — Other Ambulatory Visit: Payer: Self-pay

## 2019-09-05 DIAGNOSIS — E1169 Type 2 diabetes mellitus with other specified complication: Secondary | ICD-10-CM

## 2019-09-05 MED ORDER — TRULICITY 1.5 MG/0.5ML ~~LOC~~ SOAJ: 1.5000 mg | SUBCUTANEOUS | 2 refills | Status: DC

## 2019-09-05 MED FILL — TRULICITY 1.5 MG/0.5 ML PEN: 1.5 | 28 days supply | Qty: 2 | Fill #0

## 2019-09-05 NOTE — Progress Notes (Signed)
    S:    PCP: Dr. Chapman Fitch  No chief complaint on file.  Patient arrives in good spirits.  Presents for diabetes evaluation, education, and management Patient was referred and last seen by Primary Care Provider on 06/21/19.    Patient reports Diabetes was diagnosed >10 yrs ago.   Family/Social History:  - DM, heart disease, HTN, kidney disease - Tobacco: former smoker (quit in 1982) - Alcohol: denies   Insurance coverage/medication affordability:  Self pay  Patient reports adherence with medications.  Current diabetes medications include: Levemir 35 units at bedtime, Trulicity [id number] mg weekly Current hypertension medications include: amlodipine 10 mg daily, Diovan-HCT 320-25 mg daily Current hyperlipidemia medications include: atorvastatin 10 mg daily  Patient denies hypoglycemic events.  Patient reported dietary habits:  - Reports making improvements in diet  Patient-reported exercise habits:  - None    Patient denies nocturia polyuria, polydipsia.   Patient reports neuropathy (nerve pain). Patient reports baseline visual changes. Patient reports self foot exams.     O:  POCT: 133 (fasting)  Lab Results  Component Value Date   HGBA1C 11.6 (A) 06/21/2019   There were no vitals filed for this visit.  Lipid Panel  No results found for: CHOL, TRIG, HDL, CHOLHDL, VLDL, LDLCALC, LDLDIRECT  Home fasting blood sugars: 130s 2 hour post-meal/random blood sugars: 200s  Clinical Atherosclerotic Cardiovascular Disease (ASCVD): No  The ASCVD Risk score Mikey Bussing DC Jr., et al., 2013) failed to calculate for the following reasons:   The systolic blood pressure is missing   A/P: Diabetes longstanding currently uncontrolled but reported home CBGs are improving. Patient is able to verbalize appropriate hypoglycemia management plan. Patient is adherent with medication. I recommend to increase Trulicity at this time.  -Continued Levemir.  -Increase Trulicity to 1.5 mg weekly.    -Extensively discussed pathophysiology of diabetes, recommended lifestyle interventions, dietary effects on blood sugar control -Counseled on s/sx of and management of hypoglycemia -Next A1C anticipated 09/2019.   Written patient instructions provided.  Total time in face to face counseling 15 minutes.   Follow up w/ PCP in March.   Benard Halsted, PharmD, [place] [phone]

## 2019-09-18 ENCOUNTER — Other Ambulatory Visit: Payer: Self-pay | Admitting: Family Medicine

## 2019-09-18 DIAGNOSIS — E1169 Type 2 diabetes mellitus with other specified complication: Secondary | ICD-10-CM

## 2019-09-18 DIAGNOSIS — E113493 Type 2 diabetes mellitus with severe nonproliferative diabetic retinopathy without macular edema, bilateral: Secondary | ICD-10-CM

## 2019-09-18 NOTE — Progress Notes (Signed)
Patient ID: Brenda [name], female   DOB: 08/06/1960, 59 y.o.   MRN: [id number]   Message received from patient's eye care specialist, Max Himelick, OD (phone [phone]) who saw patient for annual eye exam on 08/29/2019 and patient was found to have severe background diabetic retinopathy and needs referral to diabetes retina specialist.  Referral placed and note from outside physician will be scanned into patient's chart.

## 2019-09-27 MED FILL — METOPROLOL SUCCINATE ER 50: 50 | 30 days supply | Qty: 30 | Fill #2

## 2019-09-27 MED FILL — ?LEVOTHYROXINE 137 MCG TAB: 137 | 30 days supply | Qty: 30 | Fill #3

## 2019-09-27 MED FILL — ?ATORVASTATIN CALCIUM 10MG: 10 | 30 days supply | Qty: 30 | Fill #1

## 2019-09-27 MED FILL — ?AMLODIPINE BESYL 10MG TABL: 10 | 30 days supply | Qty: 30 | Fill #2

## 2019-09-28 MED FILL — TRUE METRIX TEST STRIP: 25 days supply | Qty: 100 | Fill #1

## 2019-10-03 ENCOUNTER — Ambulatory Visit: Payer: Self-pay | Admitting: Family Medicine

## 2019-10-08 MED FILL — TRULICITY 1.5 MG/0.5 ML PEN: 1.5 | 28 days supply | Qty: 2 | Fill #1

## 2019-10-30 MED FILL — AMLODIPINE BESYLATE 10 MG T: 10 | 30 days supply | Qty: 30 | Fill #3

## 2019-10-30 MED FILL — ?LEVOTHYROXINE 137 MCG TAB: 137 | 30 days supply | Qty: 30 | Fill #4

## 2019-10-31 ENCOUNTER — Ambulatory Visit: Payer: Self-pay | Admitting: Family Medicine

## 2019-11-07 ENCOUNTER — Encounter: Payer: Self-pay | Admitting: Family Medicine

## 2019-11-07 ENCOUNTER — Other Ambulatory Visit: Payer: Self-pay

## 2019-11-07 ENCOUNTER — Other Ambulatory Visit: Payer: Self-pay | Admitting: Family Medicine

## 2019-11-07 ENCOUNTER — Ambulatory Visit: Payer: Self-pay | Attending: Family Medicine | Admitting: Family Medicine

## 2019-11-07 VITALS — BP 169/91 | HR 69 | Ht 68.0 in | Wt 237.0 lb

## 2019-11-07 DIAGNOSIS — E039 Hypothyroidism, unspecified: Secondary | ICD-10-CM

## 2019-11-07 DIAGNOSIS — H1011 Acute atopic conjunctivitis, right eye: Secondary | ICD-10-CM

## 2019-11-07 DIAGNOSIS — I10 Essential (primary) hypertension: Secondary | ICD-10-CM

## 2019-11-07 DIAGNOSIS — E1142 Type 2 diabetes mellitus with diabetic polyneuropathy: Secondary | ICD-10-CM

## 2019-11-07 DIAGNOSIS — Z794 Long term (current) use of insulin: Secondary | ICD-10-CM

## 2019-11-07 LAB — GLUCOSE, POCT (MANUAL RESULT ENTRY): POC Glucose: 125 mg/dl — AB (ref 70–99)

## 2019-11-07 MED ORDER — OLOPATADINE HCL 0.1 % OP SOLN: 1.0000 [drp] | Freq: Two times a day (BID) | OPHTHALMIC | 1 refills | Status: DC

## 2019-11-07 MED ORDER — ATORVASTATIN CALCIUM 10 MG PO TABS: 10.0000 mg | ORAL_TABLET | Freq: Every day | ORAL | 2 refills | Status: DC

## 2019-11-07 MED ORDER — GABAPENTIN 100 MG PO CAPS: 100.0000 mg | ORAL_CAPSULE | Freq: Three times a day (TID) | ORAL | 4 refills | Status: DC

## 2019-11-07 MED ORDER — LISINOPRIL-HYDROCHLOROTHIAZIDE 10-12.5 MG PO TABS: 1.0000 | ORAL_TABLET | Freq: Every day | ORAL | 3 refills | Status: DC

## 2019-11-07 MED ORDER — INSULIN DETEMIR 100 UNIT/ML ~~LOC~~ SOLN: 35.0000 [IU] | Freq: Every day | SUBCUTANEOUS | 4 refills | Status: DC

## 2019-11-07 MED ORDER — METOPROLOL SUCCINATE ER 50 MG PO TB24: 50.0000 mg | ORAL_TABLET | Freq: Every day | ORAL | 4 refills | Status: DC

## 2019-11-07 MED FILL — GABAPENTIN 100 MG CAPSULE: 100 | 20 days supply | Qty: 60 | Fill #0

## 2019-11-07 MED FILL — OLOPATADINE HCL 0.1 % SOLN: 0.1 | 37 days supply | Qty: 5 | Fill #0

## 2019-11-07 MED FILL — $LEVEMIR 100U/ML VIAL: 100 | 28 days supply | Qty: 10 | Fill #0

## 2019-11-07 MED FILL — LISINOPRIL-HCTZ 10-12.5 MG: 10-12.5 | 30 days supply | Qty: 30 | Fill #0

## 2019-11-07 MED FILL — METOPROLOL SUCCINATE ER 50: 50 | 30 days supply | Qty: 30 | Fill #0

## 2019-11-07 MED FILL — ?ATORVASTATIN CALCIUM 10MG: 10 | 30 days supply | Qty: 30 | Fill #0

## 2019-11-07 NOTE — Progress Notes (Signed)
Patient only took Lisinopril this morning.

## 2019-11-07 NOTE — Progress Notes (Signed)
Subjective:  Patient ID: Brenda [name], female    DOB: [birth year]  Age: 59 y.o. MRN: [id number]  CC: Diabetes   HPI Brenda [name] is a 59 year old female with history of, type 2 diabetes mellitus (A1c 11.6), hypertension, hypothyroidism here for chronic disease management.  She discontinued Valsartan/HCTZ due to headaches and restrated Lisinopril/HCTZ which she previously had.  Her blood pressure is elevated today and she endorses taking her lisinopril but not the other antihypertensives. Sugars are 119-130. No hypoglycemia. Sometimes has neuropathy oin feet and has been out of gabapentin. Last eye exam was this year.  She denies presents for visual abnormalities but complains that R eye keeps draining and is sometimes itchy.  She has not had a recent thyroid panel but has been compliant with her levothyroxine. She has no additional concerns today.  Past Medical History:  Diagnosis Date  . Arthritis   . Colon cancer ([place]) 09/08/09   Colonoscopy by Dr Collene Mares; Surgery by Dr Donne Hazel  . Diabetes mellitus   . Family history of breast cancer    aunt and cousin on father's side  . Family history of colon cancer    grandmother - father's side  . Family history of prostate cancer    uncle - (father's brother)  . Fatigue   . Headache(784.0)   . Hemorrhoid   . Hyperlipidemia   . Hypertension   . Night sweats   . Thyroid disease   . Wears glasses     Past Surgical History:  Procedure Laterality Date  . ABDOMINAL HYSTERECTOMY    . APPENDECTOMY    . CYST REMOVED     BACK OF HEAD  . RIGHT COLECTOMY  09-09-09   FOR COLON CANCER  . THYROID SURGERY  09/02/10   Clarify with patient; BENIGN PER PT  . TUBAL LIGATION      Family History  Problem Relation Age of Onset  . Diabetes Father   . Heart disease Father   . Hypertension Father   . Kidney disease Father   . Diabetes Sister   . Diabetes Brother   . Colon cancer Paternal Grandmother 57    No Known Allergies  Outpatient  Medications Prior to Visit  Medication Sig Dispense Refill  . acetaminophen (TYLENOL) 500 MG tablet Take 1,000 mg by mouth every 6 (six) hours as needed for mild pain or headache.    Marland Kitchen amLODipine (NORVASC) 10 MG tablet Take 1 tablet (10 mg total) by mouth daily. For blood pressure 30 tablet 4  . atorvastatin (LIPITOR) 10 MG tablet Take 1 tablet (10 mg total) by mouth daily. 30 tablet 2  . Blood Glucose Monitoring Suppl (TRUE METRIX METER) w/Device KIT Use 3 times daily to check blood sugars 1 kit 0  . CINNAMON PO Take 1,000 tablets by mouth daily.    . Dulaglutide (TRULICITY) 1.5 KT/6.2BW SOPN Inject 1.5 mg into the skin once a week. 2 mL 2  . gabapentin (NEURONTIN) 100 MG capsule Take 1 capsule (100 mg total) by mouth 3 (three) times daily. 60 capsule 0  . glucose blood (TRUE METRIX BLOOD GLUCOSE TEST) test strip Use as instructed to check blood sugars 3 times daily 100 each 12  . insulin detemir (LEVEMIR) 100 UNIT/ML injection Inject 0.35 mLs (35 Units total) into the skin at bedtime. 10 mL 4  . levothyroxine (SYNTHROID) 137 MCG tablet Take 1 tablet (137 mcg total) by mouth daily before breakfast. 30 tablet 4  . lisinopril (ZESTRIL) 10 MG tablet  Take by mouth.    . metoprolol succinate (TOPROL-XL) 50 MG 24 hr tablet Take 1 tablet (50 mg total) by mouth daily. 30 tablet 4  . TRUEplus Lancets 28G MISC Use to help check blood sugars 3 times daily 1100 each 11  . TURMERIC PO Take 1 tablet by mouth daily.    . valsartan-hydrochlorothiazide (DIOVAN-HCT) 320-25 MG tablet Take 1 tablet by mouth daily. For blood pressure (Patient not taking: Reported on 11/07/2019) 30 tablet 4   No facility-administered medications prior to visit.     ROS Review of Systems  Constitutional: Negative for activity change, appetite change and fatigue.  HENT: Negative for congestion, sinus pressure and sore throat.   Eyes: Positive for discharge. Negative for visual disturbance.  Respiratory: Negative for cough, chest  tightness, shortness of breath and wheezing.   Cardiovascular: Negative for chest pain and palpitations.  Gastrointestinal: Negative for abdominal distention, abdominal pain and constipation.  Endocrine: Negative for polydipsia.  Genitourinary: Negative for dysuria and frequency.  Musculoskeletal: Negative for arthralgias and back pain.  Skin: Negative for rash.  Neurological: Negative for tremors, light-headedness and numbness.  Hematological: Does not bruise/bleed easily.  Psychiatric/Behavioral: Negative for agitation and behavioral problems.    Objective:  BP (!) 169/91   Pulse 69   Ht 5' 8"  (1.727 m)   Wt 237 lb (107.5 kg)   SpO2 99%   BMI 36.04 kg/m   BP/Weight 11/07/2019 06/13/2017 8/55/0158  Systolic BP 682 574 935  Diastolic BP 91 92 88  Wt. (Lbs) 237 - -  BMI 36.04 - -      Physical Exam Constitutional:      Appearance: She is well-developed.  Neck:     Vascular: No JVD.  Cardiovascular:     Rate and Rhythm: Normal rate.     Heart sounds: Normal heart sounds. No murmur.  Pulmonary:     Effort: Pulmonary effort is normal.     Breath sounds: Normal breath sounds. No wheezing or rales.  Chest:     Chest wall: No tenderness.  Abdominal:     General: Bowel sounds are normal. There is no distension.     Palpations: Abdomen is soft. There is no mass.     Tenderness: There is no abdominal tenderness.  Musculoskeletal:        General: Normal range of motion.     Right lower leg: No edema.     Left lower leg: No edema.  Neurological:     Mental Status: She is alert and oriented to person, place, and time.  Psychiatric:        Mood and Affect: Mood normal.     CMP Latest Ref Rng & Units 06/13/2017 08/28/2016 05/29/2016  Glucose 65 - 99 mg/dL 180(H) 396(H) 380(H)  BUN 6 - 20 mg/dL 20 16 13   Creatinine 0.44 - 1.00 mg/dL 0.78 0.90 0.80  Sodium 135 - 145 mmol/L 136 134(L) 136  Potassium 3.5 - 5.1 mmol/L 3.8 3.2(L) 3.6  Chloride 101 - 111 mmol/L 101 93(L) 98(L)   CO2 22 - 32 mmol/L 24 27 24   Calcium 8.9 - 10.3 mg/dL 9.2 9.3 9.2  Total Protein 6.5 - 8.1 g/dL - 7.1 6.8  Total Bilirubin 0.3 - 1.2 mg/dL - 0.6 0.8  Alkaline Phos 38 - 126 U/L - 82 90  AST 15 - 41 U/L - 21 26  ALT 14 - 54 U/L - 16 24    Lipid Panel  No results found for: CHOL, TRIG,  HDL, CHOLHDL, VLDL, LDLCALC, LDLDIRECT  CBC    Component Value Date/Time   WBC 10.6 (H) 06/13/2017 1642   RBC 4.90 06/13/2017 1642   HGB 13.4 06/13/2017 1642   HGB 11.9 10/28/2011 0936   HCT 40.6 06/13/2017 1642   HCT 35.9 10/28/2011 0936   PLT 230 06/13/2017 1642   PLT 202 10/28/2011 0936   MCV 82.9 06/13/2017 1642   MCV 82.3 10/28/2011 0936   MCH 27.3 06/13/2017 1642   MCHC 33.0 06/13/2017 1642   RDW 14.6 06/13/2017 1642   RDW 15.1 (H) 10/28/2011 0936   LYMPHSABS 3.7 06/13/2017 1642   LYMPHSABS 1.8 10/28/2011 0936   MONOABS 0.7 06/13/2017 1642   MONOABS 0.5 10/28/2011 0936   EOSABS 0.1 06/13/2017 1642   EOSABS 0.1 10/28/2011 0936   BASOSABS 0.0 06/13/2017 1642   BASOSABS 0.0 10/28/2011 0936    Lab Results  Component Value Date   HGBA1C 11.6 (A) 06/21/2019     Lab Results  Component Value Date   TSH 108.823 (H) 04/06/2011    Assessment & Plan:   1. Type 2 diabetes mellitus with diabetic polyneuropathy, with long-term current use of insulin (HCC) Uncontrolled with A1c of 11.6 We will send of A1c and adjust regimen accordingly Counseled on Diabetic diet, my plate method, 497 minutes of moderate intensity exercise/week Blood sugar logs with fasting goals of 80-120 mg/dl, random of less than 180 and in the event of sugars less than 60 mg/dl or greater than 400 mg/dl encouraged to notify the clinic. Advised on the need for annual eye exams, annual foot exams, Pneumonia vaccine. - POCT glucose (manual entry) - Hemoglobin A1c - Lipid panel - CMP14+EGFR - insulin detemir (LEVEMIR) 100 UNIT/ML injection; Inject 0.35 mLs (35 Units total) into the skin at bedtime.  Dispense: 10 mL;  Refill: 4 - atorvastatin (LIPITOR) 10 MG tablet; Take 1 tablet (10 mg total) by mouth daily.  Dispense: 30 tablet; Refill: 2 - gabapentin (NEURONTIN) 100 MG capsule; Take 1 capsule (100 mg total) by mouth 3 (three) times daily.  Dispense: 60 capsule; Refill: 4  2. Essential hypertension Uncontrolled Blood pressure is elevated as she took only lisinopril this morning  I have refilled all of her other medications - metoprolol succinate (TOPROL-XL) 50 MG 24 hr tablet; Take 1 tablet (50 mg total) by mouth daily.  Dispense: 30 tablet; Refill: 4 - lisinopril-hydrochlorothiazide (ZESTORETIC) 10-12.5 MG tablet; Take 1 tablet by mouth daily.  Dispense: 90 tablet; Refill: 3  3. Hypothyroidism, post thyroidectomy Uncontrolled Will send off Thyroid panel and adjust regimen accordingly - T4, free - TSH  4. Allergic conjunctivitis of right eye - olopatadine (PATANOL) 0.1 % ophthalmic solution; Place 1 drop into the right eye 2 (two) times daily.  Dispense: 5 mL; Refill: 1  Follow-up: Return in about 3 months (around 02/06/2020) for Dr Chapman Fitch - chronic disease management.       Charlott Rakes, MD, FAAFP. [facility] [place], [place]   11/07/2019, 10:46 AM

## 2019-11-08 ENCOUNTER — Other Ambulatory Visit: Payer: Self-pay | Admitting: Family Medicine

## 2019-11-08 DIAGNOSIS — E039 Hypothyroidism, unspecified: Secondary | ICD-10-CM

## 2019-11-08 LAB — T4, FREE: Free T4: 1.79 ng/dL — ABNORMAL HIGH (ref 0.82–1.77)

## 2019-11-08 LAB — CMP14+EGFR
ALT: 20 IU/L (ref 0–32)
AST: 15 IU/L (ref 0–40)
Albumin/Globulin Ratio: 1.4 (ref 1.2–2.2)
Albumin: 4.2 g/dL (ref 3.8–4.8)
Alkaline Phosphatase: 104 IU/L (ref 39–117)
BUN/Creatinine Ratio: 21 (ref 12–28)
BUN: 18 mg/dL (ref 8–27)
Bilirubin Total: 0.3 mg/dL (ref 0.0–1.2)
CO2: 21 mmol/L (ref 20–29)
Calcium: 9.7 mg/dL (ref 8.7–10.3)
Chloride: 101 mmol/L (ref 96–106)
Creatinine, Ser: 0.87 mg/dL (ref 0.57–1.00)
GFR calc Af Amer: 83 mL/min/{1.73_m2} (ref >59)
GFR calc non Af Amer: 72 mL/min/{1.73_m2} (ref >59)
Globulin, Total: 2.9 g/dL (ref 1.5–4.5)
Glucose: 130 mg/dL — ABNORMAL HIGH (ref 65–99)
Potassium: 4.2 mmol/L (ref 3.5–5.2)
Sodium: 139 mmol/L (ref 134–144)
Total Protein: 7.1 g/dL (ref 6.0–8.5)

## 2019-11-08 LAB — LIPID PANEL
Chol/HDL Ratio: 4.9 ratio — ABNORMAL HIGH (ref 0.0–4.4)
Cholesterol, Total: 162 mg/dL (ref 100–199)
HDL: 33 mg/dL — ABNORMAL LOW (ref >39)
LDL Chol Calc (NIH): 102 mg/dL — ABNORMAL HIGH (ref 0–99)
Triglycerides: 153 mg/dL — ABNORMAL HIGH (ref 0–149)
VLDL Cholesterol Cal: 27 mg/dL (ref 5–40)

## 2019-11-08 LAB — HEMOGLOBIN A1C
Est. average glucose Bld gHb Est-mCnc: 163 mg/dL
Hgb A1c MFr Bld: 7.3 % — ABNORMAL HIGH (ref 4.8–5.6)

## 2019-11-08 LAB — TSH: TSH: 0.313 u[IU]/mL — ABNORMAL LOW (ref 0.450–4.500)

## 2019-11-08 MED ORDER — LEVOTHYROXINE SODIUM 125 MCG PO TABS: 125.0000 ug | ORAL_TABLET | Freq: Every day | ORAL | 3 refills | Status: DC

## 2019-12-03 ENCOUNTER — Other Ambulatory Visit: Payer: Self-pay | Admitting: Family Medicine

## 2019-12-03 DIAGNOSIS — E039 Hypothyroidism, unspecified: Secondary | ICD-10-CM

## 2019-12-03 MED FILL — LISINOPRIL-HCTZ 10-12.5 MG: 10-12.5 | 30 days supply | Qty: 30 | Fill #1

## 2019-12-03 MED FILL — AMLODIPINE BESYLATE 10 MG T: 10 | 30 days supply | Qty: 30 | Fill #4

## 2019-12-03 MED FILL — METOPROLOL SUCCINATE ER 50: 50 | 30 days supply | Qty: 30 | Fill #1

## 2019-12-03 MED FILL — OLOPATADINE HCL 0.1 % SOLN: 0.1 | 37 days supply | Qty: 5 | Fill #1

## 2019-12-03 MED FILL — GABAPENTIN 100 MG CAPSULE: 100 | 20 days supply | Qty: 60 | Fill #1

## 2019-12-05 ENCOUNTER — Other Ambulatory Visit: Payer: Self-pay

## 2019-12-05 DIAGNOSIS — E039 Hypothyroidism, unspecified: Secondary | ICD-10-CM

## 2019-12-05 MED ORDER — LEVOTHYROXINE SODIUM 125 MCG PO TABS: 125.0000 ug | ORAL_TABLET | Freq: Every day | ORAL | 3 refills | Status: DC

## 2019-12-05 MED FILL — ?LEVOTHYROXINE 125 MCG TABL: 125 | 30 days supply | Qty: 30 | Fill #0

## 2020-01-02 ENCOUNTER — Other Ambulatory Visit: Payer: Self-pay | Admitting: Family Medicine

## 2020-01-02 DIAGNOSIS — I10 Essential (primary) hypertension: Secondary | ICD-10-CM

## 2020-01-02 MED FILL — ?LEVOTHYROXINE 125 MCG TABL: 125 | 30 days supply | Qty: 30 | Fill #1

## 2020-01-02 MED FILL — LISINOPRIL-HCTZ 10-12.5 MG: 10-12.5 | 30 days supply | Qty: 30 | Fill #2

## 2020-01-02 MED FILL — ?AMLODIPINE BESYL 10MG TABL: 10 | 30 days supply | Qty: 30 | Fill #0

## 2020-01-25 ENCOUNTER — Other Ambulatory Visit: Payer: Self-pay | Admitting: Family Medicine

## 2020-01-25 DIAGNOSIS — Z794 Long term (current) use of insulin: Secondary | ICD-10-CM

## 2020-01-25 DIAGNOSIS — I10 Essential (primary) hypertension: Secondary | ICD-10-CM

## 2020-01-25 MED ORDER — GABAPENTIN 100 MG PO CAPS: 100.0000 mg | ORAL_CAPSULE | Freq: Three times a day (TID) | ORAL | 4 refills | Status: DC

## 2020-01-25 MED ORDER — METOPROLOL SUCCINATE ER 50 MG PO TB24: 50.0000 mg | ORAL_TABLET | Freq: Every day | ORAL | 4 refills | Status: DC

## 2020-01-25 NOTE — Telephone Encounter (Signed)
Medication Refill - Medication: gabapentin (NEURONTIN) 100 MG capsule,metoprolol succinate (TOPROL-XL) 50 MG 24 hr tablet     Has the patient contacted their pharmacy? Yes (Agent: If no, request that the patient contact the pharmacy for the refill.) (Agent: If yes, when and what did the pharmacy advise?)Contact PCP  Preferred Pharmacy (with phone number or street name):  [facility], [address] Phone:  [phone]  Fax:  [phone]       Agent: Please be advised that RX refills may take up to 3 business days. We ask that you follow-up with your pharmacy.

## 2020-01-28 MED FILL — GABAPENTIN 100 MG CAPSULE: 100 | 20 days supply | Qty: 60 | Fill #0

## 2020-01-28 MED FILL — METOPROLOL SUCCINATE ER 50: 50 | 30 days supply | Qty: 30 | Fill #0

## 2020-02-05 MED FILL — ?LEVOTHYROXINE 125 MCG TABL: 125 | 30 days supply | Qty: 30 | Fill #2

## 2020-02-06 ENCOUNTER — Ambulatory Visit: Payer: Self-pay | Admitting: Family Medicine

## 2020-02-06 MED FILL — ?AMLODIPINE BESYL 10MG TABL: 10 | 30 days supply | Qty: 30 | Fill #1

## 2020-02-06 MED FILL — LISINOPRIL-HCTZ 10-12.5 MG: 10-12.5 | 30 days supply | Qty: 30 | Fill #3

## 2020-02-07 MED FILL — TRULICITY 1.5 MG/0.5 ML PEN: 1.5 | 28 days supply | Qty: 2 | Fill #2

## 2020-02-29 MED FILL — ?METOPROLOL SUCC ER 50M TAB: 50 | 30 days supply | Qty: 30 | Fill #1

## 2020-03-06 MED FILL — LISINOPRIL-HCTZ 10-12.5 MG: 10-12.5 | 30 days supply | Qty: 30 | Fill #4

## 2020-03-06 MED FILL — AMLODIPINE BESYLATE 10 MG T: 10 | 30 days supply | Qty: 30 | Fill #2

## 2020-03-06 MED FILL — ?LEVOTHYROXINE 125 MCG TABL: 125 | 30 days supply | Qty: 30 | Fill #3

## 2020-03-19 ENCOUNTER — Encounter: Payer: Self-pay | Admitting: Nurse Practitioner

## 2020-03-19 ENCOUNTER — Other Ambulatory Visit: Payer: Self-pay | Admitting: Nurse Practitioner

## 2020-03-19 ENCOUNTER — Other Ambulatory Visit: Payer: Self-pay

## 2020-03-19 ENCOUNTER — Ambulatory Visit: Payer: Self-pay | Attending: Family Medicine | Admitting: Nurse Practitioner

## 2020-03-19 DIAGNOSIS — H1011 Acute atopic conjunctivitis, right eye: Secondary | ICD-10-CM

## 2020-03-19 DIAGNOSIS — E1169 Type 2 diabetes mellitus with other specified complication: Secondary | ICD-10-CM

## 2020-03-19 DIAGNOSIS — E039 Hypothyroidism, unspecified: Secondary | ICD-10-CM

## 2020-03-19 DIAGNOSIS — J00 Acute nasopharyngitis [common cold]: Secondary | ICD-10-CM

## 2020-03-19 DIAGNOSIS — I10 Essential (primary) hypertension: Secondary | ICD-10-CM

## 2020-03-19 DIAGNOSIS — Z1159 Encounter for screening for other viral diseases: Secondary | ICD-10-CM

## 2020-03-19 MED ORDER — FLUTICASONE PROPIONATE 50 MCG/ACT NA SUSP: 2.0000 | Freq: Every day | NASAL | 6 refills | Status: DC

## 2020-03-19 MED ORDER — OLOPATADINE HCL 0.1 % OP SOLN: 1.0000 [drp] | Freq: Two times a day (BID) | OPHTHALMIC | 1 refills | Status: DC

## 2020-03-19 MED ORDER — TRULICITY 1.5 MG/0.5ML ~~LOC~~ SOAJ: 1.5000 mg | SUBCUTANEOUS | 2 refills | Status: DC

## 2020-03-19 MED ORDER — BENZONATATE 100 MG PO CAPS: 100.0000 mg | ORAL_CAPSULE | Freq: Two times a day (BID) | ORAL | 0 refills | Status: DC | PRN

## 2020-03-19 MED ORDER — GUAIFENESIN-DM 100-10 MG/5ML PO SYRP: 5.0000 mL | ORAL_SOLUTION | Freq: Four times a day (QID) | ORAL | 0 refills | Status: DC | PRN

## 2020-03-19 MED FILL — BENZONATATE 100 MG CAPS: 100 | 15 days supply | Qty: 30 | Fill #0

## 2020-03-19 MED FILL — OLOPATADINE HCL 0.1 % SOLN: 0.1 | 25 days supply | Qty: 5 | Fill #0

## 2020-03-19 MED FILL — FLUTICASONE PROP 50 MCG SPR: 50 | 30 days supply | Qty: 16 | Fill #0

## 2020-03-19 NOTE — Progress Notes (Signed)
Virtual Visit via Telephone Note Due to national recommendations of social distancing due to [place] 19, telehealth visit is felt to be most appropriate for this patient at this time.  I discussed the limitations, risks, security and privacy concerns of performing an evaluation and management service by telephone and the availability of in person appointments. I also discussed with the patient that there may be a patient responsible charge related to this service. The patient expressed understanding and agreed to proceed.    I connected with Wardell Honour on 03/19/20  at   9:50 AM EDT  EDT by telephone and verified that I am speaking with the correct person using two identifiers.   Consent I discussed the limitations, risks, security and privacy concerns of performing an evaluation and management service by telephone and the availability of in person appointments. I also discussed with the patient that there may be a patient responsible charge related to this service. The patient expressed understanding and agreed to proceed.   Location of Patient: Private Residence   Location of Provider: [place] and [place] participating in Telemedicine visit: Geryl Rankins FNP-BC [place] C Schlag[facility]    History of Present Illness: Telemedicine visit for: Medication refill Endorses symptoms of dry cough, sore throat and sneezing with rhinorrhea. Has not been tested for COVID. Has not been vaccinated PMH: Colon cancer (09/08/09), DM2, HPL, HTN, Hypothyroidism.   DM TYPE 2 Not at goal. She does not monitor her blood glucose levels daily. Last reading was several days ago which she states was 138 fasting. Current medications.  Current medications include Levemir 35 units at bedtime and Trulicity 1.5 mg weekly.  Blood pressure and LDL cholesterol are not at goal.  Based on lack of consistency and monitoring of blood glucose levels at home there may also be also a  component of medication nonadherence. Lab Results  Component Value Date   HGBA1C 7.3 (H) 11/07/2019   Lab Results  Component Value Date   LDLCALC 102 (H) 11/07/2019    Essential Hypertension Blood pressure is poorly controlled.  Current medications include amlodipine 10 mg daily, Toprol-XL 50 mg daily and lisinopril-hydrochlorothiazide 10-12.5 mg daily. Denies chest pain, shortness of breath, palpitations, lightheadedness, dizziness, headaches or BLE edema.  BP Readings from Last 3 Encounters:  11/07/19 (!) 169/91  06/13/17 (!) 159/92  08/28/16 149/88   Hypothyroidism She is postsurgical hypothyroidism currently prescribed Synthroid 125 mcg daily however thyroid level is abnormal.  Last thyroid level was checked in April and prior to that had not been checked for 8 years prior. Lab Results  Component Value Date   TSH 0.313 (L) 11/07/2019   Past Medical History:  Diagnosis Date  . Arthritis   . Colon cancer ([place]) 09/08/09   Colonoscopy by Dr Collene Mares; Surgery by Dr Donne Hazel  . Diabetes mellitus   . Family history of breast cancer    aunt and cousin on father's side  . Family history of colon cancer    grandmother - father's side  . Family history of prostate cancer    uncle - (father's brother)  . Fatigue   . Headache(784.0)   . Hemorrhoid   . Hyperlipidemia   . Hypertension   . Night sweats   . Thyroid disease   . Wears glasses     Past Surgical History:  Procedure Laterality Date  . ABDOMINAL HYSTERECTOMY    . APPENDECTOMY    . CYST REMOVED  BACK OF HEAD  . RIGHT COLECTOMY  09-09-09   FOR COLON CANCER  . THYROID SURGERY  09/02/10   Clarify with patient; BENIGN PER PT  . TUBAL LIGATION      Family History  Problem Relation Age of Onset  . Diabetes Father   . Heart disease Father   . Hypertension Father   . Kidney disease Father   . Diabetes Sister   . Diabetes Brother   . Colon cancer Paternal Grandmother 80    Social History   Socioeconomic History   . Marital status: Married    Spouse name: Not on file  . Number of children: Not on file  . Years of education: Not on file  . Highest education level: Not on file  Occupational History  . Not on file  Tobacco Use  . Smoking status: Former Smoker    Quit date: 04/05/1981    Years since quitting: 38.9  . Smokeless tobacco: Never Used  Substance and Sexual Activity  . Alcohol use: No  . Drug use: No  . Sexual activity: Not Currently  Other Topics Concern  . Not on file  Social History Narrative  . Not on file   Social Determinants of Health   Financial Resource Strain:   . Difficulty of Paying Living Expenses: Not on file  Food Insecurity:   . Worried About Charity fundraiser in the Last Year: Not on file  . Ran Out of Food in the Last Year: Not on file  Transportation Needs:   . Lack of Transportation (Medical): Not on file  . Lack of Transportation (Non-Medical): Not on file  Physical Activity:   . Days of Exercise per Week: Not on file  . Minutes of Exercise per Session: Not on file  Stress:   . Feeling of Stress : Not on file  Social Connections:   . Frequency of Communication with Friends and Family: Not on file  . Frequency of Social Gatherings with Friends and Family: Not on file  . Attends Religious Services: Not on file  . Active Member of Clubs or Organizations: Not on file  . Attends Archivist Meetings: Not on file  . Marital Status: Not on file     Observations/Objective: Awake, alert and oriented x 3   Review of Systems  Constitutional: Negative for fever, malaise/fatigue and weight loss.  HENT: Positive for congestion and sore throat. Negative for nosebleeds.   Eyes: Positive for discharge and redness. Negative for blurred vision, double vision and photophobia.  Respiratory: Positive for cough. Negative for shortness of breath.   Cardiovascular: Negative.  Negative for chest pain, palpitations and leg swelling.  Gastrointestinal:  Negative.  Negative for heartburn, nausea and vomiting.  Musculoskeletal: Negative.  Negative for myalgias.  Neurological: Negative.  Negative for dizziness, focal weakness, seizures and headaches.  Psychiatric/Behavioral: Negative.  Negative for suicidal ideas.    Assessment and Plan: Neyda was seen today for follow-up.  Diagnoses and all orders for this visit:  Type 2 diabetes mellitus with other specified complication, unspecified whether long term insulin use (HCC) -     Dulaglutide (TRULICITY) 1.5 JM/4.2AS SOPN; Inject 0.5 mLs (1.5 mg total) into the skin once a week. -     Hemoglobin A1c; Future -     Basic metabolic panel; Future -     Microalbumin / creatinine urine ratio; Future  Essential hypertension  Allergic conjunctivitis of right eye -     olopatadine (PATANOL) 0.1 %  ophthalmic solution; Place 1 drop into the right eye 2 (two) times daily.  Rhinopharyngitis -     fluticasone (FLONASE) 50 MCG/ACT nasal spray; Place 2 sprays into both nostrils daily. -     benzonatate (TESSALON) 100 MG capsule; Take 1 capsule (100 mg total) by mouth 2 (two) times daily as needed for cough. -     guaiFENesin-dextromethorphan (ROBITUSSIN DM) 100-10 MG/5ML syrup; Take 5 mLs by mouth every 6 (six) hours as needed for cough.  Hypothyroidism, post thyroidectomy -     Thyroid Panel With TSH; Future  Need for hepatitis C screening test -     Hepatitis C Antibody; Future     Follow Up Instructions Return in about 4 weeks (around 04/16/2020).     I discussed the assessment and treatment plan with the patient. The patient was provided an opportunity to ask questions and all were answered. The patient agreed with the plan and demonstrated an understanding of the instructions.   The patient was advised to call back or seek an in-person evaluation if the symptoms worsen or if the condition fails to improve as anticipated.  I provided 18 minutes of non-face-to-face time during this encounter  including median intraservice time, reviewing previous notes, labs, imaging, medications and explaining diagnosis and management.  Gildardo Pounds, FNP-BC

## 2020-03-20 LAB — BASIC METABOLIC PANEL
BUN/Creatinine Ratio: 24 (ref 12–28)
BUN: 22 mg/dL (ref 8–27)
CO2: 25 mmol/L (ref 20–29)
Calcium: 9.9 mg/dL (ref 8.7–10.3)
Chloride: 102 mmol/L (ref 96–106)
Creatinine, Ser: 0.9 mg/dL (ref 0.57–1.00)
GFR calc Af Amer: 80 mL/min/{1.73_m2} (ref >59)
GFR calc non Af Amer: 69 mL/min/{1.73_m2} (ref >59)
Glucose: 155 mg/dL — ABNORMAL HIGH (ref 65–99)
Potassium: 4.2 mmol/L (ref 3.5–5.2)
Sodium: 141 mmol/L (ref 134–144)

## 2020-03-20 LAB — MICROALBUMIN / CREATININE URINE RATIO
Creatinine, Urine: 86.4 mg/dL
Microalb/Creat Ratio: 93 mg/g creat — ABNORMAL HIGH (ref 0–29)
Microalbumin, Urine: 80.4 ug/mL

## 2020-03-20 LAB — HEMOGLOBIN A1C
Est. average glucose Bld gHb Est-mCnc: 166 mg/dL
Hgb A1c MFr Bld: 7.4 % — ABNORMAL HIGH (ref 4.8–5.6)

## 2020-03-20 LAB — THYROID PANEL WITH TSH
Free Thyroxine Index: 2.4 (ref 1.2–4.9)
T3 Uptake Ratio: 31 % (ref 24–39)
T4, Total: 7.6 ug/dL (ref 4.5–12.0)
TSH: 0.598 u[IU]/mL (ref 0.450–4.500)

## 2020-03-20 LAB — HEPATITIS C ANTIBODY: Hep C Virus Ab: <0.1 s/co ratio (ref 0.0–0.9)

## 2020-04-07 ENCOUNTER — Other Ambulatory Visit: Payer: Self-pay | Admitting: Family Medicine

## 2020-04-07 DIAGNOSIS — E039 Hypothyroidism, unspecified: Secondary | ICD-10-CM

## 2020-04-07 DIAGNOSIS — I10 Essential (primary) hypertension: Secondary | ICD-10-CM

## 2020-04-07 MED FILL — ?LEVOTHYROXINE 125 MCG TABL: 125 | 30 days supply | Qty: 30 | Fill #0

## 2020-04-07 MED FILL — LISINOPRIL-HCTZ 10-12.5 MG: 10-12.5 | 30 days supply | Qty: 30 | Fill #5

## 2020-04-07 MED FILL — AMLODIPINE BESYLATE 10 MG T: 10 | 30 days supply | Qty: 30 | Fill #0

## 2020-04-10 MED FILL — ?METOPROLOL SUCC ER 50M TAB: 50 | 30 days supply | Qty: 30 | Fill #2

## 2020-04-16 ENCOUNTER — Ambulatory Visit: Payer: Self-pay | Admitting: Family Medicine

## 2020-04-28 MED FILL — GABAPENTIN 100 MG CAPSULE: 100 | 20 days supply | Qty: 60 | Fill #1

## 2020-04-28 MED FILL — OLOPATADINE HCL 0.1 % SOLN: 0.1 | 25 days supply | Qty: 5 | Fill #1

## 2020-05-12 MED FILL — AMLODIPINE BESYLATE 10 MG T: 10 | 30 days supply | Qty: 30 | Fill #1

## 2020-05-12 MED FILL — ?LEVOTHYROXINE 125 MCG TABL: 125 | 30 days supply | Qty: 30 | Fill #1

## 2020-05-12 MED FILL — LISINOPRIL-HCTZ 10-12.5 MG: 10-12.5 | 30 days supply | Qty: 30 | Fill #6

## 2020-05-12 MED FILL — ?METOPROLOL SUCC ER 50M TAB: 50 | 30 days supply | Qty: 30 | Fill #3

## 2020-05-16 MED FILL — GABAPENTIN 100 MG CAPSULE: 100 | 20 days supply | Qty: 60 | Fill #2

## 2020-06-09 ENCOUNTER — Ambulatory Visit: Payer: Self-pay

## 2020-06-10 MED FILL — METOPROLOL SUCCINATE ER 50: 50 | 30 days supply | Qty: 30 | Fill #4

## 2020-06-10 MED FILL — LISINOPRIL-HCTZ 10-12.5 MG: 10-12.5 | 30 days supply | Qty: 30 | Fill #7

## 2020-06-10 MED FILL — AMLODIPINE BESYLATE 10 MG T: 10 | 30 days supply | Qty: 30 | Fill #2

## 2020-06-10 MED FILL — ?LEVOTHYROXINE 125 MCG TABL: 125 | 30 days supply | Qty: 30 | Fill #2

## 2020-06-24 MED FILL — GABAPENTIN 100 MG CAPSULE: 100 | 20 days supply | Qty: 60 | Fill #3

## 2020-07-09 ENCOUNTER — Other Ambulatory Visit: Payer: Self-pay | Admitting: Pharmacist

## 2020-07-09 DIAGNOSIS — I10 Essential (primary) hypertension: Secondary | ICD-10-CM

## 2020-07-09 MED ORDER — AMLODIPINE BESYLATE 10 MG PO TABS: 10.0000 mg | ORAL_TABLET | Freq: Every day | ORAL | 0 refills | Status: DC

## 2020-07-09 MED FILL — LISINOPRIL-HCTZ 10-12.5 MG: 10-12.5 | 30 days supply | Qty: 30 | Fill #8

## 2020-07-09 MED FILL — AMLODIPINE BESYLATE 10 MG T: 10 | 30 days supply | Qty: 30 | Fill #0

## 2020-07-09 MED FILL — ?LEVOTHYROXINE 125 MCG TABL: 125 | 30 days supply | Qty: 30 | Fill #3

## 2020-07-09 MED FILL — METOPROLOL SUCCINATE ER 50: 50 | 30 days supply | Qty: 30 | Fill #2

## 2020-07-15 ENCOUNTER — Other Ambulatory Visit: Payer: Self-pay | Admitting: Family Medicine

## 2020-07-15 DIAGNOSIS — E1169 Type 2 diabetes mellitus with other specified complication: Secondary | ICD-10-CM

## 2020-07-15 MED ORDER — TRULICITY 1.5 MG/0.5ML ~~LOC~~ SOAJ: 1.5000 mg | SUBCUTANEOUS | 0 refills | Status: DC

## 2020-07-15 NOTE — Telephone Encounter (Signed)
Pt request refill  Dulaglutide (TRULICITY) 1.5 MG/0.5ML [facility] [place], [place] - [place] [address] [employer] Phone:  [phone]  Fax:  [phone]

## 2020-07-23 MED FILL — GABAPENTIN 100 MG CAPSULE: 100 | 20 days supply | Qty: 60 | Fill #4

## 2020-07-25 MED FILL — TRULICITY 1.5 MG/0.5 ML PEN: 1.5 | 28 days supply | Qty: 2 | Fill #0

## 2020-08-11 ENCOUNTER — Other Ambulatory Visit: Payer: Self-pay | Admitting: Nurse Practitioner

## 2020-08-11 ENCOUNTER — Other Ambulatory Visit: Payer: Self-pay | Admitting: Family Medicine

## 2020-08-11 DIAGNOSIS — E039 Hypothyroidism, unspecified: Secondary | ICD-10-CM

## 2020-08-11 DIAGNOSIS — I10 Essential (primary) hypertension: Secondary | ICD-10-CM

## 2020-08-11 MED FILL — LISINOPRIL-HCTZ 10-12.5 MG: 10-12.5 | 30 days supply | Qty: 30 | Fill #9

## 2020-08-11 MED FILL — LEVOTHYROXINE 125 MCG TAB: 125 | 30 days supply | Qty: 30 | Fill #0

## 2020-08-11 MED FILL — METOPROLOL SUCCINATE ER 50: 50 | 30 days supply | Qty: 30 | Fill #3

## 2020-08-11 MED FILL — AMLODIPINE BESYLATE 10 MG T: 10 | 30 days supply | Qty: 30 | Fill #0

## 2020-09-05 ENCOUNTER — Telehealth: Payer: Self-pay

## 2020-09-05 NOTE — Telephone Encounter (Signed)
Called patient, no answer, unable to leave VM because voicemail full. Was calling to advise patient that her appointment for 09/19/20 had been cancelled due to the provider being out of the office. Patient may be scheduled with any provider first available.

## 2020-09-19 ENCOUNTER — Other Ambulatory Visit: Payer: Self-pay | Admitting: Nurse Practitioner

## 2020-09-19 ENCOUNTER — Ambulatory Visit: Payer: Self-pay | Admitting: Nurse Practitioner

## 2020-09-19 DIAGNOSIS — E1169 Type 2 diabetes mellitus with other specified complication: Secondary | ICD-10-CM

## 2020-09-19 DIAGNOSIS — I10 Essential (primary) hypertension: Secondary | ICD-10-CM

## 2020-09-19 DIAGNOSIS — E1142 Type 2 diabetes mellitus with diabetic polyneuropathy: Secondary | ICD-10-CM

## 2020-09-19 DIAGNOSIS — Z794 Long term (current) use of insulin: Secondary | ICD-10-CM

## 2020-09-19 DIAGNOSIS — E039 Hypothyroidism, unspecified: Secondary | ICD-10-CM

## 2020-09-19 MED ORDER — LEVOTHYROXINE SODIUM 125 MCG PO TABS: 125.0000 ug | ORAL_TABLET | Freq: Every day | ORAL | 3 refills | Status: DC

## 2020-09-19 MED ORDER — GABAPENTIN 100 MG PO CAPS: 100.0000 mg | ORAL_CAPSULE | Freq: Three times a day (TID) | ORAL | 4 refills | Status: DC

## 2020-09-19 MED ORDER — ATORVASTATIN CALCIUM 10 MG PO TABS: 10.0000 mg | ORAL_TABLET | Freq: Every day | ORAL | 1 refills | Status: DC

## 2020-09-19 MED ORDER — TRULICITY 1.5 MG/0.5ML ~~LOC~~ SOAJ: 1.5000 mg | SUBCUTANEOUS | 0 refills | Status: DC

## 2020-09-19 MED ORDER — METOPROLOL SUCCINATE ER 50 MG PO TB24: 50.0000 mg | ORAL_TABLET | Freq: Every day | ORAL | 0 refills | Status: DC

## 2020-09-19 MED ORDER — AMLODIPINE BESYLATE 10 MG PO TABS: 10.0000 mg | ORAL_TABLET | Freq: Every day | ORAL | 0 refills | Status: DC

## 2020-09-19 NOTE — Telephone Encounter (Signed)
Medication Refill - Medication: lisinopril-hydrochlorothiazide (ZESTORETIC) 10-12.5 MG tablet Dulaglutide (TRULICITY) 1.5 WN/4.6EV SOPN  gabapentin (NEURONTIN) 100 MG capsule  atorvastatin (LIPITOR) 10 MG tablet levothyroxine (SYNTHROID) 125 MCG tablet amLODipine (NORVASC) 10 MG tablet metoprolol succinate (TOPROL-XL) 50 MG 24 hr tablet  Pt is completely out of her current supply, she was scheduled for an appt today that was cancelled unbeknownst to her by the office. Pt is requesting refills for today. Please advise, is rescheduled for next available in the office.    Has the patient contacted their pharmacy? No. (Agent: If no, request that the patient contact the pharmacy for the refill.) (Agent: If yes, when and what did the pharmacy advise?)  Preferred Pharmacy (with phone number or street name):  [facility], [address]  [place] [address]  Phone: [phone] Fax: [phone]     Agent: Please be advised that RX refills may take up to 3 business days. We ask that you follow-up with your pharmacy.

## 2020-09-19 NOTE — Telephone Encounter (Signed)
Courtesy refills. Future appt in 1 month

## 2020-09-22 MED FILL — METOPROLOL SUCCINATE ER 50: 50 | 30 days supply | Qty: 30 | Fill #0

## 2020-09-22 MED FILL — ?TRULICITY 1.5 MG/0.5 ML PE: 1.5 | 28 days supply | Qty: 2 | Fill #0

## 2020-09-22 MED FILL — AMLODIPINE BESYLATE 10 MG T: 10 | 30 days supply | Qty: 30 | Fill #0

## 2020-09-22 MED FILL — LEVOTHYROXINE SODIUM 125 MC: 125 | 30 days supply | Qty: 30 | Fill #0

## 2020-09-22 MED FILL — GABAPENTIN 100 MG CAPSULE: 100 | 20 days supply | Qty: 60 | Fill #0

## 2020-09-22 MED FILL — ?ATORVASTATIN 10 MG TABLET: 10 | 30 days supply | Qty: 30 | Fill #0

## 2020-10-18 ENCOUNTER — Other Ambulatory Visit: Payer: Self-pay

## 2020-10-23 ENCOUNTER — Ambulatory Visit: Payer: Self-pay | Admitting: Physician Assistant

## 2020-10-23 ENCOUNTER — Telehealth (INDEPENDENT_AMBULATORY_CARE_PROVIDER_SITE_OTHER): Payer: Self-pay | Admitting: Nurse Practitioner

## 2020-10-23 ENCOUNTER — Other Ambulatory Visit: Payer: Self-pay

## 2020-10-23 DIAGNOSIS — E039 Hypothyroidism, unspecified: Secondary | ICD-10-CM

## 2020-10-23 DIAGNOSIS — E1142 Type 2 diabetes mellitus with diabetic polyneuropathy: Secondary | ICD-10-CM

## 2020-10-23 DIAGNOSIS — J00 Acute nasopharyngitis [common cold]: Secondary | ICD-10-CM

## 2020-10-23 DIAGNOSIS — E1169 Type 2 diabetes mellitus with other specified complication: Secondary | ICD-10-CM

## 2020-10-23 DIAGNOSIS — Z794 Long term (current) use of insulin: Secondary | ICD-10-CM

## 2020-10-23 DIAGNOSIS — I10 Essential (primary) hypertension: Secondary | ICD-10-CM

## 2020-10-23 MED ORDER — LISINOPRIL-HYDROCHLOROTHIAZIDE 10-12.5 MG PO TABS
1.0000 | ORAL_TABLET | Freq: Every day | ORAL | 0 refills | Status: DC
  Filled 2020-10-23: qty 30, 30d supply, fill #0
  Filled 2020-11-20 – 2020-11-25 (×2): qty 30, 30d supply, fill #1

## 2020-10-23 MED ORDER — DULAGLUTIDE 1.5 MG/0.5ML ~~LOC~~ SOAJ
SUBCUTANEOUS | 0 refills | Status: DC
  Filled 2020-10-23 (×2): qty 2, 28d supply, fill #0

## 2020-10-23 MED ORDER — FLUTICASONE PROPIONATE 50 MCG/ACT NA SUSP
2.0000 | Freq: Every day | NASAL | 0 refills | Status: DC
  Filled 2020-10-23: qty 16, 30d supply, fill #0

## 2020-10-23 MED ORDER — INSULIN DETEMIR 100 UNIT/ML ~~LOC~~ SOLN
35.0000 [IU] | Freq: Every day | SUBCUTANEOUS | 0 refills | Status: DC
  Filled 2020-10-23 (×2): qty 10, 28d supply, fill #0

## 2020-10-23 MED ORDER — LEVOTHYROXINE SODIUM 125 MCG PO TABS
ORAL_TABLET | ORAL | 0 refills | Status: DC
  Filled 2020-10-23: qty 30, 30d supply, fill #0

## 2020-10-23 MED ORDER — ATORVASTATIN CALCIUM 10 MG PO TABS
ORAL_TABLET | Freq: Every day | ORAL | 0 refills | Status: DC
  Filled 2020-10-23: qty 30, 30d supply, fill #0

## 2020-10-23 MED ORDER — AMLODIPINE BESYLATE 10 MG PO TABS
ORAL_TABLET | ORAL | 0 refills | Status: DC
  Filled 2020-10-23: qty 30, 30d supply, fill #0

## 2020-10-23 MED ORDER — METOPROLOL SUCCINATE ER 50 MG PO TB24
ORAL_TABLET | Freq: Every day | ORAL | 0 refills | Status: DC
  Filled 2020-10-23: qty 30, 30d supply, fill #0

## 2020-10-23 MED ORDER — GABAPENTIN 100 MG PO CAPS
ORAL_CAPSULE | Freq: Three times a day (TID) | ORAL | 0 refills | Status: DC
  Filled 2020-10-23: qty 60, 20d supply, fill #0

## 2020-10-23 NOTE — Patient Instructions (Addendum)
Type 2 diabetes mellitus with other specified complication, unspecified whether long term insulin use (HCC) -     Dulaglutide (TRULICITY) 1.5 XM/5.8KI SOPN; Inject 0.5 mLs (1.5 mg total) into the skin once a week.  -     Please follow up with PCP for complete physical and blood work in 1 month  Essential hypertension  Stable per patient  Continue current medicatsions  Hypothyroidism, post thyroidectomy -     follow with PCP for repeat blood work and physical     Follow up:  Follow up with PCP in 1 month for Physical

## 2020-10-23 NOTE — Progress Notes (Signed)
Virtual Visit via Telephone Note  I connected with Brenda [name] on 10/23/20 at 10:30 AM EDT by telephone and verified that I am speaking with the correct person using two identifiers.  Location: Patient: Home Provider: office   I discussed the limitations, risks, security and privacy concerns of performing an evaluation and management service by telephone and the availability of in person appointments. I also discussed with the patient that there may be a patient responsible charge related to this service. The patient expressed understanding and agreed to proceed.   History of Present Illness:   Patient presents today for medication refills.  She is being seen through televisit due to her PCP being out of the office today.  Patient states that she is due for medication refills for diabetes hypertension, hyperlipidemia, hypothyroidism.  Patient is due for a complete physical.  We discussed that I can refill medications until patient can get to her primary care physician for a complete physical with blood work.  She states that her blood pressures have been stable.  She states that her blood sugars have been fluctuating.  We discussed that she will need repeat blood work including hemoglobin A1c at her next appointment.  Patient is also having diabetic eye concerns.  She is scheduled to see ophthalmologist today. Denies f/c/s, n/v/d, hemoptysis, PND, chest pain or edema.    Observations/Objective:  Vitals with BMI 10/23/2020 11/07/2019 06/13/2017  Height 5\' 8"  5\' 8"  -  Weight 237 lbs 237 lbs -  BMI 90.30 09.23 -  Systolic - 300 762  Diastolic - 91 92  Pulse - 69 75      Assessment and Plan:   Type 2 diabetes mellitus with other specified complication, unspecified whether long term insulin use (HCC) -     Dulaglutide (TRULICITY) 1.5 UQ/3.3HL SOPN; Inject 0.5 mLs (1.5 mg total) into the skin once a week.  -     Please follow up with PCP for complete physical and blood work in 1  month  Essential hypertension  Stable per patient  Continue current medicatsions  Hypothyroidism, post thyroidectomy -     follow with PCP for repeat blood work and physical     Follow up:  Follow up with PCP in 1 month for Physical        I discussed the assessment and treatment plan with the patient. The patient was provided an opportunity to ask questions and all were answered. The patient agreed with the plan and demonstrated an understanding of the instructions.   The patient was advised to call back or seek an in-person evaluation if the symptoms worsen or if the condition fails to improve as anticipated.  I provided 23 minutes of non-face-to-face time during this encounter.   Fenton Foy, NP

## 2020-10-24 ENCOUNTER — Other Ambulatory Visit: Payer: Self-pay

## 2020-11-20 ENCOUNTER — Other Ambulatory Visit: Payer: Self-pay | Admitting: Nurse Practitioner

## 2020-11-20 ENCOUNTER — Other Ambulatory Visit: Payer: Self-pay

## 2020-11-20 DIAGNOSIS — E039 Hypothyroidism, unspecified: Secondary | ICD-10-CM

## 2020-11-20 DIAGNOSIS — I10 Essential (primary) hypertension: Secondary | ICD-10-CM

## 2020-11-20 DIAGNOSIS — Z794 Long term (current) use of insulin: Secondary | ICD-10-CM

## 2020-11-20 DIAGNOSIS — E1169 Type 2 diabetes mellitus with other specified complication: Secondary | ICD-10-CM

## 2020-11-20 DIAGNOSIS — E1142 Type 2 diabetes mellitus with diabetic polyneuropathy: Secondary | ICD-10-CM

## 2020-11-21 ENCOUNTER — Other Ambulatory Visit: Payer: Self-pay

## 2020-11-21 ENCOUNTER — Telehealth: Payer: Self-pay

## 2020-11-21 NOTE — Telephone Encounter (Signed)
Attempted to reach pt 3xs. No answer, LVM stating Physical appt schedule needs to be cancelled.( Dr. Wynetta Emery is not Pt's PCP). Pt needs to schedule a NEW PT appt and fulfill that appt to then have physical. Another reason why pt needs a new pt appt is to have a provider refill and monitor medications pt needs. Per recent encounter w/ [facility], Pt needs to have A1C checked (last A1C= Sept.2021). Gave [facility] number as a suggestion to schedule a sooner VIRTUAL appt to establish care w/ Provider there.  Please advise and thank you.

## 2020-11-25 ENCOUNTER — Other Ambulatory Visit: Payer: Self-pay

## 2020-11-25 ENCOUNTER — Other Ambulatory Visit: Payer: Self-pay | Admitting: Internal Medicine

## 2020-11-25 ENCOUNTER — Other Ambulatory Visit: Payer: Self-pay | Admitting: Nurse Practitioner

## 2020-11-25 DIAGNOSIS — I10 Essential (primary) hypertension: Secondary | ICD-10-CM

## 2020-11-25 DIAGNOSIS — E1169 Type 2 diabetes mellitus with other specified complication: Secondary | ICD-10-CM

## 2020-11-25 DIAGNOSIS — Z794 Long term (current) use of insulin: Secondary | ICD-10-CM

## 2020-11-25 DIAGNOSIS — E1142 Type 2 diabetes mellitus with diabetic polyneuropathy: Secondary | ICD-10-CM

## 2020-11-25 DIAGNOSIS — E039 Hypothyroidism, unspecified: Secondary | ICD-10-CM

## 2020-11-25 NOTE — Telephone Encounter (Signed)
Requested medication (s) are due for refill today: Yes  Requested medication (s) are on the active medication list: Yes  Last refill:    Future visit scheduled: No  Notes to clinic:  Left message to make appointment.    Requested Prescriptions  Pending Prescriptions Disp Refills   atorvastatin (LIPITOR) 10 MG tablet 30 tablet 0    Sig: TAKE 1 TABLET (10 MG TOTAL) BY MOUTH DAILY.      There is no refill protocol information for this order      Dulaglutide (TRULICITY) 1.5 YQ/6.5HQ SOPN 2 mL 0    Sig: INJECT 1.5 MG INTO THE SKIN ONCE A WEEK. MUST HAVE OFFICE VISIT FOR REFILLS      There is no refill protocol information for this order      levothyroxine (SYNTHROID) 125 MCG tablet 30 tablet 0    Sig: TAKE 1 TABLET (125 MCG TOTAL) BY MOUTH DAILY BEFORE BREAKFAST.      There is no refill protocol information for this order      metoprolol succinate (TOPROL-XL) 50 MG 24 hr tablet 30 tablet 0    Sig: TAKE 1 TABLET (50 MG TOTAL) BY MOUTH DAILY.      There is no refill protocol information for this order

## 2020-11-26 ENCOUNTER — Other Ambulatory Visit: Payer: Self-pay

## 2020-11-28 ENCOUNTER — Other Ambulatory Visit: Payer: Self-pay

## 2020-12-03 ENCOUNTER — Ambulatory Visit (HOSPITAL_COMMUNITY)
Admission: EM | Admit: 2020-12-03 | Discharge: 2020-12-03 | Disposition: A | Discharge status: Home / Self Care | Payer: 59 | Attending: Physician Assistant | Admitting: Physician Assistant

## 2020-12-03 ENCOUNTER — Encounter (HOSPITAL_COMMUNITY): Payer: Self-pay

## 2020-12-03 DIAGNOSIS — E1142 Type 2 diabetes mellitus with diabetic polyneuropathy: Secondary | ICD-10-CM | POA: Insufficient documentation

## 2020-12-03 DIAGNOSIS — Z794 Long term (current) use of insulin: Secondary | ICD-10-CM

## 2020-12-03 DIAGNOSIS — Z760 Encounter for issue of repeat prescription: Secondary | ICD-10-CM | POA: Diagnosis present

## 2020-12-03 DIAGNOSIS — E039 Hypothyroidism, unspecified: Secondary | ICD-10-CM | POA: Diagnosis not present

## 2020-12-03 DIAGNOSIS — I10 Essential (primary) hypertension: Secondary | ICD-10-CM

## 2020-12-03 DIAGNOSIS — H1011 Acute atopic conjunctivitis, right eye: Secondary | ICD-10-CM | POA: Diagnosis present

## 2020-12-03 DIAGNOSIS — E1169 Type 2 diabetes mellitus with other specified complication: Secondary | ICD-10-CM

## 2020-12-03 DIAGNOSIS — J302 Other seasonal allergic rhinitis: Secondary | ICD-10-CM

## 2020-12-03 LAB — COMPREHENSIVE METABOLIC PANEL
ALT: 22 U/L (ref 0–44)
AST: 21 U/L (ref 15–41)
Albumin: 3.7 g/dL (ref 3.5–5.0)
Alkaline Phosphatase: 95 U/L (ref 38–126)
Anion gap: 9 (ref 5–15)
BUN: 24 mg/dL — ABNORMAL HIGH (ref 8–23)
CO2: 26 mmol/L (ref 22–32)
Calcium: 9 mg/dL (ref 8.9–10.3)
Chloride: 100 mmol/L (ref 98–111)
Creatinine, Ser: 1.01 mg/dL — ABNORMAL HIGH (ref 0.44–1.00)
GFR, Estimated: >60 mL/min (ref >60)
Glucose, Bld: 248 mg/dL — ABNORMAL HIGH (ref 70–99)
Potassium: 4 mmol/L (ref 3.5–5.1)
Sodium: 135 mmol/L (ref 135–145)
Total Bilirubin: 0.3 mg/dL (ref 0.3–1.2)
Total Protein: 6.9 g/dL (ref 6.5–8.1)

## 2020-12-03 LAB — CBC WITH DIFFERENTIAL/PLATELET
Abs Immature Granulocytes: 0.04 10*3/uL (ref 0.00–0.07)
Basophils Absolute: 0.1 10*3/uL (ref 0.0–0.1)
Basophils Relative: 1 %
Eosinophils Absolute: 0.1 10*3/uL (ref 0.0–0.5)
Eosinophils Relative: 1 %
HCT: 37.5 % (ref 36.0–46.0)
Hemoglobin: 11.9 g/dL — ABNORMAL LOW (ref 12.0–15.0)
Immature Granulocytes: 0 %
Lymphocytes Relative: 26 %
Lymphs Abs: 2.8 10*3/uL (ref 0.7–4.0)
MCH: 26.1 pg (ref 26.0–34.0)
MCHC: 31.7 g/dL (ref 30.0–36.0)
MCV: 82.2 fL (ref 80.0–100.0)
Monocytes Absolute: 0.8 10*3/uL (ref 0.1–1.0)
Monocytes Relative: 8 %
Neutro Abs: 7.1 10*3/uL (ref 1.7–7.7)
Neutrophils Relative %: 64 %
Platelets: 247 10*3/uL (ref 150–400)
RBC: 4.56 MIL/uL (ref 3.87–5.11)
RDW: 14 % (ref 11.5–15.5)
WBC: 10.9 10*3/uL — ABNORMAL HIGH (ref 4.0–10.5)
nRBC: 0 % (ref 0.0–0.2)

## 2020-12-03 MED ORDER — DULAGLUTIDE 1.5 MG/0.5ML ~~LOC~~ SOAJ
SUBCUTANEOUS | 1 refills | Status: DC
  Filled 2020-12-03 – 2020-12-04 (×2): qty 2, 28d supply, fill #0

## 2020-12-03 MED ORDER — AMLODIPINE BESYLATE 10 MG PO TABS
ORAL_TABLET | ORAL | 1 refills | Status: DC
  Filled 2020-12-03: qty 30, 30d supply, fill #0
  Filled 2020-12-31: qty 30, 30d supply, fill #1

## 2020-12-03 MED ORDER — METOPROLOL SUCCINATE ER 50 MG PO TB24
ORAL_TABLET | Freq: Every day | ORAL | 1 refills | Status: DC
  Filled 2020-12-03: qty 30, 30d supply, fill #0
  Filled 2020-12-31: qty 30, 30d supply, fill #1

## 2020-12-03 MED ORDER — LISINOPRIL-HYDROCHLOROTHIAZIDE 10-12.5 MG PO TABS
1.0000 | ORAL_TABLET | Freq: Every day | ORAL | 1 refills | Status: DC
  Filled 2020-12-03: qty 30, 30d supply, fill #0

## 2020-12-03 MED ORDER — OLOPATADINE HCL 0.1 % OP SOLN
OPHTHALMIC | 1 refills | Status: AC
  Filled 2020-12-03: qty 5, 37d supply, fill #0
  Filled 2021-01-28: qty 5, 37d supply, fill #1

## 2020-12-03 MED ORDER — LEVOTHYROXINE SODIUM 125 MCG PO TABS
ORAL_TABLET | ORAL | 1 refills | Status: DC
  Filled 2020-12-03: qty 30, 30d supply, fill #0
  Filled 2020-12-31: qty 30, 30d supply, fill #1

## 2020-12-03 MED ORDER — GABAPENTIN 100 MG PO CAPS
ORAL_CAPSULE | Freq: Three times a day (TID) | ORAL | 1 refills | Status: DC
  Filled 2020-12-03: qty 90, 30d supply, fill #0
  Filled 2020-12-31: qty 90, 30d supply, fill #1

## 2020-12-03 NOTE — ED Triage Notes (Addendum)
Pt in requesting refill for her synthroid, norvasc, metoprolol, lipitor, patadine eye drops, trulicity pen, zestoretic, and gabapentin

## 2020-12-03 NOTE — ED Provider Notes (Signed)
[facility]    CSN: [id number] Arrival date & time: 12/03/20  1702      History   Chief Complaint Chief Complaint  Patient presents with  . Medication Refill    HPI ARABIA NYLUND is a 60 y.o. female.   Patient presents today for medication refill.  Reports that her primary care provider office has gone through several provider changes and she has been unable to keep appointments due to scheduling conflicts by the office.  She is scheduled to see someone 12/31/2020 but is out of her medication at this time.  She does have a history of essential hypertension managed with amlodipine, metoprolol, lisinopril/hydrochlorothiazide.  She reports taking medication as prescribed without missing doses or noted side effects.  She has not been monitoring her blood pressure at home.  She does report some mild headaches but denies any chest pain, shortness of breath, peripheral edema, visual changes.  She is not exercising or monitoring her diet for salt.  In addition, patient has a history of diabetes.  She is requesting a refill of Trulicity.  She has plenty of Levemir available has been taking 35 units as prescribed.  She is monitoring her blood sugar at home and reports this has been fluctuating with highest recorded 239.  She does have a history of neuropathy managed with gabapentin and is requesting a refill of this medication.  She does try to eat healthy diet but is not physically active.  Denies any episodes of hypoglycemia.  Denies symptomatic polyuria, polydipsia, polyphagia, nausea, vomiting.  In addition, patient has a history of hypothyroidism related to thyroidectomy.  She has been stable on her current levothyroxine dose for many years.  She denies any unintentional weight loss, palpitations, anxiety, bowel changes.  She has been without her medication for approximately 3 to 4 weeks.     Past Medical History:  Diagnosis Date  . Arthritis   . Colon cancer ([place]) 09/08/09    Colonoscopy by Dr Collene Mares; Surgery by Dr Donne Hazel  . Diabetes mellitus   . Family history of breast cancer    aunt and cousin on father's side  . Family history of colon cancer    grandmother - father's side  . Family history of prostate cancer    uncle - (father's brother)  . Fatigue   . Headache(784.0)   . Hemorrhoid   . Hyperlipidemia   . Hypertension   . Night sweats   . Thyroid disease   . Wears glasses     Patient Active Problem List   Diagnosis Date Noted  . Colon cancer ([facility]) 05/01/2012  . 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 RUQ pain 05/01/2012    Past Surgical History:  Procedure Laterality Date  . ABDOMINAL HYSTERECTOMY    . APPENDECTOMY    . CYST REMOVED     BACK OF HEAD  . RIGHT COLECTOMY  09-09-09   FOR COLON CANCER  . THYROID SURGERY  09/02/10   Clarify with patient; BENIGN PER PT  . TUBAL LIGATION      OB History   No obstetric history on file.      Home Medications    Prior to Admission medications   Medication Sig Start Date End Date Taking? Authorizing Provider  amLODipine (NORVASC) 10 MG tablet TAKE 1 TABLET (10 MG TOTAL) BY MOUTH DAILY. FOR BLOOD PRESSURE 12/03/20 12/03/21  Jarica Plass, Junie Panning K, PA-C  atorvastatin (LIPITOR) 10 MG tablet TAKE  1 TABLET (10 MG TOTAL) BY MOUTH DAILY. 10/23/20 10/23/21  Fenton Foy, NP  benzonatate (TESSALON) 100 MG capsule Take 1 capsule (100 mg total) by mouth 2 (two) times daily as needed for cough. 03/19/20   Gildardo Pounds, NP  Blood Glucose Monitoring Suppl (TRUE METRIX METER) w/Device KIT Use 3 times daily to check blood sugars 06/21/19   Fulp, Cammie, MD  CINNAMON PO Take 1,000 tablets by mouth daily.    [provider]  Dulaglutide 1.5 MG/0.5ML SOPN INJECT 1.5 MG INTO THE SKIN ONCE A WEEK. MUST HAVE OFFICE VISIT FOR REFILLS 12/03/20 12/03/21  Anthany [place]hill, Junie Panning K, PA-C  fluticasone (FLONASE) 50 MCG/ACT nasal spray Place 2 sprays into both  nostrils daily. 10/23/20   Fenton Foy, NP  gabapentin (NEURONTIN) 100 MG capsule TAKE 1 CAPSULE (100 MG TOTAL) BY MOUTH 3 (THREE) TIMES DAILY. 12/03/20 12/03/21  Bronsen Serano, Derry Skill, PA-C  glucose blood (TRUE METRIX BLOOD GLUCOSE TEST) test strip Use as instructed to check blood sugars 3 times daily 06/21/19   Fulp, Cammie, MD  insulin detemir (LEVEMIR) 100 UNIT/ML injection Inject 0.35 mLs (35 Units total) into the skin at bedtime. 10/23/20   Fenton Foy, NP  levothyroxine (SYNTHROID) 125 MCG tablet TAKE 1 TABLET (125 MCG TOTAL) BY MOUTH DAILY BEFORE BREAKFAST. 12/03/20 12/03/21  Noal Abshier, Derry Skill, PA-C  lisinopril-hydrochlorothiazide (ZESTORETIC) 10-12.5 MG tablet TAKE 1 TABLET BY MOUTH DAILY. 12/03/20 03/03/21  Arlynn Mcdermid, Derry Skill, PA-C  metoprolol succinate (TOPROL-XL) 50 MG 24 hr tablet TAKE 1 TABLET (50 MG TOTAL) BY MOUTH DAILY. 12/03/20 12/03/21  Leeana Creer, Derry Skill, PA-C  olopatadine (PATANOL) 0.1 % ophthalmic solution PLACE 1 DROP INTO THE RIGHT EYE 2 (TWO) TIMES DAILY. 12/03/20 12/03/21  Iann Rodier, Derry Skill, PA-C  TRUEplus Lancets 28G MISC Use to help check blood sugars 3 times daily 06/21/19   Fulp, Cammie, MD  TURMERIC PO Take 1 tablet by mouth daily.    [provider]    Family History Family History  Problem Relation Age of Onset  . Diabetes Father   . Heart disease Father   . Hypertension Father   . Kidney disease Father   . Diabetes Sister   . Diabetes Brother   . Colon cancer Paternal Grandmother 54    Social History Social History   Tobacco Use  . Smoking status: Former Smoker    Quit date: 04/05/1981    Years since quitting: 39.6  . Smokeless tobacco: Never Used  Substance Use Topics  . Alcohol use: No  . Drug use: No     Allergies   Patient has no known allergies.   Review of Systems Review of Systems  Constitutional: Negative for activity change, appetite change, fatigue, fever and unexpected weight change.  HENT: Negative for congestion, sinus pressure, sneezing and  sore throat.   Eyes: Negative for visual disturbance.  Respiratory: Negative for cough and shortness of breath.   Cardiovascular: Negative for chest pain.  Gastrointestinal: Negative for abdominal pain, diarrhea, nausea and vomiting.  Endocrine: Negative for cold intolerance and heat intolerance.  Musculoskeletal: Negative for arthralgias and myalgias.  Neurological: Positive for headaches. Negative for dizziness and light-headedness.     Physical Exam Triage Vital Signs ED Triage Vitals  Enc Vitals Group     BP 12/03/20 1739 (!) 178/95     Pulse Rate 12/03/20 1739 78     Resp 12/03/20 1739 17     Temp 12/03/20 1739 98.2 F (36.8 C)  Temp Source 12/03/20 1739 Oral     SpO2 12/03/20 1739 96 %     Weight --      Height --      Head Circumference --      Peak Flow --      Pain Score 12/03/20 1731 0     Pain Loc --      Pain Edu? --      Excl. in [place]? --    No data found.  Updated Vital Signs BP (!) 178/95   Pulse 78   Temp 98.2 F (36.8 C) (Oral)   Resp 17   SpO2 96%   Visual Acuity Right Eye Distance:   Left Eye Distance:   Bilateral Distance:    Right Eye Near:   Left Eye Near:    Bilateral Near:     Physical Exam Vitals reviewed.  Constitutional:      General: She is awake. She is not in acute distress.    Appearance: Normal appearance. She is not ill-appearing.     Comments: Very pleasant female appears stated age in no acute distress  HENT:     Head: Normocephalic and atraumatic.     Mouth/Throat:     Pharynx: Uvula midline. No oropharyngeal exudate or posterior oropharyngeal erythema.  Eyes:     Conjunctiva/sclera: Conjunctivae normal.  Cardiovascular:     Rate and Rhythm: Normal rate and regular rhythm.     Heart sounds: No murmur heard.   Pulmonary:     Effort: Pulmonary effort is normal.     Breath sounds: Normal breath sounds. No wheezing, rhonchi or rales.     Comments: Clear to auscultation bilaterally Abdominal:     General: Bowel  sounds are normal.     Palpations: Abdomen is soft.     Tenderness: There is no abdominal tenderness. There is no right CVA tenderness, left CVA tenderness, guarding or rebound.  Musculoskeletal:     Right lower leg: 1+ Edema present.     Left lower leg: 1+ Edema present.  Psychiatric:        Behavior: Behavior is cooperative.      UC Treatments / Results  Labs (all labs ordered are listed, but only abnormal results are displayed) Labs Reviewed  CBC WITH DIFFERENTIAL/PLATELET  COMPREHENSIVE METABOLIC PANEL    EKG   Radiology No results found.  Procedures Procedures (including critical care time)  Medications Ordered in UC Medications - No data to display  Initial Impression / Assessment and Plan / UC Course  I have reviewed the triage vital signs and the nursing notes.  Pertinent labs & imaging results that were available during my care of the patient were reviewed by me and considered in my medical decision making (see chart for details).     Patient's blood pressure was elevated today we discussed that she needs to restart medication.  She denies any current symptoms but has been having intermittent headaches.  Recommended that she monitor her blood pressure outside of office visits and keep log for evaluation of follow-up appointment with PCP.  If blood pressure remains above 140/90 after being on medication for 1 week she needs to return for medication adjustment.  Discussed that if she has any chest pain, shortness of breath, peripheral edema, headache, visual disturbance she needs to go to the ER if blood pressure is also elevated.  Basic labs obtained today.  60 days of refills provided as requested by patient.  Discussed that it is very  important that she monitors her blood sugar regularly as she may need medication adjustment at follow-up appointment PCP.  Will not obtain thyroid labs since patient was on without medication for 3 to 4 weeks but discussed this would  need to be reevaluated in 4 to 6 weeks with PCP to determine if dose is appropriate.  Strict return precautions given to which patient expressed understanding.  Final Clinical Impressions(s) / UC Diagnoses   Final diagnoses:  Essential hypertension  Type 2 diabetes mellitus with diabetic polyneuropathy, with long-term current use of insulin ([place])  Acquired hypothyroidism  Seasonal allergies  Encounter for medication refill   Discharge Instructions   None    ED Prescriptions    Medication Sig Dispense Auth. Provider   amLODipine (NORVASC) 10 MG tablet TAKE 1 TABLET (10 MG TOTAL) BY MOUTH DAILY. FOR BLOOD PRESSURE 30 tablet Deisi Salonga K, PA-C   Dulaglutide 1.5 MG/0.5ML SOPN INJECT 1.5 MG INTO THE SKIN ONCE A WEEK. MUST HAVE OFFICE VISIT FOR REFILLS 2 mL Jairon Ripberger K, PA-C   gabapentin (NEURONTIN) 100 MG capsule TAKE 1 CAPSULE (100 MG TOTAL) BY MOUTH 3 (THREE) TIMES DAILY. 90 capsule Imane Burrough K, PA-C   levothyroxine (SYNTHROID) 125 MCG tablet TAKE 1 TABLET (125 MCG TOTAL) BY MOUTH DAILY BEFORE BREAKFAST. 30 tablet Nakiya Rallis K, PA-C   lisinopril-hydrochlorothiazide (ZESTORETIC) 10-12.5 MG tablet TAKE 1 TABLET BY MOUTH DAILY. 30 tablet Vaishnavi Dalby K, PA-C   metoprolol succinate (TOPROL-XL) 50 MG 24 hr tablet TAKE 1 TABLET (50 MG TOTAL) BY MOUTH DAILY. 30 tablet Earleen Aoun K, PA-C   olopatadine (PATANOL) 0.1 % ophthalmic solution PLACE 1 DROP INTO THE RIGHT EYE 2 (TWO) TIMES DAILY. 5 mL Cydney Alvarenga K, PA-C     PDMP not reviewed this encounter.   Terrilee Croak, PA-C 12/03/20 1758

## 2020-12-03 NOTE — Discharge Instructions (Addendum)
I sent in your refills as requested.  Please monitor your blood pressure and if this remains above 140/90 return for reevaluation.  Keep track of your blood sugar as you may need to adjust her medication in the future.  Make sure you take your Synthroid on an empty stomach and wait at least 30 to 60 minutes before taking additional medications.  If you have any side effects or symptoms you need to be reevaluated.

## 2020-12-04 ENCOUNTER — Encounter: Payer: Self-pay | Admitting: Internal Medicine

## 2020-12-04 ENCOUNTER — Other Ambulatory Visit: Payer: Self-pay

## 2020-12-16 ENCOUNTER — Other Ambulatory Visit: Payer: Self-pay

## 2020-12-24 DIAGNOSIS — Z1231 Encounter for screening mammogram for malignant neoplasm of breast: Secondary | ICD-10-CM

## 2020-12-31 ENCOUNTER — Other Ambulatory Visit: Payer: Self-pay

## 2020-12-31 ENCOUNTER — Encounter: Payer: Self-pay | Admitting: Family Medicine

## 2020-12-31 ENCOUNTER — Ambulatory Visit: Payer: 59 | Attending: Family Medicine | Admitting: Family Medicine

## 2020-12-31 VITALS — BP 146/75 | HR 67 | Ht 68.0 in | Wt 244.2 lb

## 2020-12-31 DIAGNOSIS — I152 Hypertension secondary to endocrine disorders: Secondary | ICD-10-CM | POA: Diagnosis not present

## 2020-12-31 DIAGNOSIS — E1169 Type 2 diabetes mellitus with other specified complication: Secondary | ICD-10-CM

## 2020-12-31 DIAGNOSIS — E1159 Type 2 diabetes mellitus with other circulatory complications: Secondary | ICD-10-CM

## 2020-12-31 DIAGNOSIS — E1142 Type 2 diabetes mellitus with diabetic polyneuropathy: Secondary | ICD-10-CM | POA: Diagnosis not present

## 2020-12-31 DIAGNOSIS — Z1231 Encounter for screening mammogram for malignant neoplasm of breast: Secondary | ICD-10-CM

## 2020-12-31 DIAGNOSIS — Z794 Long term (current) use of insulin: Secondary | ICD-10-CM

## 2020-12-31 DIAGNOSIS — Z1211 Encounter for screening for malignant neoplasm of colon: Secondary | ICD-10-CM

## 2020-12-31 LAB — POCT GLYCOSYLATED HEMOGLOBIN (HGB A1C): HbA1c, POC (controlled diabetic range): 9 % — AB (ref 0.0–7.0)

## 2020-12-31 LAB — GLUCOSE, POCT (MANUAL RESULT ENTRY): POC Glucose: 184 mg/dl — AB (ref 70–99)

## 2020-12-31 MED ORDER — LISINOPRIL-HYDROCHLOROTHIAZIDE 20-25 MG PO TABS
1.0000 | ORAL_TABLET | Freq: Every day | ORAL | 6 refills | Status: DC
  Filled 2020-12-31: qty 30, 30d supply, fill #0
  Filled 2021-01-27: qty 30, 30d supply, fill #1
  Filled 2021-02-25: qty 30, 30d supply, fill #2
  Filled 2021-03-25: qty 30, 30d supply, fill #3
  Filled 2021-04-29: qty 30, 30d supply, fill #4
  Filled 2021-05-29: qty 30, 30d supply, fill #5
  Filled 2021-06-05 – 2021-06-26 (×3): qty 30, 30d supply, fill #6

## 2020-12-31 MED ORDER — TRULICITY 3 MG/0.5ML ~~LOC~~ SOAJ
3.0000 mg | SUBCUTANEOUS | 6 refills | Status: DC
  Filled 2020-12-31: qty 2, 28d supply, fill #0
  Filled 2021-01-26: qty 2, 28d supply, fill #1
  Filled 2021-02-25: qty 2, 28d supply, fill #2

## 2020-12-31 MED ORDER — ATORVASTATIN CALCIUM 10 MG PO TABS
ORAL_TABLET | Freq: Every day | ORAL | 6 refills | Status: DC
  Filled 2020-12-31: qty 30, 30d supply, fill #0
  Filled 2021-02-25: qty 30, 30d supply, fill #1
  Filled 2021-03-25: qty 30, 30d supply, fill #2

## 2020-12-31 MED ORDER — INSULIN DETEMIR 100 UNIT/ML FLEXPEN
35.0000 [IU] | PEN_INJECTOR | Freq: Every day | SUBCUTANEOUS | 3 refills | Status: DC
  Filled 2020-12-31: qty 15, 42d supply, fill #0
  Filled 2021-03-17 – 2021-03-25 (×2): qty 15, 42d supply, fill #1
  Filled 2021-05-21: qty 15, 42d supply, fill #2
  Filled 2021-07-08: qty 15, 42d supply, fill #3
  Filled 2021-09-02: qty 15, 42d supply, fill #0
  Filled 2021-09-02: qty 15, 42d supply, fill #4

## 2020-12-31 NOTE — Patient Instructions (Signed)

## 2020-12-31 NOTE — Progress Notes (Signed)
Subjective:  Patient ID: Brenda [name], female    DOB: Sep 11, 1960  Age: 60 y.o. MRN: [id number]  CC: Diabetes   HPI Brenda [name] is a 60 year old female with a history of Type 2 Diabetes Mellitus (A1c 9.0), Hypertension, Levothyroxine, Obesity here for an office visit.  Interval History: She would like to come off insulin and do pills instead as she is tired of injections. Her A1c is 9.0 and has trended up from 7.4 previously . Her fasting sugar was 156 this morning and she endorses compliance with her medications. She has been unable to tolerate Metformin in the past.Endorses compliance with Levothyroxine, her antihypertensive and Statin. She has no additional concerns today.  Past Medical History:  Diagnosis Date   Arthritis    Colon cancer ([place]) 09/08/09   Colonoscopy by Dr Collene Mares; Surgery by Dr Donne Hazel   Diabetes mellitus    Family history of breast cancer    aunt and cousin on father's side   Family history of colon cancer    grandmother - father's side   Family history of prostate cancer    uncle - (father's brother)   Fatigue    Headache(784.0)    Hemorrhoid    Hyperlipidemia    Hypertension    Night sweats    Thyroid disease    Wears glasses     Past Surgical History:  Procedure Laterality Date   ABDOMINAL HYSTERECTOMY     APPENDECTOMY     CYST REMOVED     BACK OF HEAD   RIGHT COLECTOMY  09-09-09   FOR COLON CANCER   THYROID SURGERY  09/02/10   Clarify with patient; BENIGN PER PT   TUBAL LIGATION      Family History  Problem Relation Age of Onset   Diabetes Father    Heart disease Father    Hypertension Father    Kidney disease Father    Diabetes Sister    Diabetes Brother    Colon cancer Paternal Grandmother 75    No Known Allergies  Outpatient Medications Prior to Visit  Medication Sig Dispense Refill   amLODipine (NORVASC) 10 MG tablet TAKE 1 TABLET (10 MG TOTAL) BY MOUTH DAILY. FOR BLOOD PRESSURE 30 tablet 1   atorvastatin (LIPITOR) 10 MG  tablet TAKE 1 TABLET (10 MG TOTAL) BY MOUTH DAILY. 30 tablet 0   Blood Glucose Monitoring Suppl (TRUE METRIX METER) w/Device KIT Use 3 times daily to check blood sugars 1 kit 0   CINNAMON PO Take 1,000 tablets by mouth daily.     Dulaglutide 1.5 MG/0.5ML SOPN INJECT 1.5 MG INTO THE SKIN ONCE A WEEK. MUST HAVE OFFICE VISIT FOR REFILLS 2 mL 1   fluticasone (FLONASE) 50 MCG/ACT nasal spray Place 2 sprays into both nostrils daily. 16 g 0   gabapentin (NEURONTIN) 100 MG capsule TAKE 1 CAPSULE (100 MG TOTAL) BY MOUTH 3 (THREE) TIMES DAILY. 90 capsule 1   glucose blood (TRUE METRIX BLOOD GLUCOSE TEST) test strip Use as instructed to check blood sugars 3 times daily 100 each 12   insulin detemir (LEVEMIR) 100 UNIT/ML injection Inject 0.35 mLs (35 Units total) into the skin at bedtime. 10 mL 0   levothyroxine (SYNTHROID) 125 MCG tablet TAKE 1 TABLET (125 MCG TOTAL) BY MOUTH DAILY BEFORE BREAKFAST. 30 tablet 1   lisinopril-hydrochlorothiazide (ZESTORETIC) 10-12.5 MG tablet TAKE 1 TABLET BY MOUTH DAILY. 30 tablet 1   metoprolol succinate (TOPROL-XL) 50 MG 24 hr tablet TAKE 1 TABLET (50  MG TOTAL) BY MOUTH DAILY. 30 tablet 1   olopatadine (PATANOL) 0.1 % ophthalmic solution PLACE 1 DROP INTO THE RIGHT EYE 2 (TWO) TIMES DAILY. 5 mL 1   TRUEplus Lancets 28G MISC Use to help check blood sugars 3 times daily 1100 each 11   TURMERIC PO Take 1 tablet by mouth daily.     benzonatate (TESSALON) 100 MG capsule Take 1 capsule (100 mg total) by mouth 2 (two) times daily as needed for cough. (Patient not taking: Reported on 12/31/2020) 30 capsule 0   No facility-administered medications prior to visit.     ROS Review of Systems  Constitutional:  Negative for activity change, appetite change and fatigue.  HENT:  Negative for congestion, sinus pressure and sore throat.   Eyes:  Negative for visual disturbance.  Respiratory:  Negative for cough, chest tightness, shortness of breath and wheezing.   Cardiovascular:   Negative for chest pain and palpitations.  Gastrointestinal:  Negative for abdominal distention, abdominal pain and constipation.  Endocrine: Negative for polydipsia.  Genitourinary:  Negative for dysuria and frequency.  Musculoskeletal:  Negative for arthralgias and back pain.  Skin:  Negative for rash.  Neurological:  Negative for tremors, light-headedness and numbness.  Hematological:  Does not bruise/bleed easily.  Psychiatric/Behavioral:  Negative for agitation and behavioral problems.    Objective:  BP (!) 146/75   Pulse 67   Ht 5' 8"  (1.727 m)   Wt 244 lb 3.2 oz (110.8 kg)   SpO2 100%   BMI 37.13 kg/m   BP/Weight 12/31/2020 03/05/2992 01/16/6966  Systolic BP 893 810 -  Diastolic BP 75 95 -  Wt. (Lbs) 244.2 - 237  BMI 37.13 - 36.04      Physical Exam Constitutional:      Appearance: She is well-developed.  Neck:     Vascular: No JVD.  Cardiovascular:     Rate and Rhythm: Normal rate.     Heart sounds: Normal heart sounds. No murmur heard. Pulmonary:     Effort: Pulmonary effort is normal.     Breath sounds: Normal breath sounds. No wheezing or rales.  Chest:     Chest wall: No tenderness.  Abdominal:     General: Bowel sounds are normal. There is no distension.     Palpations: Abdomen is soft. There is no mass.     Tenderness: There is no abdominal tenderness.  Musculoskeletal:        General: Normal range of motion.     Right lower leg: No edema.     Left lower leg: No edema.  Neurological:     Mental Status: She is alert and oriented to person, place, and time.  Psychiatric:        Mood and Affect: Mood normal.    CMP Latest Ref Rng & Units 12/03/2020 03/19/2020 11/07/2019  Glucose 70 - 99 mg/dL 248(H) 155(H) 130(H)  BUN 8 - 23 mg/dL 24(H) 22 18  Creatinine 0.44 - 1.00 mg/dL 1.01(H) 0.90 0.87  Sodium 135 - 145 mmol/L 135 141 139  Potassium 3.5 - 5.1 mmol/L 4.0 4.2 4.2  Chloride 98 - 111 mmol/L 100 102 101  CO2 22 - 32 mmol/L 26 25 21   Calcium 8.9 - 10.3  mg/dL 9.0 9.9 9.7  Total Protein 6.5 - 8.1 g/dL 6.9 - 7.1  Total Bilirubin 0.3 - 1.2 mg/dL 0.3 - 0.3  Alkaline Phos 38 - 126 U/L 95 - 104  AST 15 - 41 U/L 21 - 15  ALT 0 - 44 U/L 22 - 20    Lipid Panel     Component Value Date/Time   CHOL 162 11/07/2019 1110   TRIG 153 (H) 11/07/2019 1110   HDL 33 (L) 11/07/2019 1110   CHOLHDL 4.9 (H) 11/07/2019 1110   LDLCALC 102 (H) 11/07/2019 1110    CBC    Component Value Date/Time   WBC 10.9 (H) 12/03/2020 1751   RBC 4.56 12/03/2020 1751   HGB 11.9 (L) 12/03/2020 1751   HGB 11.9 10/28/2011 0936   HCT 37.5 12/03/2020 1751   HCT 35.9 10/28/2011 0936   PLT 247 12/03/2020 1751   PLT 202 10/28/2011 0936   MCV 82.2 12/03/2020 1751   MCV 82.3 10/28/2011 0936   MCH 26.1 12/03/2020 1751   MCHC 31.7 12/03/2020 1751   RDW 14.0 12/03/2020 1751   RDW 15.1 (H) 10/28/2011 0936   LYMPHSABS 2.8 12/03/2020 1751   LYMPHSABS 1.8 10/28/2011 0936   MONOABS 0.8 12/03/2020 1751   MONOABS 0.5 10/28/2011 0936   EOSABS 0.1 12/03/2020 1751   EOSABS 0.1 10/28/2011 0936   BASOSABS 0.1 12/03/2020 1751   BASOSABS 0.0 10/28/2011 0936    Lab Results  Component Value Date   HGBA1C 9.0 (A) 12/31/2020    Lab Results  Component Value Date   TSH 0.598 03/19/2020    Assessment & Plan:  1. Type 2 diabetes mellitus with other specified complication, with long-term current use of insulin (HCC) Uncontrolled with A1c of 9.0 which has trended up from 7.4 previously Dose of Trulicity increased Counseled on Diabetic diet, my plate method, 808 minutes of moderate intensity exercise/week Blood sugar logs with fasting goals of 80-120 mg/dl, random of less than 180 and in the event of sugars less than 60 mg/dl or greater than 400 mg/dl encouraged to notify the clinic. Advised on the need for annual eye exams, annual foot exams, Pneumonia vaccine. - POCT glucose (manual entry) - POCT glycosylated hemoglobin (Hb A1C)  2. Type 2 diabetes mellitus with diabetic  polyneuropathy, with long-term current use of insulin (HCC) Neuropathy is controlled on Gabapentin - atorvastatin (LIPITOR) 10 MG tablet; TAKE 1 TABLET (10 MG TOTAL) BY MOUTH DAILY.  Dispense: 30 tablet; Refill: 6 - insulin detemir (LEVEMIR) 100 UNIT/ML injection; Inject 0.35 mLs (35 Units total) into the skin at bedtime.  Dispense: 30 mL; Refill: 3  3. Screening for colon cancer - Ambulatory referral to Gastroenterology  4. Hypertension associated with diabetes ([place]) Lisinopril/HCTZ dose increased Continue amlodipine Counseled on blood pressure goal of less than 130/80, low-sodium, DASH diet, medication compliance, 150 minutes of moderate intensity exercise per week. Discussed medication compliance, adverse effects. - lisinopril-hydrochlorothiazide (ZESTORETIC) 20-25 MG tablet; Take 1 tablet by mouth daily.  Dispense: 30 tablet; Refill: 6    No orders of the defined types were placed in this encounter.   Return in about 1 month (around 01/30/2021) for PAP smear.       Charlott Rakes, MD, FAAFP. [facility] [place], [place]   12/31/2020, 11:23 AM

## 2021-01-01 ENCOUNTER — Other Ambulatory Visit: Payer: Self-pay

## 2021-01-08 DIAGNOSIS — Z1231 Encounter for screening mammogram for malignant neoplasm of breast: Secondary | ICD-10-CM

## 2021-01-14 ENCOUNTER — Encounter: Payer: Self-pay | Admitting: Internal Medicine

## 2021-01-26 ENCOUNTER — Other Ambulatory Visit: Payer: Self-pay

## 2021-01-27 ENCOUNTER — Other Ambulatory Visit: Payer: Self-pay

## 2021-01-28 ENCOUNTER — Other Ambulatory Visit: Payer: Self-pay | Admitting: Physician Assistant

## 2021-01-28 ENCOUNTER — Other Ambulatory Visit: Payer: Self-pay

## 2021-01-28 DIAGNOSIS — E039 Hypothyroidism, unspecified: Secondary | ICD-10-CM

## 2021-02-02 ENCOUNTER — Other Ambulatory Visit: Payer: Self-pay

## 2021-02-04 ENCOUNTER — Other Ambulatory Visit: Payer: Self-pay | Admitting: Physician Assistant

## 2021-02-04 DIAGNOSIS — E039 Hypothyroidism, unspecified: Secondary | ICD-10-CM

## 2021-02-04 DIAGNOSIS — I10 Essential (primary) hypertension: Secondary | ICD-10-CM

## 2021-02-09 ENCOUNTER — Other Ambulatory Visit: Payer: Self-pay

## 2021-02-11 ENCOUNTER — Encounter: Payer: Self-pay | Admitting: Family Medicine

## 2021-02-11 ENCOUNTER — Ambulatory Visit: Payer: 59 | Attending: Family Medicine | Admitting: Family Medicine

## 2021-02-11 ENCOUNTER — Other Ambulatory Visit: Payer: Self-pay

## 2021-02-11 VITALS — BP 167/83 | HR 64 | Ht 68.0 in | Wt 237.4 lb

## 2021-02-11 DIAGNOSIS — E1159 Type 2 diabetes mellitus with other circulatory complications: Secondary | ICD-10-CM | POA: Diagnosis not present

## 2021-02-11 DIAGNOSIS — N760 Acute vaginitis: Secondary | ICD-10-CM | POA: Diagnosis not present

## 2021-02-11 DIAGNOSIS — I152 Hypertension secondary to endocrine disorders: Secondary | ICD-10-CM

## 2021-02-11 DIAGNOSIS — Z1231 Encounter for screening mammogram for malignant neoplasm of breast: Secondary | ICD-10-CM

## 2021-02-11 DIAGNOSIS — E039 Hypothyroidism, unspecified: Secondary | ICD-10-CM

## 2021-02-11 DIAGNOSIS — Z794 Long term (current) use of insulin: Secondary | ICD-10-CM

## 2021-02-11 DIAGNOSIS — Z0000 Encounter for general adult medical examination without abnormal findings: Secondary | ICD-10-CM | POA: Diagnosis not present

## 2021-02-11 DIAGNOSIS — E1142 Type 2 diabetes mellitus with diabetic polyneuropathy: Secondary | ICD-10-CM | POA: Diagnosis not present

## 2021-02-11 DIAGNOSIS — B3731 Acute candidiasis of vulva and vagina: Secondary | ICD-10-CM

## 2021-02-11 DIAGNOSIS — B9689 Other specified bacterial agents as the cause of diseases classified elsewhere: Secondary | ICD-10-CM

## 2021-02-11 DIAGNOSIS — B373 Candidiasis of vulva and vagina: Secondary | ICD-10-CM

## 2021-02-11 DIAGNOSIS — Z111 Encounter for screening for respiratory tuberculosis: Secondary | ICD-10-CM

## 2021-02-11 MED ORDER — LEVOTHYROXINE SODIUM 125 MCG PO TABS
ORAL_TABLET | ORAL | 3 refills | Status: DC
  Filled 2021-02-11: qty 30, 30d supply, fill #0
  Filled 2021-03-12: qty 30, 30d supply, fill #1
  Filled 2021-04-08: qty 30, 30d supply, fill #2

## 2021-02-11 MED ORDER — METRONIDAZOLE 500 MG PO TABS
500.0000 mg | ORAL_TABLET | Freq: Two times a day (BID) | ORAL | 0 refills | Status: AC
  Filled 2021-02-11: qty 14, 7d supply, fill #0

## 2021-02-11 MED ORDER — METOPROLOL SUCCINATE ER 50 MG PO TB24
ORAL_TABLET | Freq: Every day | ORAL | 3 refills | Status: DC
  Filled 2021-02-11: qty 30, 30d supply, fill #0
  Filled 2021-03-12: qty 30, 30d supply, fill #1
  Filled 2021-04-08: qty 30, 30d supply, fill #2
  Filled 2021-05-11: qty 30, 30d supply, fill #3

## 2021-02-11 MED ORDER — AMLODIPINE BESYLATE 10 MG PO TABS
ORAL_TABLET | ORAL | 3 refills | Status: DC
  Filled 2021-02-11: qty 30, 30d supply, fill #0
  Filled 2021-03-12: qty 30, 30d supply, fill #1
  Filled 2021-04-08: qty 30, 30d supply, fill #2
  Filled 2021-05-11: qty 30, 30d supply, fill #3

## 2021-02-11 MED ORDER — GABAPENTIN 100 MG PO CAPS
ORAL_CAPSULE | Freq: Three times a day (TID) | ORAL | 3 refills | Status: DC
  Filled 2021-02-11: qty 90, 30d supply, fill #0
  Filled 2021-04-08: qty 90, 30d supply, fill #1
  Filled 2021-05-11: qty 90, 30d supply, fill #2
  Filled 2021-06-15 – 2021-07-08 (×5): qty 90, 30d supply, fill #3

## 2021-02-11 MED ORDER — FLUCONAZOLE 150 MG PO TABS
150.0000 mg | ORAL_TABLET | Freq: Once | ORAL | 0 refills | Status: AC
  Filled 2021-02-11: qty 1, 1d supply, fill #0

## 2021-02-11 NOTE — Patient Instructions (Signed)
Health Maintenance, Female Adopting a healthy lifestyle and getting preventive care are important in promoting health and wellness. Ask your health care provider about: The right schedule for you to have regular tests and exams. Things you can do on your own to prevent diseases and keep yourself healthy. What should I know about diet, weight, and exercise? Eat a healthy diet  Eat a diet that includes plenty of vegetables, fruits, low-fat dairy products, and lean protein. Do not eat a lot of foods that are high in solid fats, added sugars, or sodium.  Maintain a healthy weight Body mass index (BMI) is used to identify weight problems. It estimates body fat based on height and weight. Your health care provider can help determineyour BMI and help you achieve or maintain a healthy weight. Get regular exercise Get regular exercise. This is one of the most important things you can do for your health. Most adults should: Exercise for at least 150 minutes each week. The exercise should increase your heart rate and make you sweat (moderate-intensity exercise). Do strengthening exercises at least twice a week. This is in addition to the moderate-intensity exercise. Spend less time sitting. Even light physical activity can be beneficial. Watch cholesterol and blood lipids Have your blood tested for lipids and cholesterol at 62 years of age, then havethis test every 5 years. Have your cholesterol levels checked more often if: Your lipid or cholesterol levels are high. You are older than 62 years of age. You are at high risk for heart disease. What should I know about cancer screening? Depending on your health history and family history, you may need to have cancer screening at various ages. This may include screening for: Breast cancer. Cervical cancer. Colorectal cancer. Skin cancer. Lung cancer. What should I know about heart disease, diabetes, and high blood pressure? Blood pressure and heart  disease High blood pressure causes heart disease and increases the risk of stroke. This is more likely to develop in people who have high blood pressure readings, are of African descent, or are overweight. Have your blood pressure checked: Every 3-5 years if you are 18-39 years of age. Every year if you are 40 years old or older. Diabetes Have regular diabetes screenings. This checks your fasting blood sugar level. Have the screening done: Once every three years after age 40 if you are at a normal weight and have a low risk for diabetes. More often and at a younger age if you are overweight or have a high risk for diabetes. What should I know about preventing infection? Hepatitis B If you have a higher risk for hepatitis B, you should be screened for this virus. Talk with your health care provider to find out if you are at risk forhepatitis B infection. Hepatitis C Testing is recommended for: Everyone born from 1945 through 1965. Anyone with known risk factors for hepatitis C. Sexually transmitted infections (STIs) Get screened for STIs, including gonorrhea and chlamydia, if: You are sexually active and are younger than 62 years of age. You are older than 62 years of age and your health care provider tells you that you are at risk for this type of infection. Your sexual activity has changed since you were last screened, and you are at increased risk for chlamydia or gonorrhea. Ask your health care provider if you are at risk. Ask your health care provider about whether you are at high risk for HIV. Your health care provider may recommend a prescription medicine to help   prevent HIV infection. If you choose to take medicine to prevent HIV, you should first get tested for HIV. You should then be tested every 3 months for as long as you are taking the medicine. Pregnancy If you are about to stop having your period (premenopausal) and you may become pregnant, seek counseling before you get  pregnant. Take 400 to 800 micrograms (mcg) of folic acid every day if you become pregnant. Ask for birth control (contraception) if you want to prevent pregnancy. Osteoporosis and menopause Osteoporosis is a disease in which the bones lose minerals and strength with aging. This can result in bone fractures. If you are 65 years old or older, or if you are at risk for osteoporosis and fractures, ask your health care provider if you should: Be screened for bone loss. Take a calcium or vitamin D supplement to lower your risk of fractures. Be given hormone replacement therapy (HRT) to treat symptoms of menopause. Follow these instructions at home: Lifestyle Do not use any products that contain nicotine or tobacco, such as cigarettes, e-cigarettes, and chewing tobacco. If you need help quitting, ask your health care provider. Do not use street drugs. Do not share needles. Ask your health care provider for help if you need support or information about quitting drugs. Alcohol use Do not drink alcohol if: Your health care provider tells you not to drink. You are pregnant, may be pregnant, or are planning to become pregnant. If you drink alcohol: Limit how much you use to 0-1 drink a day. Limit intake if you are breastfeeding. Be aware of how much alcohol is in your drink. In the [place] one drink equals one 12 oz bottle of beer (355 mL), one 5 oz glass of wine (148 mL), or one 1 oz glass of hard liquor (44 mL). General instructions Schedule regular health, dental, and eye exams. Stay current with your vaccines. Tell your health care provider if: You often feel depressed. You have ever been abused or do not feel safe at home. Summary Adopting a healthy lifestyle and getting preventive care are important in promoting health and wellness. Follow your health care provider's instructions about healthy diet, exercising, and getting tested or screened for diseases. Follow your health care provider's  instructions on monitoring your cholesterol and blood pressure. This information is not intended to replace advice given to you by your health care provider. Make sure you discuss any questions you have with your healthcare provider. Document Revised: 06/28/2018 Document Reviewed: 06/28/2018 Elsevier Patient Education  2022 [employer]  

## 2021-02-11 NOTE — Progress Notes (Signed)
Subjective:  Patient ID: Brenda [name], female    DOB: 1960/08/26  Age: 60 y.o. MRN: [id number]  CC: Gynecologic Exam   HPI Brenda [name] is a 60 year old female with a history of Type 2 Diabetes Mellitus (A1c 9.0), Hypertension, hypothyroidism, obesity. She presents for complete physical exam today.  Interval History: Her Colonosocpy comes up in 04/2021.  Due for a mammogram but Pap smear not indicated due to hysterectomy status. She will need a physical form completed for the [employer] [employer] system and also needs a PPD placed.  States she is up-to-date on her shots from [place].  BP is elevated and she has been out of her antihypertensives and took only LIisnopril/HCTZ.  Also requesting refills of her levothyroxine which she takes for hypothyroidism and also for gabapentin use in treating her diabetic neuropathy. Past Medical History:  Diagnosis Date   Arthritis    Colon cancer ([place]) 09/08/09   Colonoscopy by Dr Collene Mares; Surgery by Dr Donne Hazel   Diabetes mellitus    Family history of breast cancer    aunt and cousin on father's side   Family history of colon cancer    grandmother - father's side   Family history of prostate cancer    uncle - (father's brother)   Fatigue    Headache(784.0)    Hemorrhoid    Hyperlipidemia    Hypertension    Night sweats    Thyroid disease    Wears glasses     Past Surgical History:  Procedure Laterality Date   ABDOMINAL HYSTERECTOMY     APPENDECTOMY     CYST REMOVED     BACK OF HEAD   RIGHT COLECTOMY  09-09-09   FOR COLON CANCER   THYROID SURGERY  09/02/10   Clarify with patient; BENIGN PER PT   TUBAL LIGATION      Family History  Problem Relation Age of Onset   Diabetes Father    Heart disease Father    Hypertension Father    Kidney disease Father    Diabetes Sister    Diabetes Brother    Colon cancer Paternal Grandmother 90    No Known Allergies  Outpatient Medications Prior to Visit  Medication Sig Dispense  Refill   atorvastatin (LIPITOR) 10 MG tablet TAKE 1 TABLET (10 MG TOTAL) BY MOUTH DAILY. 30 tablet 6   benzonatate (TESSALON) 100 MG capsule Take 1 capsule (100 mg total) by mouth 2 (two) times daily as needed for cough. (Patient not taking: Reported on 12/31/2020) 30 capsule 0   Blood Glucose Monitoring Suppl (TRUE METRIX METER) w/Device KIT Use 3 times daily to check blood sugars 1 kit 0   CINNAMON PO Take 1,000 tablets by mouth daily.     Dulaglutide (TRULICITY) 3 YT/0.1SW SOPN Inject 3 mg as directed once a week. 2 mL 6   fluticasone (FLONASE) 50 MCG/ACT nasal spray Place 2 sprays into both nostrils daily. 16 g 0   glucose blood (TRUE METRIX BLOOD GLUCOSE TEST) test strip Use as instructed to check blood sugars 3 times daily 100 each 12   insulin detemir (LEVEMIR) 100 UNIT/ML FlexPen Inject 35 Units into the skin at bedtime. 30 mL 3   lisinopril-hydrochlorothiazide (ZESTORETIC) 20-25 MG tablet Take 1 tablet by mouth daily. 30 tablet 6   olopatadine (PATANOL) 0.1 % ophthalmic solution PLACE 1 DROP INTO THE RIGHT EYE 2 (TWO) TIMES DAILY. 5 mL 1   TRUEplus Lancets 28G MISC Use to help check blood  sugars 3 times daily 1100 each 11   TURMERIC PO Take 1 tablet by mouth daily.     amLODipine (NORVASC) 10 MG tablet TAKE 1 TABLET (10 MG TOTAL) BY MOUTH DAILY. FOR BLOOD PRESSURE 30 tablet 1   gabapentin (NEURONTIN) 100 MG capsule TAKE 1 CAPSULE (100 MG TOTAL) BY MOUTH 3 (THREE) TIMES DAILY. 90 capsule 1   levothyroxine (SYNTHROID) 125 MCG tablet TAKE 1 TABLET (125 MCG TOTAL) BY MOUTH DAILY BEFORE BREAKFAST. 30 tablet 1   metoprolol succinate (TOPROL-XL) 50 MG 24 hr tablet TAKE 1 TABLET (50 MG TOTAL) BY MOUTH DAILY. 30 tablet 1   No facility-administered medications prior to visit.     ROS Review of Systems  Constitutional:  Negative for activity change, appetite change and fatigue.  HENT:  Negative for congestion, sinus pressure and sore throat.   Eyes:  Negative for visual disturbance.   Respiratory:  Negative for cough, chest tightness, shortness of breath and wheezing.   Cardiovascular:  Negative for chest pain and palpitations.  Gastrointestinal:  Negative for abdominal distention, abdominal pain and constipation.  Endocrine: Negative for polydipsia.  Genitourinary:  Negative for dysuria and frequency.  Musculoskeletal:  Negative for arthralgias and back pain.  Skin:  Negative for rash.  Neurological:  Negative for tremors, light-headedness and numbness.  Hematological:  Does not bruise/bleed easily.  Psychiatric/Behavioral:  Negative for agitation and behavioral problems.    Objective:  BP (!) 167/83   Pulse 64   Ht 5' 8"  (1.727 m)   Wt 237 lb 6.4 oz (107.7 kg)   SpO2 100%   BMI 36.10 kg/m   BP/Weight 02/11/2021 12/31/2020 04/04/9149  Systolic BP 569 794 801  Diastolic BP 83 75 95  Wt. (Lbs) 237.4 244.2 -  BMI 36.1 37.13 -      Physical Exam Exam conducted with a chaperone present.  Constitutional:      General: She is not in acute distress.    Appearance: She is well-developed. She is not diaphoretic.  HENT:     Head: Normocephalic.     Right Ear: External ear normal.     Left Ear: External ear normal.     Nose: Nose normal.  Eyes:     Conjunctiva/sclera: Conjunctivae normal.     Pupils: Pupils are equal, round, and reactive to light.  Neck:     Vascular: No JVD.  Cardiovascular:     Rate and Rhythm: Normal rate and regular rhythm.     Heart sounds: Normal heart sounds. No murmur heard.   No gallop.  Pulmonary:     Effort: Pulmonary effort is normal. No respiratory distress.     Breath sounds: Normal breath sounds. No wheezing or rales.  Chest:     Chest wall: No tenderness.  Breasts:    Right: Normal. No mass, nipple discharge, tenderness, axillary adenopathy or supraclavicular adenopathy.     Left: Normal. No mass, nipple discharge, tenderness, axillary adenopathy or supraclavicular adenopathy.  Abdominal:     General: Bowel sounds are  normal. There is no distension.     Palpations: Abdomen is soft. There is no mass.     Tenderness: There is no abdominal tenderness.     Hernia: There is no hernia in the left inguinal area or right inguinal area.  Genitourinary:    Pubic Area: No rash.      Labia:        Right: No rash.        Left: No rash.  Vagina: Vaginal discharge (whitish cheesy; fishy odor) present.     Adnexa: Right adnexa normal and left adnexa normal.       Right: No tenderness.         Left: No tenderness.       Comments: Absent cervix Musculoskeletal:        General: No tenderness. Normal range of motion.     Cervical back: Normal range of motion. No tenderness.  Lymphadenopathy:     Upper Body:     Right upper body: No supraclavicular or axillary adenopathy.     Left upper body: No supraclavicular or axillary adenopathy.  Skin:    General: Skin is warm and dry.  Neurological:     Mental Status: She is alert and oriented to person, place, and time.     Deep Tendon Reflexes: Reflexes are normal and symmetric.    CMP Latest Ref Rng & Units 12/03/2020 03/19/2020 11/07/2019  Glucose 70 - 99 mg/dL 248(H) 155(H) 130(H)  BUN 8 - 23 mg/dL 24(H) 22 18  Creatinine 0.44 - 1.00 mg/dL 1.01(H) 0.90 0.87  Sodium 135 - 145 mmol/L 135 141 139  Potassium 3.5 - 5.1 mmol/L 4.0 4.2 4.2  Chloride 98 - 111 mmol/L 100 102 101  CO2 22 - 32 mmol/L 26 25 21   Calcium 8.9 - 10.3 mg/dL 9.0 9.9 9.7  Total Protein 6.5 - 8.1 g/dL 6.9 - 7.1  Total Bilirubin 0.3 - 1.2 mg/dL 0.3 - 0.3  Alkaline Phos 38 - 126 U/L 95 - 104  AST 15 - 41 U/L 21 - 15  ALT 0 - 44 U/L 22 - 20    Lipid Panel     Component Value Date/Time   CHOL 162 11/07/2019 1110   TRIG 153 (H) 11/07/2019 1110   HDL 33 (L) 11/07/2019 1110   CHOLHDL 4.9 (H) 11/07/2019 1110   LDLCALC 102 (H) 11/07/2019 1110    CBC    Component Value Date/Time   WBC 10.9 (H) 12/03/2020 1751   RBC 4.56 12/03/2020 1751   HGB 11.9 (L) 12/03/2020 1751   HGB 11.9 10/28/2011 0936    HCT 37.5 12/03/2020 1751   HCT 35.9 10/28/2011 0936   PLT 247 12/03/2020 1751   PLT 202 10/28/2011 0936   MCV 82.2 12/03/2020 1751   MCV 82.3 10/28/2011 0936   MCH 26.1 12/03/2020 1751   MCHC 31.7 12/03/2020 1751   RDW 14.0 12/03/2020 1751   RDW 15.1 (H) 10/28/2011 0936   LYMPHSABS 2.8 12/03/2020 1751   LYMPHSABS 1.8 10/28/2011 0936   MONOABS 0.8 12/03/2020 1751   MONOABS 0.5 10/28/2011 0936   EOSABS 0.1 12/03/2020 1751   EOSABS 0.1 10/28/2011 0936   BASOSABS 0.1 12/03/2020 1751   BASOSABS 0.0 10/28/2011 0936    Lab Results  Component Value Date   HGBA1C 9.0 (A) 12/31/2020    Assessment & Plan:  1. Annual physical exam Counseled on 150 minutes of exercise per week, healthy eating (including decreased daily intake of saturated fats, cholesterol, added sugars, sodium),routine healthcare maintenance. - TB Skin Test  2. Encounter for screening mammogram for malignant neoplasm of breast - MM 3D SCREEN BREAST BILATERAL; Future  3. Hypothyroidism, post thyroidectomy stable - levothyroxine (SYNTHROID) 125 MCG tablet; TAKE 1 TABLET (125 MCG TOTAL) BY MOUTH DAILY BEFORE BREAKFAST.  Dispense: 30 tablet; Refill: 3  4. Type 2 diabetes mellitus with diabetic polyneuropathy, with long-term current use of insulin (HCC) Diabetes is uncontrolled but neuropathy is controlled on gabapentin - gabapentin (  NEURONTIN) 100 MG capsule; TAKE 1 CAPSULE (100 MG TOTAL) BY MOUTH 3 (THREE) TIMES DAILY.  Dispense: 90 capsule; Refill: 3  5. Bacterial vaginosis - metroNIDAZOLE (FLAGYL) 500 MG tablet; Take 1 tablet (500 mg total) by mouth 2 (two) times daily for 7 days.  Dispense: 14 tablet; Refill: 0  6. Vaginal candidiasis - fluconazole (DIFLUCAN) 150 MG tablet; Take 1 tablet (150 mg total) by mouth once for 1 dose.  Dispense: 1 tablet; Refill: 0  7. Hypertension associated with diabetes ([place]) Uncontrolled due to running out of medications which I have refilled Counseled on blood pressure goal of  less than 130/80, low-sodium, DASH diet, medication compliance, 150 minutes of moderate intensity exercise per week. Discussed medication compliance, adverse effects. - amLODipine (NORVASC) 10 MG tablet; TAKE 1 TABLET (10 MG TOTAL) BY MOUTH DAILY. FOR BLOOD PRESSURE  Dispense: 30 tablet; Refill: 3 - metoprolol succinate (TOPROL-XL) 50 MG 24 hr tablet; TAKE 1 TABLET (50 MG TOTAL) BY MOUTH DAILY.  Dispense: 30 tablet; Refill: 3    Meds ordered this encounter  Medications   amLODipine (NORVASC) 10 MG tablet    Sig: TAKE 1 TABLET (10 MG TOTAL) BY MOUTH DAILY. FOR BLOOD PRESSURE    Dispense:  30 tablet    Refill:  3   gabapentin (NEURONTIN) 100 MG capsule    Sig: TAKE 1 CAPSULE (100 MG TOTAL) BY MOUTH 3 (THREE) TIMES DAILY.    Dispense:  90 capsule    Refill:  3   levothyroxine (SYNTHROID) 125 MCG tablet    Sig: TAKE 1 TABLET (125 MCG TOTAL) BY MOUTH DAILY BEFORE BREAKFAST.    Dispense:  30 tablet    Refill:  3   metoprolol succinate (TOPROL-XL) 50 MG 24 hr tablet    Sig: TAKE 1 TABLET (50 MG TOTAL) BY MOUTH DAILY.    Dispense:  30 tablet    Refill:  3   fluconazole (DIFLUCAN) 150 MG tablet    Sig: Take 1 tablet (150 mg total) by mouth once for 1 dose.    Dispense:  1 tablet    Refill:  0   metroNIDAZOLE (FLAGYL) 500 MG tablet    Sig: Take 1 tablet (500 mg total) by mouth 2 (two) times daily for 7 days.    Dispense:  14 tablet    Refill:  0    Follow-up: Return in about 2 months (around 04/14/2021) for Diabetic visit.       Charlott Rakes, MD, FAAFP. [facility] [place], [place]   02/11/2021, 10:38 AM

## 2021-02-25 ENCOUNTER — Other Ambulatory Visit: Payer: Self-pay

## 2021-02-26 ENCOUNTER — Other Ambulatory Visit: Payer: Self-pay

## 2021-03-12 ENCOUNTER — Other Ambulatory Visit: Payer: Self-pay

## 2021-03-13 ENCOUNTER — Other Ambulatory Visit: Payer: Self-pay

## 2021-03-18 ENCOUNTER — Other Ambulatory Visit: Payer: Self-pay

## 2021-03-25 ENCOUNTER — Other Ambulatory Visit: Payer: Self-pay

## 2021-03-26 ENCOUNTER — Other Ambulatory Visit: Payer: Self-pay

## 2021-03-26 ENCOUNTER — Ambulatory Visit (AMBULATORY_SURGERY_CENTER): Payer: 59 | Admitting: *Deleted

## 2021-03-26 VITALS — Ht 68.0 in | Wt 237.0 lb

## 2021-03-26 DIAGNOSIS — Z86010 Personal history of colonic polyps: Secondary | ICD-10-CM

## 2021-03-26 DIAGNOSIS — Z85038 Personal history of other malignant neoplasm of large intestine: Secondary | ICD-10-CM

## 2021-03-26 MED ORDER — NA SULFATE-K SULFATE-MG SULF 17.5-3.13-1.6 GM/177ML PO SOLN
1.0000 | ORAL | 0 refills | Status: DC
  Filled 2021-03-26: qty 354, 7d supply, fill #0
  Filled 2021-04-06: qty 354, 1d supply, fill #0

## 2021-03-26 NOTE — Progress Notes (Signed)
Patient's pre-visit was done today over the phone with the patient due to COVID-19 pandemic. Name,DOB and address verified. Insurance verified. Patient denies any allergies to Eggs and Soy. Patient denies any problems with anesthesia/sedation, "wake up during surgery". Patient denies taking diet pills or blood thinners. No home Oxygen. Packet of Prep instructions mailed to patient including a copy of a consent form-pt is aware. Patient understands to call us back with any questions or concerns. Patient is aware of our care-partner policy and 0000000 safety protocol.   EMMI education assigned to the patient for the procedure, sent to [place].   The patient is COVID-19 vaccinated.

## 2021-04-02 ENCOUNTER — Other Ambulatory Visit: Payer: Self-pay

## 2021-04-06 ENCOUNTER — Other Ambulatory Visit: Payer: Self-pay

## 2021-04-08 ENCOUNTER — Other Ambulatory Visit: Payer: Self-pay

## 2021-04-09 ENCOUNTER — Ambulatory Visit (AMBULATORY_SURGERY_CENTER): Payer: 59 | Admitting: Internal Medicine

## 2021-04-09 ENCOUNTER — Other Ambulatory Visit: Payer: Self-pay

## 2021-04-09 ENCOUNTER — Encounter: Payer: Self-pay | Admitting: Internal Medicine

## 2021-04-09 VITALS — BP 145/73 | HR 61 | Temp 97.3°F | Resp 14 | Ht 68.0 in | Wt 237.0 lb

## 2021-04-09 DIAGNOSIS — Z85038 Personal history of other malignant neoplasm of large intestine: Secondary | ICD-10-CM

## 2021-04-09 DIAGNOSIS — Z86010 Personal history of colonic polyps: Secondary | ICD-10-CM

## 2021-04-09 DIAGNOSIS — D125 Benign neoplasm of sigmoid colon: Secondary | ICD-10-CM

## 2021-04-09 DIAGNOSIS — D124 Benign neoplasm of descending colon: Secondary | ICD-10-CM

## 2021-04-09 DIAGNOSIS — K635 Polyp of colon: Secondary | ICD-10-CM

## 2021-04-09 DIAGNOSIS — D123 Benign neoplasm of transverse colon: Secondary | ICD-10-CM

## 2021-04-09 DIAGNOSIS — D122 Benign neoplasm of ascending colon: Secondary | ICD-10-CM

## 2021-04-09 MED ORDER — SODIUM CHLORIDE 0.9 % IV SOLN: 500.0000 mL | Freq: Once | INTRAVENOUS | Status: DC

## 2021-04-09 NOTE — Progress Notes (Signed)
VS completed by DT.  Pt's states no medical or surgical changes since previsit or office visit.  

## 2021-04-09 NOTE — Progress Notes (Signed)
PT taken to PACU. Monitors in place. VSS. Report given to RN. 

## 2021-04-09 NOTE — Patient Instructions (Signed)
Handout given:  polyps, diverticulosis Resume previous diet Continue current medications Await pathology results Repeat colonoscopy in 3 years  YOU HAD AN ENDOSCOPIC PROCEDURE TODAY AT [facility]:   Refer to the procedure report that was given to you for any specific questions about what was found during the examination.  If the procedure report does not answer your questions, please call your gastroenterologist to clarify.  If you requested that your care partner not be given the details of your procedure findings, then the procedure report has been included in a sealed envelope for you to review at your convenience later.  YOU SHOULD EXPECT: Some feelings of bloating in the abdomen. Passage of more gas than usual.  Walking can help get rid of the air that was put into your GI tract during the procedure and reduce the bloating. If you had a lower endoscopy (such as a colonoscopy or flexible sigmoidoscopy) you may notice spotting of blood in your stool or on the toilet paper. If you underwent a bowel prep for your procedure, you may not have a normal bowel movement for a few days.  Please Note:  You might notice some irritation and congestion in your nose or some drainage.  This is from the oxygen used during your procedure.  There is no need for concern and it should clear up in a day or so.  SYMPTOMS TO REPORT IMMEDIATELY: Following lower endoscopy (colonoscopy or flexible sigmoidoscopy):  Excessive amounts of blood in the stool  Significant tenderness or worsening of abdominal pains  Swelling of the abdomen that is new, acute  Fever of 100F or higher  For urgent or emergent issues, a gastroenterologist can be reached at any hour by calling [phone]. Do not use MyChart messaging for urgent concerns.   DIET:  We do recommend a small meal at first, but then you may proceed to your regular diet.  Drink plenty of fluids but you should avoid alcoholic beverages for 24  hours.  ACTIVITY:  You should plan to take it easy for the rest of today and you should NOT DRIVE or use heavy machinery until tomorrow (because of the sedation medicines used during the test).    FOLLOW UP: Our staff will call the number listed on your records 48-72 hours following your procedure to check on you and address any questions or concerns that you may have regarding the information given to you following your procedure. If we do not reach you, we will leave a message.  We will attempt to reach you two times.  During this call, we will ask if you have developed any symptoms of COVID 19. If you develop any symptoms (ie: fever, flu-like symptoms, shortness of breath, cough etc.) before then, please call [phone].  If you test positive for Covid 19 in the 2 weeks post procedure, please call and report this information to [place].    If any biopsies were taken you will be contacted by phone or by letter within the next 1-3 weeks.  Please call us at [phone] if you have not heard about the biopsies in 3 weeks.   SIGNATURES/CONFIDENTIALITY: You and/or your care partner have signed paperwork which will be entered into your electronic medical record.  These signatures attest to the fact that that the information above on your After Visit Summary has been reviewed and is understood.  Full responsibility of the confidentiality of this discharge information lies with you and/or your care-partner.

## 2021-04-09 NOTE — Progress Notes (Signed)
Called to room to assist during endoscopic procedure.  Patient ID and intended procedure confirmed with present staff. Received instructions for my participation in the procedure from the performing physician.  

## 2021-04-09 NOTE — Progress Notes (Signed)
GASTROENTEROLOGY PROCEDURE H&P NOTE   Primary Care Physician: Charlott Rakes, MD    Reason for Procedure:  Personal history of colon cancer and colon polyps  Plan:    Surveillance colonoscopy  Patient is appropriate for endoscopic procedure(s) in the ambulatory ([place]) setting.  The nature of the procedure, as well as the risks, benefits, and alternatives were carefully and thoroughly reviewed with the patient. Ample time for discussion and questions allowed. The patient understood, was satisfied, and agreed to proceed.     HPI: Brenda [name] is a 60 y.o. female who presents for surveillance colonoscopy.  Medical history as below.  Personal and family history of colon cancer.  Last colonoscopy 2012, colorectal cancer surgery 2011 with right hemicolectomy.  No specific complaints today including abdominal pain or chest pain or shortness of breath. Tolerated the prep.   Past Medical History:  Diagnosis Date   Arthritis    Colon cancer ([place]) 09/08/2009   Colonoscopy by Dr Collene Mares; Surgery by Dr Donne Hazel   Diabetes mellitus    Family history of breast cancer    aunt and cousin on father's side   Family history of colon cancer    grandmother - father's side   Family history of prostate cancer    uncle - (father's brother)   Fatigue    Headache(784.0)    Hemorrhoid    Hyperlipidemia    Hypertension    Neuropathy    feet   Night sweats    Thyroid disease    Wears glasses     Past Surgical History:  Procedure Laterality Date   ABDOMINAL HYSTERECTOMY     APPENDECTOMY     COLONOSCOPY  05/14/2011   Dr.Thomas Mabry   CYST REMOVED     BACK OF HEAD   RIGHT COLECTOMY  09/09/2009   FOR COLON CANCER   THYROID SURGERY  09/02/2010   Clarify with patient; BENIGN PER PT   TUBAL LIGATION      Prior to Admission medications   Medication Sig Start Date End Date Taking? Authorizing Provider  amLODipine (NORVASC) 10 MG tablet TAKE 1 TABLET (10 MG TOTAL) BY MOUTH DAILY. FOR BLOOD  PRESSURE 02/11/21 02/11/22 Yes Newlin, Charlane Ferretti, MD  atorvastatin (LIPITOR) 10 MG tablet TAKE 1 TABLET (10 MG TOTAL) BY MOUTH DAILY. 12/31/20 12/31/21 Yes Charlott Rakes, MD  Blood Glucose Monitoring Suppl (TRUE METRIX METER) w/Device KIT Use 3 times daily to check blood sugars 06/21/19  Yes Fulp, Cammie, MD  Dulaglutide (TRULICITY) 3 VH/8.4ON SOPN Inject 3 mg as directed once a week. 12/31/20  Yes Newlin, Charlane Ferretti, MD  fluticasone (FLONASE) 50 MCG/ACT nasal spray Place 2 sprays into both nostrils daily. 10/23/20  Yes Fenton Foy, NP  gabapentin (NEURONTIN) 100 MG capsule TAKE 1 CAPSULE (100 MG TOTAL) BY MOUTH 3 (THREE) TIMES DAILY. 02/11/21 02/11/22 Yes Charlott Rakes, MD  glucose blood (TRUE METRIX BLOOD GLUCOSE TEST) test strip Use as instructed to check blood sugars 3 times daily 06/21/19  Yes Fulp, Cammie, MD  insulin detemir (LEVEMIR) 100 UNIT/ML FlexPen Inject 35 Units into the skin at bedtime. 12/31/20  Yes Charlott Rakes, MD  levothyroxine (SYNTHROID) 125 MCG tablet TAKE 1 TABLET (125 MCG TOTAL) BY MOUTH DAILY BEFORE BREAKFAST. 02/11/21 02/11/22 Yes Newlin, Enobong, MD  lisinopril-hydrochlorothiazide (ZESTORETIC) 20-25 MG tablet Take 1 tablet by mouth daily. 12/31/20  Yes Charlott Rakes, MD  metoprolol succinate (TOPROL-XL) 50 MG 24 hr tablet TAKE 1 TABLET (50 MG TOTAL) BY MOUTH DAILY. 02/11/21 02/11/22 Yes Charlott Rakes, MD  olopatadine (PATANOL) 0.1 % ophthalmic solution PLACE 1 DROP INTO THE RIGHT EYE 2 (TWO) TIMES DAILY. 12/03/20 12/03/21 Yes Raspet, Derry Skill, PA-C  TRUEplus Lancets 28G MISC Use to help check blood sugars 3 times daily 06/21/19  Yes Fulp, Cammie, MD    Current Outpatient Medications  Medication Sig Dispense Refill   amLODipine (NORVASC) 10 MG tablet TAKE 1 TABLET (10 MG TOTAL) BY MOUTH DAILY. FOR BLOOD PRESSURE 30 tablet 3   atorvastatin (LIPITOR) 10 MG tablet TAKE 1 TABLET (10 MG TOTAL) BY MOUTH DAILY. 30 tablet 6   Blood Glucose Monitoring Suppl (TRUE METRIX METER) w/Device KIT  Use 3 times daily to check blood sugars 1 kit 0   Dulaglutide (TRULICITY) 3 NL/8.9QJ SOPN Inject 3 mg as directed once a week. 2 mL 6   fluticasone (FLONASE) 50 MCG/ACT nasal spray Place 2 sprays into both nostrils daily. 16 g 0   gabapentin (NEURONTIN) 100 MG capsule TAKE 1 CAPSULE (100 MG TOTAL) BY MOUTH 3 (THREE) TIMES DAILY. 90 capsule 3   glucose blood (TRUE METRIX BLOOD GLUCOSE TEST) test strip Use as instructed to check blood sugars 3 times daily 100 each 12   insulin detemir (LEVEMIR) 100 UNIT/ML FlexPen Inject 35 Units into the skin at bedtime. 30 mL 3   levothyroxine (SYNTHROID) 125 MCG tablet TAKE 1 TABLET (125 MCG TOTAL) BY MOUTH DAILY BEFORE BREAKFAST. 30 tablet 3   lisinopril-hydrochlorothiazide (ZESTORETIC) 20-25 MG tablet Take 1 tablet by mouth daily. 30 tablet 6   metoprolol succinate (TOPROL-XL) 50 MG 24 hr tablet TAKE 1 TABLET (50 MG TOTAL) BY MOUTH DAILY. 30 tablet 3   olopatadine (PATANOL) 0.1 % ophthalmic solution PLACE 1 DROP INTO THE RIGHT EYE 2 (TWO) TIMES DAILY. 5 mL 1   TRUEplus Lancets 28G MISC Use to help check blood sugars 3 times daily 1100 each 11   Current Facility-Administered Medications  Medication Dose Route Frequency Provider Last Rate Last Admin   0.9 %  sodium chloride infusion  500 mL Intravenous Once Fidelis Loth, Lajuan Lines, MD        Allergies as of 04/09/2021   (No Known Allergies)    Family History  Problem Relation Age of Onset   Colon polyps Mother    Diabetes Father    Heart disease Father    Hypertension Father    Kidney disease Father    Diabetes Sister    Diabetes Brother    Colon cancer Paternal Grandmother 58   Esophageal cancer Neg Hx    Rectal cancer Neg Hx    Stomach cancer Neg Hx     Social History   Socioeconomic History   Marital status: Married    Spouse name: Not on file   Number of children: Not on file   Years of education: Not on file   Highest education level: Not on file  Occupational History   Not on file  Tobacco  Use   Smoking status: Former    Types: Cigarettes    Quit date: 04/05/1981    Years since quitting: 40.0   Smokeless tobacco: Never  Vaping Use   Vaping Use: Never used  Substance and Sexual Activity   Alcohol use: No   Drug use: No   Sexual activity: Not Currently  Other Topics Concern   Not on file  Social History Narrative   Not on file   Social Determinants of Health   Financial Resource Strain: Not on file  Food Insecurity: Not on file  Transportation  Needs: Not on file  Physical Activity: Not on file  Stress: Not on file  Social Connections: Not on file  Intimate Partner Violence: Not on file    Physical Exam: Vital signs in last 24 hours: @BP  (!) 146/75   Pulse 64   Temp (!) 97.3 F (36.3 C) (Temporal)   Ht 5' 8"  (1.727 m)   Wt 237 lb (107.5 kg)   SpO2 98%   BMI 36.04 kg/m  GEN: NAD EYE: Sclerae anicteric ENT: MMM CV: Non-tachycardic Pulm: CTA b/l GI: Soft, NT/ND NEURO:  Alert & Oriented x 3   Zenovia Jarred, MD [facility]  04/09/2021 10:43 AM

## 2021-04-09 NOTE — Op Note (Signed)
[place] Patient Name: Brenda [name] Procedure Date: 04/09/2021 10:45 AM MRN: [id number] Endoscopist: Jerene Bears , MD Age: 60 Referring MD:  Date of Birth: 11-20-1960 Gender: Female Account #: [id number] Procedure:                Colonoscopy Indications:              High risk colon cancer surveillance: Personal                            history of colon cancer with right sided colon                            cancer s/p right hemicolectomy in 2011, Last                            colonoscopy: 2012 (2 SSPs removed at last exam) Medicines:                Monitored Anesthesia Care Procedure:                Pre-Anesthesia Assessment:                           - Prior to the procedure, a History and Physical                            was performed, and patient medications and                            allergies were reviewed. The patient's tolerance of                            previous anesthesia was also reviewed. The risks                            and benefits of the procedure and the sedation                            options and risks were discussed with the patient.                            All questions were answered, and informed consent                            was obtained. Prior Anticoagulants: The patient has                            taken no previous anticoagulant or antiplatelet                            agents. ASA Grade Assessment: II - A patient with                            mild systemic disease. After reviewing the risks  and benefits, the patient was deemed in                            satisfactory condition to undergo the procedure.                           After obtaining informed consent, the colonoscope                            was passed under direct vision. Throughout the                            procedure, the patient's blood pressure, pulse, and                            oxygen saturations were  monitored continuously. The                            Olympus PCF-H190DL (#[id number]) Colonoscope was                            introduced through the anus and advanced to the                            ileocolonic anastomosis. The colonoscopy was                            performed without difficulty. The patient tolerated                            the procedure well. The quality of the bowel                            preparation was good. The ileocecal valve,                            appendiceal orifice, and rectum were photographed. Scope In: 10:55:35 AM Scope Out: 40:97:35 AM Scope Withdrawal Time: 0 hours 17 minutes 58 seconds  Total Procedure Duration: 0 hours 20 minutes 44 seconds  Findings:                 Hemorrhoids were found on perianal exam.                           A 2 mm polyp was found in the ascending colon. The                            polyp was sessile. The polyp was removed with a                            cold biopsy forceps. Resection and retrieval were                            complete.  A 18 mm polyp was found in the transverse colon.                            The polyp was sessile. The polyp was removed with a                            cold snare. Resection and retrieval were complete.                           Two sessile polyps were found in the descending                            colon. The polyps were 5 to 10 mm in size. These                            polyps were removed with a cold snare. Resection                            and retrieval were complete.                           A 4 mm polyp was found in the sigmoid colon. The                            polyp was sessile. The polyp was removed with a                            cold snare. Resection and retrieval were complete.                           External and internal hemorrhoids were found during                            retroflexion and during digital exam.  The                            hemorrhoids were medium-sized. Complications:            No immediate complications. Estimated Blood Loss:     Estimated blood loss was minimal. Impression:               - One 2 mm polyp in the ascending colon, removed                            with a cold biopsy forceps. Resected and retrieved.                           - One 18 mm polyp in the transverse colon, removed                            with a cold snare. Resected and retrieved.                           -  Two 5 to 10 mm polyps in the descending colon,                            removed with a cold snare. Resected and retrieved.                           - One 4 mm polyp in the sigmoid colon, removed with                            a cold snare. Resected and retrieved.                           - External (predominant) and small internal                            hemorrhoids. Recommendation:           - Patient has a contact number available for                            emergencies. The signs and symptoms of potential                            delayed complications were discussed with the                            patient. Return to normal activities tomorrow.                            Written discharge instructions were provided to the                            patient.                           - Resume previous diet.                           - Continue present medications.                           - Await pathology results.                           - Repeat colonoscopy in 3 years for surveillance. Jerene Bears, MD 04/09/2021 11:20:28 AM This report has been signed electronically.

## 2021-04-10 ENCOUNTER — Telehealth: Payer: Self-pay

## 2021-04-10 NOTE — Telephone Encounter (Signed)
Colonoscopy recall placed in Epic for 3 years

## 2021-04-13 ENCOUNTER — Telehealth: Payer: Self-pay | Admitting: *Deleted

## 2021-04-13 ENCOUNTER — Telehealth: Payer: Self-pay

## 2021-04-13 NOTE — Telephone Encounter (Signed)
Attempted to call patient for their post-procedure follow-up call. No answer. Left voicemail.   

## 2021-04-13 NOTE — Telephone Encounter (Signed)
  Follow up Call-  Call back number 04/09/2021  Post procedure Call Back phone  # [phone]  Permission to leave phone message Yes  Some recent data might be hidden     Patient questions:  Do you have a fever, pain , or abdominal swelling? No. Pain Score  0 *  Have you tolerated food without any problems? Yes.    Have you been able to return to your normal activities? Yes.    Do you have any questions about your discharge instructions: Diet   No. Medications  No. Follow up visit  No.  Do you have questions or concerns about your Care? No.  Actions: * If pain score is 4 or above: No action needed, pain <4.

## 2021-04-14 ENCOUNTER — Encounter: Payer: Self-pay | Admitting: Internal Medicine

## 2021-04-15 ENCOUNTER — Other Ambulatory Visit: Payer: Self-pay

## 2021-04-15 ENCOUNTER — Encounter: Payer: Self-pay | Admitting: Family Medicine

## 2021-04-15 ENCOUNTER — Ambulatory Visit: Payer: 59 | Attending: Family Medicine | Admitting: Family Medicine

## 2021-04-15 VITALS — BP 146/83 | HR 62 | Ht 68.0 in | Wt 232.0 lb

## 2021-04-15 DIAGNOSIS — I152 Hypertension secondary to endocrine disorders: Secondary | ICD-10-CM

## 2021-04-15 DIAGNOSIS — Z794 Long term (current) use of insulin: Secondary | ICD-10-CM

## 2021-04-15 DIAGNOSIS — E039 Hypothyroidism, unspecified: Secondary | ICD-10-CM | POA: Diagnosis not present

## 2021-04-15 DIAGNOSIS — E1142 Type 2 diabetes mellitus with diabetic polyneuropathy: Secondary | ICD-10-CM | POA: Diagnosis not present

## 2021-04-15 DIAGNOSIS — E1159 Type 2 diabetes mellitus with other circulatory complications: Secondary | ICD-10-CM | POA: Diagnosis not present

## 2021-04-15 LAB — POCT GLYCOSYLATED HEMOGLOBIN (HGB A1C): HbA1c, POC (controlled diabetic range): 6.8 % (ref 0.0–7.0)

## 2021-04-15 LAB — GLUCOSE, POCT (MANUAL RESULT ENTRY): POC Glucose: 114 mg/dl — AB (ref 70–99)

## 2021-04-15 NOTE — Patient Instructions (Signed)
Exercising to Stay Healthy °To become healthy and stay healthy, it is recommended that you do moderate-intensity and vigorous-intensity exercise. You can tell that you are exercising at a moderate intensity if your heart starts beating faster and you start breathing faster but can still hold a conversation. You can tell that you are exercising at a vigorous intensity if you are breathing much harder and faster and cannot hold a conversation while exercising. °How can exercise benefit me? °Exercising regularly is important. It has many health benefits, such as: °Improving overall fitness, flexibility, and endurance. °Increasing bone density. °Helping with weight control. °Decreasing body fat. °Increasing muscle strength and endurance. °Reducing stress and tension, anxiety, depression, or anger. °Improving overall health. °What guidelines should I follow while exercising? °Before you start a new exercise program, talk with your health care provider. °Do not exercise so much that you hurt yourself, feel dizzy, or get very short of breath. °Wear comfortable clothes and wear shoes with good support. °Drink plenty of water while you exercise to prevent dehydration or heat stroke. °Work out until your breathing and your heartbeat get faster (moderate intensity). °How often should I exercise? °Choose an activity that you enjoy, and set realistic goals. Your health care provider can help you make an activity plan that is individually designed and works best for you. °Exercise regularly as told by your health care provider. This may include: °Doing strength training two times a week, such as: °Lifting weights. °Using resistance bands. °Push-ups. °Sit-ups. °Yoga. °Doing a certain intensity of exercise for a given amount of time. Choose from these options: °A total of 150 minutes of moderate-intensity exercise every week. °A total of 75 minutes of vigorous-intensity exercise every week. °A mix of moderate-intensity and  vigorous-intensity exercise every week. °Children, pregnant women, people who have not exercised regularly, people who are overweight, and older adults may need to talk with a health care provider about what activities are safe to perform. If you have a medical condition, be sure to talk with your health care provider before you start a new exercise program. °What are some exercise ideas? °Moderate-intensity exercise ideas include: °Walking 1 mile (1.6 km) in about 15 minutes. °Biking. °Hiking. °Golfing. °Dancing. °Water aerobics. °Vigorous-intensity exercise ideas include: °Walking 4.5 miles (7.2 km) or more in about 1 hour. °Jogging or running 5 miles (8 km) in about 1 hour. °Biking 10 miles (16.1 km) or more in about 1 hour. °Lap swimming. °Roller-skating or in-line skating. °Cross-country skiing. °Vigorous competitive sports, such as football, basketball, and soccer. °Jumping rope. °Aerobic dancing. °What are some everyday activities that can help me get exercise? °Yard work, such as: °Pushing a lawn mower. °Raking and bagging leaves. °Washing your car. °Pushing a stroller. °Shoveling snow. °Gardening. °Washing windows or floors. °How can I be more active in my day-to-day activities? °Use stairs instead of an elevator. °Take a walk during your lunch break. °If you drive, park your car farther away from your work or school. °If you take public transportation, get off one stop early and walk the rest of the way. °Stand up or walk around during all of your indoor phone calls. °Get up, stretch, and walk around every 30 minutes throughout the day. °Enjoy exercise with a friend. Support to continue exercising will help you keep a regular routine of activity. °Where to find more information °You can find more information about exercising to stay healthy from: °U.S. Department of Health and Human Services: www.hhs.gov °Centers for Disease Control and Prevention (  CDC): www.cdc.gov °Summary °Exercising regularly is  important. It will improve your overall fitness, flexibility, and endurance. °Regular exercise will also improve your overall health. It can help you control your weight, reduce stress, and improve your bone density. °Do not exercise so much that you hurt yourself, feel dizzy, or get very short of breath. °Before you start a new exercise program, talk with your health care provider. °This information is not intended to replace advice given to you by your health care provider. Make sure you discuss any questions you have with your health care provider. °Document Revised: 10/31/2020 Document Reviewed: 10/31/2020 °Elsevier Patient Education © 2022 [employer] Inc[employer] ° °

## 2021-04-15 NOTE — Progress Notes (Signed)
Subjective:  Patient ID: Brenda [name], female    DOB: August 14, 1960  Age: 60 y.o. MRN: [id number]  CC: Diabetes   HPI Brenda [name] is a 60 y.o. year old female with a history of Type 2 Diabetes Mellitus (A1c 6.8), Hypertension, hypothyroidism, obesity.  Interval History: Denies presence of numbness or tingling in extremiites and she is up to date on eye exams. Her A1c is 6.8 down from 9.0 previously and she denies presence of hypoglycemic symptoms.  Endorses compliance with her diabetic medications. Her blood pressure is slightly elevated and she has been compliant with her antihypertensive. Also doing well on her statin and is compliant with levothyroxine for hypothyroidism. She has no additional concerns today. Past Medical History:  Diagnosis Date   Arthritis    Colon cancer ([place]) 09/08/2009   Colonoscopy by Dr Collene Mares; Surgery by Dr Donne Hazel   Diabetes mellitus    Family history of breast cancer    aunt and cousin on father's side   Family history of colon cancer    grandmother - father's side   Family history of prostate cancer    uncle - (father's brother)   Fatigue    Headache(784.0)    Hemorrhoid    Hyperlipidemia    Hypertension    Neuropathy    feet   Night sweats    Thyroid disease    Wears glasses     Past Surgical History:  Procedure Laterality Date   ABDOMINAL HYSTERECTOMY     APPENDECTOMY     COLONOSCOPY  05/14/2011   Dr.Pyrtle   CYST REMOVED     BACK OF HEAD   RIGHT COLECTOMY  09/09/2009   FOR COLON CANCER   THYROID SURGERY  09/02/2010   Clarify with patient; BENIGN PER PT   TUBAL LIGATION      Family History  Problem Relation Age of Onset   Colon polyps Mother    Diabetes Father    Heart disease Father    Hypertension Father    Kidney disease Father    Diabetes Sister    Diabetes Brother    Colon cancer Paternal Grandmother 14   Esophageal cancer Neg Hx    Rectal cancer Neg Hx    Stomach cancer Neg Hx     No Known  Allergies  Outpatient Medications Prior to Visit  Medication Sig Dispense Refill   amLODipine (NORVASC) 10 MG tablet TAKE 1 TABLET (10 MG TOTAL) BY MOUTH DAILY. FOR BLOOD PRESSURE 30 tablet 3   atorvastatin (LIPITOR) 10 MG tablet TAKE 1 TABLET (10 MG TOTAL) BY MOUTH DAILY. 30 tablet 6   Blood Glucose Monitoring Suppl (TRUE METRIX METER) w/Device KIT Use 3 times daily to check blood sugars 1 kit 0   Dulaglutide (TRULICITY) 3 WE/3.1VQ SOPN Inject 3 mg as directed once a week. 2 mL 6   fluticasone (FLONASE) 50 MCG/ACT nasal spray Place 2 sprays into both nostrils daily. 16 g 0   gabapentin (NEURONTIN) 100 MG capsule TAKE 1 CAPSULE (100 MG TOTAL) BY MOUTH 3 (THREE) TIMES DAILY. 90 capsule 3   glucose blood (TRUE METRIX BLOOD GLUCOSE TEST) test strip Use as instructed to check blood sugars 3 times daily 100 each 12   insulin detemir (LEVEMIR) 100 UNIT/ML FlexPen Inject 35 Units into the skin at bedtime. 30 mL 3   levothyroxine (SYNTHROID) 125 MCG tablet TAKE 1 TABLET (125 MCG TOTAL) BY MOUTH DAILY BEFORE BREAKFAST. 30 tablet 3   lisinopril-hydrochlorothiazide (ZESTORETIC) 20-25 MG tablet Take  1 tablet by mouth daily. 30 tablet 6   metoprolol succinate (TOPROL-XL) 50 MG 24 hr tablet TAKE 1 TABLET (50 MG TOTAL) BY MOUTH DAILY. 30 tablet 3   olopatadine (PATANOL) 0.1 % ophthalmic solution PLACE 1 DROP INTO THE RIGHT EYE 2 (TWO) TIMES DAILY. 5 mL 1   TRUEplus Lancets 28G MISC Use to help check blood sugars 3 times daily 1100 each 11   No facility-administered medications prior to visit.     ROS Review of Systems  Constitutional:  Negative for activity change, appetite change and fatigue.  HENT:  Negative for congestion, sinus pressure and sore throat.   Eyes:  Negative for visual disturbance.  Respiratory:  Negative for cough, chest tightness, shortness of breath and wheezing.   Cardiovascular:  Negative for chest pain and palpitations.  Gastrointestinal:  Negative for abdominal distention,  abdominal pain and constipation.  Endocrine: Negative for polydipsia.  Genitourinary:  Negative for dysuria and frequency.  Musculoskeletal:  Negative for arthralgias and back pain.  Skin:  Negative for rash.  Neurological:  Negative for tremors, light-headedness and numbness.  Hematological:  Does not bruise/bleed easily.  Psychiatric/Behavioral:  Negative for agitation and behavioral problems.    Objective:  BP (!) 146/83   Pulse 62   Ht 5' 8" (1.727 m)   Wt 232 lb (105.2 kg)   SpO2 100%   BMI 35.28 kg/m   BP/Weight 04/15/2021 7/65/4650 09/21/4654  Systolic BP 812 751 -  Diastolic BP 83 73 -  Wt. (Lbs) 232 237 237  BMI 35.28 36.04 36.04      Physical Exam Constitutional:      Appearance: She is well-developed.  Cardiovascular:     Rate and Rhythm: Normal rate.     Heart sounds: Normal heart sounds. No murmur heard. Pulmonary:     Effort: Pulmonary effort is normal.     Breath sounds: Normal breath sounds. No wheezing or rales.  Chest:     Chest wall: No tenderness.  Abdominal:     General: Bowel sounds are normal. There is no distension.     Palpations: Abdomen is soft. There is no mass.     Tenderness: There is no abdominal tenderness.  Musculoskeletal:        General: Normal range of motion.     Right lower leg: No edema.     Left lower leg: No edema.  Neurological:     Mental Status: She is alert and oriented to person, place, and time.  Psychiatric:        Mood and Affect: Mood normal.    CMP Latest Ref Rng & Units 12/03/2020 03/19/2020 11/07/2019  Glucose 70 - 99 mg/dL 248(H) 155(H) 130(H)  BUN 8 - 23 mg/dL 24(H) 22 18  Creatinine 0.44 - 1.00 mg/dL 1.01(H) 0.90 0.87  Sodium 135 - 145 mmol/L 135 141 139  Potassium 3.5 - 5.1 mmol/L 4.0 4.2 4.2  Chloride 98 - 111 mmol/L 100 102 101  CO2 22 - 32 mmol/L _0 Calcium 8.9 - 10.3 mg/dL 9.0 9.9 9.7  Total Protein 6.5 - 8.1 g/dL 6.9 - 7.1  Total Bilirubin 0.3 - 1.2 mg/dL 0.3 - 0.3  Alkaline Phos 38 - 126 U/L  95 - 104  AST 15 - 41 U/L 21 - 15  ALT 0 - 44 U/L 22 - 20    Lipid Panel     Component Value Date/Time   CHOL 162 11/07/2019 1110   TRIG 153 (H) 11/07/2019 1110  HDL 33 (L) 11/07/2019 1110   CHOLHDL 4.9 (H) 11/07/2019 1110   LDLCALC 102 (H) 11/07/2019 1110    CBC    Component Value Date/Time   WBC 10.9 (H) 12/03/2020 1751   RBC 4.56 12/03/2020 1751   HGB 11.9 (L) 12/03/2020 1751   HGB 11.9 10/28/2011 0936   HCT 37.5 12/03/2020 1751   HCT 35.9 10/28/2011 0936   PLT 247 12/03/2020 1751   PLT 202 10/28/2011 0936   MCV 82.2 12/03/2020 1751   MCV 82.3 10/28/2011 0936   MCH 26.1 12/03/2020 1751   MCHC 31.7 12/03/2020 1751   RDW 14.0 12/03/2020 1751   RDW 15.1 (H) 10/28/2011 0936   LYMPHSABS 2.8 12/03/2020 1751   LYMPHSABS 1.8 10/28/2011 0936   MONOABS 0.8 12/03/2020 1751   MONOABS 0.5 10/28/2011 0936   EOSABS 0.1 12/03/2020 1751   EOSABS 0.1 10/28/2011 0936   BASOSABS 0.1 12/03/2020 1751   BASOSABS 0.0 10/28/2011 0936    Lab Results  Component Value Date   HGBA1C 6.8 04/15/2021    Assessment & Plan:  1. Type 2 diabetes mellitus with diabetic polyneuropathy, with long-term current use of insulin (HCC) Controlled with A1c of 6.8; goal is less than 7.0 This is an improvement from 9.0 and she has been commended Continue current regimen Counseled on Diabetic diet, my plate method, 956 minutes of moderate intensity exercise/week Blood sugar logs with fasting goals of 80-120 mg/dl, random of less than 180 and in the event of sugars less than 60 mg/dl or greater than 400 mg/dl encouraged to notify the clinic. Advised on the need for annual eye exams, annual foot exams, Pneumonia vaccine. - POCT glucose (manual entry) - POCT glycosylated hemoglobin (Hb A1C)  2. Hypothyroidism, post thyroidectomy Controlled with normal last thyroid panel Will send of thyroid labs and adjust regimen accordingly - T4, free - TSH  3. Hypertension associated with diabetes ([place]) Slightly  above goal Advised to work on lifestyle modifications and if at next visit, BP still elevated will adjust her regimen. Counseled on blood pressure goal of less than 130/80, low-sodium, DASH diet, medication compliance, 150 minutes of moderate intensity exercise per week. Discussed medication compliance, adverse effects. - Lipid panel - CMP14+EGFR    No orders of the defined types were placed in this encounter.   Return in about 6 months (around 10/13/2021) for Medical condition.       Charlott Rakes, MD, FAAFP. [facility] and [facility], [place]   04/15/2021, 9:50 AM

## 2021-04-16 ENCOUNTER — Other Ambulatory Visit: Payer: Self-pay | Admitting: Family Medicine

## 2021-04-16 ENCOUNTER — Other Ambulatory Visit: Payer: Self-pay

## 2021-04-16 DIAGNOSIS — E039 Hypothyroidism, unspecified: Secondary | ICD-10-CM

## 2021-04-16 LAB — LIPID PANEL
Chol/HDL Ratio: 4.1 ratio (ref 0.0–4.4)
Cholesterol, Total: 123 mg/dL (ref 100–199)
HDL: 30 mg/dL — ABNORMAL LOW (ref >39)
LDL Chol Calc (NIH): 74 mg/dL (ref 0–99)
Triglycerides: 98 mg/dL (ref 0–149)
VLDL Cholesterol Cal: 19 mg/dL (ref 5–40)

## 2021-04-16 LAB — CMP14+EGFR
ALT: 18 IU/L (ref 0–32)
AST: 13 IU/L (ref 0–40)
Albumin/Globulin Ratio: 1.5 (ref 1.2–2.2)
Albumin: 4.3 g/dL (ref 3.8–4.8)
Alkaline Phosphatase: 102 IU/L (ref 44–121)
BUN/Creatinine Ratio: 23 (ref 12–28)
BUN: 21 mg/dL (ref 8–27)
Bilirubin Total: 0.5 mg/dL (ref 0.0–1.2)
CO2: 26 mmol/L (ref 20–29)
Calcium: 9.7 mg/dL (ref 8.7–10.3)
Chloride: 101 mmol/L (ref 96–106)
Creatinine, Ser: 0.9 mg/dL (ref 0.57–1.00)
Globulin, Total: 2.9 g/dL (ref 1.5–4.5)
Glucose: 111 mg/dL — ABNORMAL HIGH (ref 70–99)
Potassium: 4.2 mmol/L (ref 3.5–5.2)
Sodium: 140 mmol/L (ref 134–144)
Total Protein: 7.2 g/dL (ref 6.0–8.5)
eGFR: 72 mL/min/{1.73_m2} (ref >59)

## 2021-04-16 LAB — T4, FREE: Free T4: 1.71 ng/dL (ref 0.82–1.77)

## 2021-04-16 LAB — TSH: TSH: 0.122 u[IU]/mL — ABNORMAL LOW (ref 0.450–4.500)

## 2021-04-16 MED ORDER — LEVOTHYROXINE SODIUM 112 MCG PO TABS
112.0000 ug | ORAL_TABLET | Freq: Every day | ORAL | 3 refills | Status: DC
  Filled 2021-04-16 – 2021-05-11 (×2): qty 30, 30d supply, fill #0
  Filled 2021-06-12: qty 30, 30d supply, fill #1
  Filled 2021-07-15: qty 30, 30d supply, fill #2
  Filled 2021-08-21: qty 30, 30d supply, fill #3
  Filled 2021-08-21: qty 30, 30d supply, fill #0

## 2021-04-23 ENCOUNTER — Other Ambulatory Visit: Payer: Self-pay

## 2021-04-28 ENCOUNTER — Other Ambulatory Visit: Payer: Self-pay

## 2021-04-28 ENCOUNTER — Encounter (HOSPITAL_COMMUNITY): Payer: Self-pay | Admitting: Emergency Medicine

## 2021-04-28 ENCOUNTER — Ambulatory Visit (HOSPITAL_COMMUNITY)
Admission: EM | Admit: 2021-04-28 | Discharge: 2021-04-28 | Disposition: A | Discharge status: Home / Self Care | Payer: 59 | Attending: Urgent Care | Admitting: Urgent Care

## 2021-04-28 DIAGNOSIS — E118 Type 2 diabetes mellitus with unspecified complications: Secondary | ICD-10-CM

## 2021-04-28 DIAGNOSIS — J069 Acute upper respiratory infection, unspecified: Secondary | ICD-10-CM | POA: Diagnosis not present

## 2021-04-28 DIAGNOSIS — Z20822 Contact with and (suspected) exposure to covid-19: Secondary | ICD-10-CM | POA: Insufficient documentation

## 2021-04-28 DIAGNOSIS — R059 Cough, unspecified: Secondary | ICD-10-CM | POA: Insufficient documentation

## 2021-04-28 DIAGNOSIS — R0981 Nasal congestion: Secondary | ICD-10-CM

## 2021-04-28 DIAGNOSIS — Z794 Long term (current) use of insulin: Secondary | ICD-10-CM | POA: Insufficient documentation

## 2021-04-28 DIAGNOSIS — E119 Type 2 diabetes mellitus without complications: Secondary | ICD-10-CM | POA: Diagnosis not present

## 2021-04-28 DIAGNOSIS — Z87891 Personal history of nicotine dependence: Secondary | ICD-10-CM | POA: Diagnosis not present

## 2021-04-28 LAB — SARS CORONAVIRUS 2 (TAT 6-24 HRS): SARS Coronavirus 2: NEGATIVE

## 2021-04-28 MED ORDER — CETIRIZINE HCL 10 MG PO TABS
10.0000 mg | ORAL_TABLET | Freq: Every day | ORAL | 0 refills | Status: DC
  Filled 2021-04-28: qty 30, 30d supply, fill #0

## 2021-04-28 MED ORDER — PSEUDOEPHEDRINE HCL 30 MG PO TABS
30.0000 mg | ORAL_TABLET | Freq: Three times a day (TID) | ORAL | 0 refills | Status: DC | PRN
  Filled 2021-04-28: qty 30, 10d supply, fill #0

## 2021-04-28 MED ORDER — BENZONATATE 100 MG PO CAPS
100.0000 mg | ORAL_CAPSULE | Freq: Three times a day (TID) | ORAL | 0 refills | Status: DC | PRN
  Filled 2021-04-28: qty 60, 10d supply, fill #0

## 2021-04-28 MED ORDER — PROMETHAZINE-DM 6.25-15 MG/5ML PO SYRP
5.0000 mL | ORAL_SOLUTION | Freq: Every evening | ORAL | 0 refills | Status: DC | PRN
  Filled 2021-04-28: qty 100, 20d supply, fill #0

## 2021-04-28 NOTE — Discharge Instructions (Signed)
We will notify you of your COVID-19 test results as they arrive and may take between 48-72 hours.  I encourage you to sign up for MyChart if you have not already done so as this can be the easiest way for [place] to communicate results to you online or through a phone app.  Generally, we only contact you if it is a positive COVID result.  In the meantime, if you develop worsening symptoms including fever, chest pain, shortness of breath despite our current treatment plan then please report to the emergency room as this may be a sign of worsening status from possible COVID-19 infection.  Otherwise, we will manage this as a viral syndrome. For sore throat or cough try using a honey-based tea. Use 3 teaspoons of honey with juice squeezed from half lemon. Place shaved pieces of ginger into 1/2-1 cup of water and warm over stove top. Then mix the ingredients and repeat every 4 hours as needed. Please take Tylenol 500mg -650mg  every 6 hours for aches and pains, fevers. Hydrate very well with at least 2 liters of water. Eat light meals such as soups to replenish electrolytes and soft fruits, veggies. Start an antihistamine like Zyrtec for postnasal drainage, sinus congestion.  You can take this together with pseudoephedrine (Sudafed) at a dose of 30 mg 2-3 times a day as needed for the same kind of congestion.

## 2021-04-28 NOTE — ED Provider Notes (Signed)
[place]   MRN: [id number] DOB: 19-Jul-1961  Subjective:   Brenda [name] is a 60 y.o. female presenting for 3-day history of acute onset sinus congestion, postnasal drainage, hoarseness, loss of her voice, coughing.  Patient is COVID vaccinated, had boosters as well.  She is a type II diabetic, treated with insulin.  Has blood sugars well controlled.  No chest pain, shortness of breath, wheezing, history of respiratory disorders.  Patient is non-smoker.  No current facility-administered medications for this encounter.  Current Outpatient Medications:    amLODipine (NORVASC) 10 MG tablet, TAKE 1 TABLET (10 MG TOTAL) BY MOUTH DAILY. FOR BLOOD PRESSURE, Disp: 30 tablet, Rfl: 3   atorvastatin (LIPITOR) 10 MG tablet, TAKE 1 TABLET (10 MG TOTAL) BY MOUTH DAILY., Disp: 30 tablet, Rfl: 6   Blood Glucose Monitoring Suppl (TRUE METRIX METER) w/Device KIT, Use 3 times daily to check blood sugars, Disp: 1 kit, Rfl: 0   Dulaglutide (TRULICITY) 3 WP/7.9YI SOPN, Inject 3 mg as directed once a week., Disp: 2 mL, Rfl: 6   fluticasone (FLONASE) 50 MCG/ACT nasal spray, Place 2 sprays into both nostrils daily., Disp: 16 g, Rfl: 0   gabapentin (NEURONTIN) 100 MG capsule, TAKE 1 CAPSULE (100 MG TOTAL) BY MOUTH 3 (THREE) TIMES DAILY., Disp: 90 capsule, Rfl: 3   glucose blood (TRUE METRIX BLOOD GLUCOSE TEST) test strip, Use as instructed to check blood sugars 3 times daily, Disp: 100 each, Rfl: 12   insulin detemir (LEVEMIR) 100 UNIT/ML FlexPen, Inject 35 Units into the skin at bedtime., Disp: 30 mL, Rfl: 3   levothyroxine (SYNTHROID) 112 MCG tablet, Take 1 tablet (112 mcg total) by mouth daily before breakfast., Disp: 30 tablet, Rfl: 3   lisinopril-hydrochlorothiazide (ZESTORETIC) 20-25 MG tablet, Take 1 tablet by mouth daily., Disp: 30 tablet, Rfl: 6   metoprolol succinate (TOPROL-XL) 50 MG 24 hr tablet, TAKE 1 TABLET (50 MG TOTAL) BY MOUTH DAILY., Disp: 30 tablet, Rfl: 3   olopatadine  (PATANOL) 0.1 % ophthalmic solution, PLACE 1 DROP INTO THE RIGHT EYE 2 (TWO) TIMES DAILY., Disp: 5 mL, Rfl: 1   TRUEplus Lancets 28G MISC, Use to help check blood sugars 3 times daily, Disp: 1100 each, Rfl: 11   No Known Allergies  Past Medical History:  Diagnosis Date   Arthritis    Colon cancer ([place]) 09/08/2009   Colonoscopy by Dr Collene Mares; Surgery by Dr Donne Hazel   Diabetes mellitus    Family history of breast cancer    aunt and cousin on father's side   Family history of colon cancer    grandmother - father's side   Family history of prostate cancer    uncle - (father's brother)   Fatigue    Headache(784.0)    Hemorrhoid    Hyperlipidemia    Hypertension    Neuropathy    feet   Night sweats    Thyroid disease    Wears glasses      Past Surgical History:  Procedure Laterality Date   ABDOMINAL HYSTERECTOMY     APPENDECTOMY     COLONOSCOPY  05/14/2011   Dr.Pyrtle   CYST REMOVED     BACK OF HEAD   RIGHT COLECTOMY  09/09/2009   FOR COLON CANCER   THYROID SURGERY  09/02/2010   Clarify with patient; BENIGN PER PT   TUBAL LIGATION      Family History  Problem Relation Age of Onset   Colon polyps Mother    Diabetes  Father    Heart disease Father    Hypertension Father    Kidney disease Father    Diabetes Sister    Diabetes Brother    Colon cancer Paternal Grandmother 18   Esophageal cancer Neg Hx    Rectal cancer Neg Hx    Stomach cancer Neg Hx     Social History   Tobacco Use   Smoking status: Former    Types: Cigarettes    Quit date: 04/05/1981    Years since quitting: 40.0   Smokeless tobacco: Never  Vaping Use   Vaping Use: Never used  Substance Use Topics   Alcohol use: No   Drug use: No    ROS   Objective:   Vitals: BP 132/78 (BP Location: Right Arm) Comment (BP Location): large cuff  Pulse 83   Temp 99.8 F (37.7 C) (Oral)   Resp 20   SpO2 99%   Physical Exam Constitutional:      General: She is not in acute distress.     Appearance: Normal appearance. She is well-developed. She is obese. She is not ill-appearing, toxic-appearing or diaphoretic.  HENT:     Head: Normocephalic and atraumatic.     Right Ear: Tympanic membrane and ear canal normal. No drainage or tenderness. No middle ear effusion. Tympanic membrane is not erythematous.     Left Ear: Tympanic membrane and ear canal normal. No drainage or tenderness.  No middle ear effusion. Tympanic membrane is not erythematous.     Nose: Nose normal. No congestion or rhinorrhea.     Comments: Nasal mucosa boggy and erythematous.    Mouth/Throat:     Mouth: Mucous membranes are moist. No oral lesions.     Pharynx: No pharyngeal swelling, oropharyngeal exudate, posterior oropharyngeal erythema or uvula swelling.     Tonsils: No tonsillar exudate or tonsillar abscesses.     Comments: Hoarseness of her voice noted.  Significant postnasal drainage overlying the pharynx. Eyes:     Extraocular Movements: Extraocular movements intact.     Right eye: Normal extraocular motion.     Left eye: Normal extraocular motion.     Conjunctiva/sclera: Conjunctivae normal.     Pupils: Pupils are equal, round, and reactive to light.  Cardiovascular:     Rate and Rhythm: Normal rate and regular rhythm.     Pulses: Normal pulses.     Heart sounds: Normal heart sounds. No murmur heard.   No friction rub. No gallop.  Pulmonary:     Effort: Pulmonary effort is normal. No respiratory distress.     Breath sounds: Normal breath sounds. No stridor. No wheezing, rhonchi or rales.  Musculoskeletal:     Cervical back: Normal range of motion and neck supple.  Lymphadenopathy:     Cervical: No cervical adenopathy.  Skin:    General: Skin is warm and dry.     Findings: No rash.  Neurological:     General: No focal deficit present.     Mental Status: She is alert and oriented to person, place, and time.  Psychiatric:        Mood and Affect: Mood normal.        Behavior: Behavior  normal.        Thought Content: Thought content normal.    Assessment and Plan :   PDMP not reviewed this encounter.  1. Viral URI with cough   2. Nasal congestion   3. Type 2 diabetes mellitus treated with insulin ([place])  Will manage for viral illness such as viral URI, viral syndrome, viral rhinitis, COVID-19. Counseled patient on nature of COVID-19 including modes of transmission, diagnostic testing, management and supportive care.  Offered scripts for symptomatic relief. COVID 19 testing is pending. Counseled patient on potential for adverse effects with medications prescribed/recommended today, ER and return-to-clinic precautions discussed, patient verbalized understanding.     Jaynee Eagles, PA-C 04/28/21 1153

## 2021-04-28 NOTE — ED Triage Notes (Signed)
Onset Sunday of cough, congestion and loss of voice

## 2021-04-29 ENCOUNTER — Other Ambulatory Visit: Payer: Self-pay

## 2021-05-11 ENCOUNTER — Other Ambulatory Visit: Payer: Self-pay

## 2021-05-12 ENCOUNTER — Other Ambulatory Visit: Payer: Self-pay

## 2021-05-22 ENCOUNTER — Other Ambulatory Visit: Payer: Self-pay

## 2021-05-28 ENCOUNTER — Other Ambulatory Visit: Payer: Self-pay

## 2021-05-29 ENCOUNTER — Other Ambulatory Visit: Payer: Self-pay

## 2021-06-05 ENCOUNTER — Other Ambulatory Visit: Payer: Self-pay

## 2021-06-05 ENCOUNTER — Other Ambulatory Visit: Payer: Self-pay | Admitting: Family Medicine

## 2021-06-05 DIAGNOSIS — I152 Hypertension secondary to endocrine disorders: Secondary | ICD-10-CM

## 2021-06-05 MED ORDER — METOPROLOL SUCCINATE ER 50 MG PO TB24
ORAL_TABLET | Freq: Every day | ORAL | 3 refills | Status: DC
  Filled 2021-06-05: qty 30, 30d supply, fill #0
  Filled 2021-07-08: qty 30, 30d supply, fill #1
  Filled 2021-08-10: qty 30, 30d supply, fill #2
  Filled 2021-08-10: qty 30, 30d supply, fill #0
  Filled 2021-09-10: qty 30, 30d supply, fill #1

## 2021-06-05 MED ORDER — AMLODIPINE BESYLATE 10 MG PO TABS
ORAL_TABLET | ORAL | 3 refills | Status: DC
  Filled 2021-06-05: qty 30, 30d supply, fill #0
  Filled 2021-07-08: qty 30, 30d supply, fill #1
  Filled 2021-08-10: qty 30, 30d supply, fill #2
  Filled 2021-08-10: qty 30, 30d supply, fill #0
  Filled 2021-09-10: qty 30, 30d supply, fill #1

## 2021-06-08 ENCOUNTER — Other Ambulatory Visit: Payer: Self-pay

## 2021-06-10 ENCOUNTER — Other Ambulatory Visit: Payer: Self-pay

## 2021-06-15 ENCOUNTER — Other Ambulatory Visit: Payer: Self-pay

## 2021-06-16 ENCOUNTER — Other Ambulatory Visit: Payer: Self-pay

## 2021-06-19 ENCOUNTER — Other Ambulatory Visit: Payer: Self-pay

## 2021-06-26 ENCOUNTER — Other Ambulatory Visit: Payer: Self-pay

## 2021-06-29 ENCOUNTER — Other Ambulatory Visit: Payer: Self-pay

## 2021-06-30 ENCOUNTER — Other Ambulatory Visit: Payer: Self-pay

## 2021-07-08 ENCOUNTER — Other Ambulatory Visit: Payer: Self-pay

## 2021-07-09 ENCOUNTER — Other Ambulatory Visit: Payer: Self-pay

## 2021-07-16 ENCOUNTER — Other Ambulatory Visit: Payer: Self-pay

## 2021-07-17 ENCOUNTER — Other Ambulatory Visit: Payer: Self-pay

## 2021-07-27 ENCOUNTER — Other Ambulatory Visit: Payer: Self-pay | Admitting: Family Medicine

## 2021-07-27 ENCOUNTER — Encounter: Payer: Self-pay | Admitting: Family Medicine

## 2021-07-27 ENCOUNTER — Other Ambulatory Visit: Payer: Self-pay

## 2021-07-27 DIAGNOSIS — I152 Hypertension secondary to endocrine disorders: Secondary | ICD-10-CM

## 2021-07-27 DIAGNOSIS — E1159 Type 2 diabetes mellitus with other circulatory complications: Secondary | ICD-10-CM

## 2021-07-27 MED ORDER — SEMAGLUTIDE (2 MG/DOSE) 8 MG/3ML ~~LOC~~ SOPN
2.0000 mg | PEN_INJECTOR | SUBCUTANEOUS | 3 refills | Status: DC
  Filled 2021-07-27: qty 3, 28d supply, fill #0
  Filled 2021-08-29: qty 3, 28d supply, fill #1
  Filled 2021-10-12: qty 3, 28d supply, fill #2

## 2021-07-27 MED ORDER — LISINOPRIL-HYDROCHLOROTHIAZIDE 20-25 MG PO TABS
1.0000 | ORAL_TABLET | Freq: Every day | ORAL | 6 refills | Status: DC
  Filled 2021-07-27: qty 30, 30d supply, fill #0
  Filled 2021-08-29: qty 30, 30d supply, fill #1
  Filled 2021-09-30: qty 30, 30d supply, fill #2

## 2021-07-27 NOTE — Telephone Encounter (Signed)
Requested Prescriptions  Pending Prescriptions Disp Refills   lisinopril-hydrochlorothiazide (ZESTORETIC) 20-25 MG tablet 30 tablet 6    Sig: Take 1 tablet by mouth daily.     Cardiovascular:  ACEI + Diuretic Combos Passed - 07/27/2021  9:15 AM      Passed - Na in normal range and within 180 days    Sodium  Date Value Ref Range Status  04/15/2021 140 134 - 144 mmol/L Final         Passed - K in normal range and within 180 days    Potassium  Date Value Ref Range Status  04/15/2021 4.2 3.5 - 5.2 mmol/L Final         Passed - Cr in normal range and within 180 days    Creatinine, Ser  Date Value Ref Range Status  04/15/2021 0.90 0.57 - 1.00 mg/dL Final         Passed - Ca in normal range and within 180 days    Calcium  Date Value Ref Range Status  04/15/2021 9.7 8.7 - 10.3 mg/dL Final         Passed - Patient is not pregnant      Passed - Last BP in normal range    BP Readings from Last 1 Encounters:  04/28/21 132/78         Passed - Valid encounter within last 6 months    Recent Outpatient Visits          3 months ago Type 2 diabetes mellitus with diabetic polyneuropathy, with long-term current use of insulin ([place])   [place], Charlane Ferretti, MD   5 months ago Annual physical exam   [place], [place], MD   6 months ago Type 2 diabetes mellitus with other specified complication, with long-term current use of insulin ([place])   [place], Taylor, MD   1 year ago Type 2 diabetes mellitus with other specified complication, unspecified whether long term insulin use ([place])   [place], [place] W, NP   1 year ago Type 2 diabetes mellitus with diabetic polyneuropathy, with long-term current use of insulin ([place])   [place], Enobong, MD      Future Appointments            In 2 months Charlott Rakes, MD [place]

## 2021-07-31 ENCOUNTER — Other Ambulatory Visit: Payer: Self-pay

## 2021-08-10 ENCOUNTER — Other Ambulatory Visit: Payer: Self-pay

## 2021-08-12 ENCOUNTER — Other Ambulatory Visit: Payer: Self-pay

## 2021-08-21 ENCOUNTER — Other Ambulatory Visit: Payer: Self-pay | Admitting: Family Medicine

## 2021-08-21 ENCOUNTER — Other Ambulatory Visit: Payer: Self-pay

## 2021-08-21 DIAGNOSIS — E1142 Type 2 diabetes mellitus with diabetic polyneuropathy: Secondary | ICD-10-CM

## 2021-08-21 MED ORDER — GABAPENTIN 100 MG PO CAPS
ORAL_CAPSULE | Freq: Three times a day (TID) | ORAL | 3 refills | Status: DC
  Filled 2021-08-21: qty 90, fill #0
  Filled 2021-08-21: qty 90, 30d supply, fill #0
  Filled 2021-09-17: qty 90, 30d supply, fill #1
  Filled 2021-12-10 – 2021-12-11 (×3): qty 90, 30d supply, fill #2
  Filled 2022-02-09 – 2022-03-10 (×3): qty 90, 30d supply, fill #3

## 2021-08-31 ENCOUNTER — Other Ambulatory Visit: Payer: Self-pay

## 2021-09-02 ENCOUNTER — Other Ambulatory Visit: Payer: Self-pay

## 2021-09-03 ENCOUNTER — Other Ambulatory Visit: Payer: Self-pay

## 2021-09-07 ENCOUNTER — Other Ambulatory Visit: Payer: Self-pay

## 2021-09-10 ENCOUNTER — Other Ambulatory Visit: Payer: Self-pay

## 2021-09-11 ENCOUNTER — Other Ambulatory Visit: Payer: Self-pay

## 2021-09-17 ENCOUNTER — Other Ambulatory Visit: Payer: Self-pay | Admitting: Family Medicine

## 2021-09-17 ENCOUNTER — Other Ambulatory Visit: Payer: Self-pay

## 2021-09-17 DIAGNOSIS — E039 Hypothyroidism, unspecified: Secondary | ICD-10-CM

## 2021-09-17 MED ORDER — LEVOTHYROXINE SODIUM 112 MCG PO TABS
112.0000 ug | ORAL_TABLET | Freq: Every day | ORAL | 0 refills | Status: DC
  Filled 2021-09-17: qty 30, 30d supply, fill #0

## 2021-09-21 ENCOUNTER — Other Ambulatory Visit: Payer: Self-pay

## 2021-09-30 ENCOUNTER — Other Ambulatory Visit: Payer: Self-pay

## 2021-10-12 ENCOUNTER — Other Ambulatory Visit: Payer: Self-pay | Admitting: Family Medicine

## 2021-10-12 ENCOUNTER — Other Ambulatory Visit: Payer: Self-pay

## 2021-10-12 DIAGNOSIS — E1159 Type 2 diabetes mellitus with other circulatory complications: Secondary | ICD-10-CM

## 2021-10-13 ENCOUNTER — Ambulatory Visit: Payer: 59 | Attending: Family Medicine | Admitting: Family Medicine

## 2021-10-13 ENCOUNTER — Other Ambulatory Visit: Payer: Self-pay

## 2021-10-13 ENCOUNTER — Encounter: Payer: Self-pay | Admitting: Family Medicine

## 2021-10-13 VITALS — BP 133/81 | HR 66 | Ht 68.0 in | Wt 232.4 lb

## 2021-10-13 DIAGNOSIS — E039 Hypothyroidism, unspecified: Secondary | ICD-10-CM | POA: Diagnosis not present

## 2021-10-13 DIAGNOSIS — Z794 Long term (current) use of insulin: Secondary | ICD-10-CM

## 2021-10-13 DIAGNOSIS — I152 Hypertension secondary to endocrine disorders: Secondary | ICD-10-CM | POA: Diagnosis not present

## 2021-10-13 DIAGNOSIS — E1159 Type 2 diabetes mellitus with other circulatory complications: Secondary | ICD-10-CM | POA: Diagnosis not present

## 2021-10-13 LAB — POCT GLYCOSYLATED HEMOGLOBIN (HGB A1C): HbA1c, POC (controlled diabetic range): 6.9 % (ref 0.0–7.0)

## 2021-10-13 LAB — GLUCOSE, POCT (MANUAL RESULT ENTRY): POC Glucose: 129 mg/dl — AB (ref 70–99)

## 2021-10-13 MED ORDER — SEMAGLUTIDE (2 MG/DOSE) 8 MG/3ML ~~LOC~~ SOPN
2.0000 mg | PEN_INJECTOR | SUBCUTANEOUS | 6 refills | Status: DC
  Filled 2021-10-13 – 2021-11-12 (×2): qty 3, 28d supply, fill #0
  Filled 2021-12-10 – 2021-12-11 (×2): qty 3, 28d supply, fill #1
  Filled 2022-01-15: qty 3, 28d supply, fill #2
  Filled 2022-02-02 – 2022-02-04 (×3): qty 3, 28d supply, fill #3
  Filled 2022-03-10: qty 3, 28d supply, fill #4

## 2021-10-13 MED ORDER — LISINOPRIL-HYDROCHLOROTHIAZIDE 20-25 MG PO TABS
1.0000 | ORAL_TABLET | Freq: Every day | ORAL | 6 refills | Status: DC
  Filled 2021-10-13 – 2021-10-29 (×2): qty 30, 30d supply, fill #0
  Filled 2021-12-03: qty 30, 30d supply, fill #1
  Filled 2021-12-29: qty 30, 30d supply, fill #2
  Filled 2022-02-02: qty 30, 30d supply, fill #3
  Filled 2022-03-10: qty 30, 30d supply, fill #4
  Filled 2022-04-08: qty 30, 30d supply, fill #5

## 2021-10-13 MED ORDER — ATORVASTATIN CALCIUM 10 MG PO TABS
ORAL_TABLET | Freq: Every day | ORAL | 6 refills | Status: DC
  Filled 2021-10-13: qty 30, 30d supply, fill #0
  Filled 2021-12-10 – 2021-12-11 (×2): qty 30, 30d supply, fill #1
  Filled 2022-01-15: qty 30, 30d supply, fill #2
  Filled 2022-02-26: qty 30, 30d supply, fill #3
  Filled 2022-04-08: qty 30, 30d supply, fill #4

## 2021-10-13 MED ORDER — INSULIN DETEMIR 100 UNIT/ML FLEXPEN
35.0000 [IU] | PEN_INJECTOR | Freq: Every day | SUBCUTANEOUS | 6 refills | Status: DC
  Filled 2021-10-13: qty 15, 42d supply, fill #0
  Filled 2021-12-29: qty 15, 42d supply, fill #1
  Filled 2022-02-15 – 2022-02-26 (×2): qty 15, 42d supply, fill #2

## 2021-10-13 MED ORDER — AMLODIPINE BESYLATE 10 MG PO TABS
ORAL_TABLET | ORAL | 6 refills | Status: DC
Start: 2021-10-13 — End: 2022-04-15
  Filled 2021-10-13: qty 30, 30d supply, fill #0
  Filled 2021-11-12: qty 30, 30d supply, fill #1
  Filled 2021-12-10 – 2021-12-11 (×2): qty 30, 30d supply, fill #2
  Filled 2022-01-15: qty 30, 30d supply, fill #3
  Filled 2022-02-15: qty 30, 30d supply, fill #4
  Filled 2022-03-19: qty 30, 30d supply, fill #5
  Filled 2022-04-05: qty 30, 30d supply, fill #6

## 2021-10-13 MED ORDER — DULOXETINE HCL 60 MG PO CPEP
60.0000 mg | ORAL_CAPSULE | Freq: Every day | ORAL | 6 refills | Status: DC
  Filled 2021-10-13: qty 30, 30d supply, fill #0
  Filled 2021-11-12: qty 30, 30d supply, fill #1
  Filled 2021-12-10 – 2021-12-11 (×2): qty 30, 30d supply, fill #2
  Filled 2022-01-15: qty 30, 30d supply, fill #3
  Filled 2022-02-15: qty 30, 30d supply, fill #4
  Filled 2022-03-19: qty 30, 30d supply, fill #5

## 2021-10-13 MED ORDER — METOPROLOL SUCCINATE ER 50 MG PO TB24
ORAL_TABLET | Freq: Every day | ORAL | 6 refills | Status: DC
Start: 2021-10-13 — End: 2022-04-15
  Filled 2021-10-13: qty 30, 30d supply, fill #0
  Filled 2021-11-12: qty 30, 30d supply, fill #1
  Filled 2021-12-10 – 2021-12-11 (×2): qty 30, 30d supply, fill #2
  Filled 2022-01-15: qty 30, 30d supply, fill #3
  Filled 2022-02-15: qty 30, 30d supply, fill #4
  Filled 2022-03-29: qty 30, 30d supply, fill #5

## 2021-10-13 NOTE — Progress Notes (Signed)
? ?Subjective:  ?Patient ID: Brenda [name], female    DOB: September 04, 1960  Age: 61 y.o. MRN: [id number] ? ?CC: Diabetes ? ? ?HPI ?Brenda [name] is a 61 y.o. year old female with a history of Type 2 Diabetes Mellitus (A1c 6.9), Hypertension, hypothyroidism, obesity. ? ?Interval History: ?For diabetes she was previously on Trulicity but that had to be changed to Ozempic due to Trulicity being on backorder.  She is tolerating Ozempic okay but thinks she has noticed a difference because she thinks Ozempic is not as strong.  Her weight has been stable.  A1c was 6.8 six months ago and is 6.9 today. ?Her neuropathy has worsened and she had to decrease the Gabapentin (from tid to bid )as it caused numbness in her hands but treated the neuropathy in her feet.  Decreasing her daily dose of gabapentin did not provide relief in her hands. ?She gets a lot of walking for exercise. ? ?Last thyroid panel revealed suppressed TSH and her levothyroxine dose was adjusted in 03/2021.  She endorses compliance with levothyroxine. ?Also endorses compliance with her antihypertensive and her statin. ?Denies additional concerns today. ?Past Medical History:  ?Diagnosis Date  ? Arthritis   ? Colon cancer ([place]) 09/08/2009  ? Colonoscopy by Dr Collene Mares; Surgery by Dr Donne Hazel  ? Diabetes mellitus   ? Family history of breast cancer   ? aunt and cousin on father's side  ? Family history of colon cancer   ? grandmother - father's side  ? Family history of prostate cancer   ? uncle - (father's brother)  ? Fatigue   ? Headache(784.0)   ? Hemorrhoid   ? Hyperlipidemia   ? Hypertension   ? Neuropathy   ? feet  ? Night sweats   ? Thyroid disease   ? Wears glasses   ? ? ?Past Surgical History:  ?Procedure Laterality Date  ? ABDOMINAL HYSTERECTOMY    ? APPENDECTOMY    ? COLONOSCOPY  05/14/2011  ? Dr.Pyrtle  ? CYST REMOVED    ? BACK OF HEAD  ? RIGHT COLECTOMY  09/09/2009  ? FOR COLON CANCER  ? THYROID SURGERY  09/02/2010  ? Clarify with patient; BENIGN PER PT  ?  TUBAL LIGATION    ? ? ?Family History  ?Problem Relation Age of Onset  ? Colon polyps Mother   ? Diabetes Father   ? Heart disease Father   ? Hypertension Father   ? Kidney disease Father   ? Diabetes Sister   ? Diabetes Brother   ? Colon cancer Paternal Grandmother 43  ? Esophageal cancer Neg Hx   ? Rectal cancer Neg Hx   ? Stomach cancer Neg Hx   ? ? ?Social History  ? ?Socioeconomic History  ? Marital status: Married  ?  Spouse name: Not on file  ? Number of children: Not on file  ? Years of education: Not on file  ? Highest education level: Not on file  ?Occupational History  ? Not on file  ?Tobacco Use  ? Smoking status: Former  ?  Types: Cigarettes  ?  Quit date: 04/05/1981  ?  Years since quitting: 40.5  ? Smokeless tobacco: Never  ?Vaping Use  ? Vaping Use: Never used  ?Substance and Sexual Activity  ? Alcohol use: No  ? Drug use: No  ? Sexual activity: Not Currently  ?Other Topics Concern  ? Not on file  ?Social History Narrative  ? Not on file  ? ?Social Determinants of  Health  ? ?Financial Resource Strain: Not on file  ?Food Insecurity: Not on file  ?Transportation Needs: Not on file  ?Physical Activity: Not on file  ?Stress: Not on file  ?Social Connections: Not on file  ? ? ?No Known Allergies ? ?Outpatient Medications Prior to Visit  ?Medication Sig Dispense Refill  ? Blood Glucose Monitoring Suppl (TRUE METRIX METER) w/Device KIT Use 3 times daily to check blood sugars 1 kit 0  ? fluticasone (FLONASE) 50 MCG/ACT nasal spray Place 2 sprays into both nostrils daily. 16 g 0  ? gabapentin (NEURONTIN) 100 MG capsule TAKE 1 CAPSULE (100 MG TOTAL) BY MOUTH 3 (THREE) TIMES DAILY. 90 capsule 3  ? glucose blood (TRUE METRIX BLOOD GLUCOSE TEST) test strip Use as instructed to check blood sugars 3 times daily 100 each 12  ? levothyroxine (SYNTHROID) 112 MCG tablet Take 1 tablet (112 mcg total) by mouth daily before breakfast. 30 tablet 0  ? olopatadine (PATANOL) 0.1 % ophthalmic solution PLACE 1 DROP INTO THE  RIGHT EYE 2 (TWO) TIMES DAILY. 5 mL 1  ? TRUEplus Lancets 28G MISC Use to help check blood sugars 3 times daily 1100 each 11  ? amLODipine (NORVASC) 10 MG tablet TAKE 1 TABLET (10 MG TOTAL) BY MOUTH DAILY. FOR BLOOD PRESSURE 30 tablet 3  ? atorvastatin (LIPITOR) 10 MG tablet TAKE 1 TABLET (10 MG TOTAL) BY MOUTH DAILY. 30 tablet 6  ? insulin detemir (LEVEMIR) 100 UNIT/ML FlexPen Inject 35 Units into the skin at bedtime. 30 mL 3  ? lisinopril-hydrochlorothiazide (ZESTORETIC) 20-25 MG tablet Take 1 tablet by mouth daily. 30 tablet 6  ? metoprolol succinate (TOPROL-XL) 50 MG 24 hr tablet TAKE 1 TABLET (50 MG TOTAL) BY MOUTH DAILY. 30 tablet 3  ? Semaglutide, 2 MG/DOSE, 8 MG/3ML SOPN Inject 2 mg as directed once a week. 3 mL 3  ? benzonatate (TESSALON) 100 MG capsule Take 1-2 capsules (100-200 mg total) by mouth 3 (three) times daily as needed for cough. (Patient not taking: Reported on 10/13/2021) 60 capsule 0  ? cetirizine (ZYRTEC ALLERGY) 10 MG tablet Take 1 tablet (10 mg total) by mouth daily. 30 tablet 0  ? promethazine-dextromethorphan (PROMETHAZINE-DM) 6.25-15 MG/5ML syrup Take 5 mLs by mouth at bedtime as needed for cough. 100 mL 0  ? pseudoephedrine (SUDAFED) 30 MG tablet Take 1 tablet (30 mg total) by mouth every 8 (eight) hours as needed for congestion. 30 tablet 0  ? ?No facility-administered medications prior to visit.  ? ? ? ?ROS ?Review of Systems  ?Constitutional:  Negative for activity change, appetite change and fatigue.  ?HENT:  Negative for congestion, sinus pressure and sore throat.   ?Eyes:  Negative for visual disturbance.  ?Respiratory:  Negative for cough, chest tightness, shortness of breath and wheezing.   ?Cardiovascular:  Negative for chest pain and palpitations.  ?Gastrointestinal:  Negative for abdominal distention, abdominal pain and constipation.  ?Endocrine: Negative for polydipsia.  ?Genitourinary:  Negative for dysuria and frequency.  ?Musculoskeletal:  Negative for arthralgias and back  pain.  ?Skin:  Negative for rash.  ?Neurological:  Negative for tremors, light-headedness and numbness.  ?Hematological:  Does not bruise/bleed easily.  ?Psychiatric/Behavioral:  Negative for agitation and behavioral problems.   ? ?Objective:  ?BP 133/81   Pulse 66   Ht _0  (1.727 m)   Wt 232 lb 6.4 oz (105.4 kg)   SpO2 100%   BMI 35.34 kg/m?  ? ? ?  10/13/2021  ?  9:11 AM  04/28/2021  ? 11:25 AM 04/15/2021  ?  9:34 AM  ?BP/Weight  ?Systolic BP 443 154 008  ?Diastolic BP 81 78 83  ?Wt. (Lbs) 232.4  232  ?BMI 35.34 kg/m2  35.28 kg/m2  ? ? ? ? ?Physical Exam ?Constitutional:   ?   Appearance: She is well-developed.  ?Cardiovascular:  ?   Rate and Rhythm: Normal rate.  ?   Heart sounds: Normal heart sounds. No murmur heard. ?Pulmonary:  ?   Effort: Pulmonary effort is normal.  ?   Breath sounds: Normal breath sounds. No wheezing or rales.  ?Chest:  ?   Chest wall: No tenderness.  ?Abdominal:  ?   General: Bowel sounds are normal. There is no distension.  ?   Palpations: Abdomen is soft. There is no mass.  ?   Tenderness: There is no abdominal tenderness.  ?Musculoskeletal:     ?   General: Normal range of motion.  ?   Right lower leg: No edema.  ?   Left lower leg: No edema.  ?Neurological:  ?   Mental Status: She is alert and oriented to person, place, and time.  ?Psychiatric:     ?   Mood and Affect: Mood normal.  ? ? ? ?  Latest Ref Rng & Units 04/15/2021  ? 10:14 AM 12/03/2020  ?  5:51 PM 03/19/2020  ? 10:51 AM  ?CMP  ?Glucose 70 - 99 mg/dL 111   248   155    ?BUN 8 - 27 mg/dL _0 ?Creatinine 0.57 - 1.00 mg/dL 0.90   1.01   0.90    ?Sodium 134 - 144 mmol/L 140   135   141    ?Potassium 3.5 - 5.2 mmol/L 4.2   4.0   4.2    ?Chloride 96 - 106 mmol/L 101   100   102    ?CO2 20 - 29 mmol/L _1 ?Calcium 8.7 - 10.3 mg/dL 9.7   9.0   9.9    ?Total Protein 6.0 - 8.5 g/dL 7.2   6.9     ?Total Bilirubin 0.0 - 1.2 mg/dL 0.5   0.3     ?Alkaline Phos 44 - 121 IU/L 102   95     ?AST 0 - 40 IU/L 13   21      ?ALT 0 - 32 IU/L 18   22     ? ? ?Lipid Panel  ?   ?Component Value Date/Time  ? CHOL 123 04/15/2021 1014  ? TRIG 98 04/15/2021 1014  ? HDL 30 (L) 04/15/2021 1014  ? CHOLHDL 4.1 04/15/2021 1014  ? [place] 7

## 2021-10-13 NOTE — Patient Instructions (Signed)

## 2021-10-14 ENCOUNTER — Other Ambulatory Visit: Payer: Self-pay | Admitting: Family Medicine

## 2021-10-14 ENCOUNTER — Other Ambulatory Visit: Payer: Self-pay

## 2021-10-14 DIAGNOSIS — E039 Hypothyroidism, unspecified: Secondary | ICD-10-CM

## 2021-10-14 DIAGNOSIS — R809 Proteinuria, unspecified: Secondary | ICD-10-CM

## 2021-10-14 LAB — CMP14+EGFR
ALT: 20 IU/L (ref 0–32)
AST: 18 IU/L (ref 0–40)
Albumin/Globulin Ratio: 1.9 (ref 1.2–2.2)
Albumin: 4.4 g/dL (ref 3.8–4.8)
Alkaline Phosphatase: 102 IU/L (ref 44–121)
BUN/Creatinine Ratio: 28 (ref 12–28)
BUN: 26 mg/dL (ref 8–27)
Bilirubin Total: 0.3 mg/dL (ref 0.0–1.2)
CO2: 22 mmol/L (ref 20–29)
Calcium: 9 mg/dL (ref 8.7–10.3)
Chloride: 101 mmol/L (ref 96–106)
Creatinine, Ser: 0.92 mg/dL (ref 0.57–1.00)
Globulin, Total: 2.3 g/dL (ref 1.5–4.5)
Glucose: 116 mg/dL — ABNORMAL HIGH (ref 70–99)
Potassium: 4.4 mmol/L (ref 3.5–5.2)
Sodium: 142 mmol/L (ref 134–144)
Total Protein: 6.7 g/dL (ref 6.0–8.5)
eGFR: 70 mL/min/{1.73_m2} (ref >59)

## 2021-10-14 LAB — LP+NON-HDL CHOLESTEROL
Cholesterol, Total: 169 mg/dL (ref 100–199)
HDL: 33 mg/dL — ABNORMAL LOW (ref >39)
LDL Chol Calc (NIH): 109 mg/dL — ABNORMAL HIGH (ref 0–99)
Total Non-HDL-Chol (LDL+VLDL): 136 mg/dL — ABNORMAL HIGH (ref 0–129)
Triglycerides: 150 mg/dL — ABNORMAL HIGH (ref 0–149)
VLDL Cholesterol Cal: 27 mg/dL (ref 5–40)

## 2021-10-14 LAB — MICROALBUMIN / CREATININE URINE RATIO
Creatinine, Urine: 121.7 mg/dL
Microalb/Creat Ratio: 63 mg/g creat — ABNORMAL HIGH (ref 0–29)
Microalbumin, Urine: 76.9 ug/mL

## 2021-10-14 LAB — T4, FREE: Free T4: 1.51 ng/dL (ref 0.82–1.77)

## 2021-10-14 LAB — TSH: TSH: 0.886 u[IU]/mL (ref 0.450–4.500)

## 2021-10-14 MED ORDER — LEVOTHYROXINE SODIUM 112 MCG PO TABS
112.0000 ug | ORAL_TABLET | Freq: Every day | ORAL | 6 refills | Status: DC
  Filled 2021-10-14 – 2021-10-22 (×2): qty 30, 30d supply, fill #0
  Filled 2021-12-03: qty 30, 30d supply, fill #1
  Filled 2021-12-29: qty 30, 30d supply, fill #2
  Filled 2022-02-02: qty 30, 30d supply, fill #3
  Filled 2022-02-26: qty 30, 30d supply, fill #4
  Filled 2022-04-08: qty 30, 30d supply, fill #5

## 2021-10-14 MED ORDER — DAPAGLIFLOZIN PROPANEDIOL 5 MG PO TABS
5.0000 mg | ORAL_TABLET | Freq: Every day | ORAL | 6 refills | Status: DC
  Filled 2021-10-14 – 2021-10-23 (×2): qty 30, 30d supply, fill #0
  Filled 2022-01-15: qty 30, 30d supply, fill #1
  Filled 2022-03-10: qty 30, 30d supply, fill #2

## 2021-10-21 ENCOUNTER — Other Ambulatory Visit: Payer: Self-pay

## 2021-10-22 ENCOUNTER — Other Ambulatory Visit: Payer: Self-pay

## 2021-10-23 ENCOUNTER — Other Ambulatory Visit: Payer: Self-pay

## 2021-10-29 ENCOUNTER — Other Ambulatory Visit: Payer: Self-pay

## 2021-10-30 ENCOUNTER — Other Ambulatory Visit: Payer: Self-pay

## 2021-11-12 ENCOUNTER — Other Ambulatory Visit: Payer: Self-pay

## 2021-12-03 ENCOUNTER — Other Ambulatory Visit: Payer: Self-pay

## 2021-12-04 ENCOUNTER — Other Ambulatory Visit: Payer: Self-pay

## 2021-12-10 ENCOUNTER — Encounter: Payer: Self-pay | Admitting: Family Medicine

## 2021-12-11 ENCOUNTER — Other Ambulatory Visit: Payer: Self-pay

## 2021-12-11 ENCOUNTER — Other Ambulatory Visit: Payer: Self-pay | Admitting: Internal Medicine

## 2021-12-11 MED ORDER — INSULIN PEN NEEDLE 31G X 5 MM MISC
4 refills | Status: DC
  Filled 2021-12-11: qty 100, 30d supply, fill #0
  Filled 2022-04-30: qty 100, 30d supply, fill #1
  Filled 2022-07-05: qty 100, 30d supply, fill #2

## 2021-12-29 ENCOUNTER — Other Ambulatory Visit: Payer: Self-pay

## 2022-01-15 ENCOUNTER — Other Ambulatory Visit: Payer: Self-pay

## 2022-01-29 ENCOUNTER — Other Ambulatory Visit: Payer: Self-pay

## 2022-02-02 ENCOUNTER — Other Ambulatory Visit: Payer: Self-pay

## 2022-02-04 ENCOUNTER — Other Ambulatory Visit: Payer: Self-pay

## 2022-02-05 ENCOUNTER — Other Ambulatory Visit: Payer: Self-pay

## 2022-02-09 ENCOUNTER — Other Ambulatory Visit: Payer: Self-pay

## 2022-02-16 ENCOUNTER — Other Ambulatory Visit: Payer: Self-pay

## 2022-02-17 ENCOUNTER — Other Ambulatory Visit: Payer: Self-pay

## 2022-02-18 ENCOUNTER — Other Ambulatory Visit: Payer: Self-pay

## 2022-02-22 ENCOUNTER — Other Ambulatory Visit: Payer: Self-pay

## 2022-02-23 ENCOUNTER — Other Ambulatory Visit: Payer: Self-pay

## 2022-02-26 ENCOUNTER — Other Ambulatory Visit: Payer: Self-pay | Admitting: Pharmacist

## 2022-02-26 ENCOUNTER — Other Ambulatory Visit: Payer: Self-pay

## 2022-02-26 MED ORDER — BASAGLAR KWIKPEN 100 UNIT/ML ~~LOC~~ SOPN
35.0000 [IU] | PEN_INJECTOR | Freq: Every day | SUBCUTANEOUS | 1 refills | Status: DC
  Filled 2022-02-26: qty 9, 25d supply, fill #0

## 2022-03-04 ENCOUNTER — Other Ambulatory Visit: Payer: Self-pay

## 2022-03-10 ENCOUNTER — Other Ambulatory Visit: Payer: Self-pay

## 2022-03-11 ENCOUNTER — Other Ambulatory Visit: Payer: Self-pay

## 2022-03-19 ENCOUNTER — Other Ambulatory Visit: Payer: Self-pay

## 2022-03-29 ENCOUNTER — Other Ambulatory Visit: Payer: Self-pay

## 2022-04-05 ENCOUNTER — Other Ambulatory Visit: Payer: Self-pay

## 2022-04-09 ENCOUNTER — Other Ambulatory Visit: Payer: Self-pay

## 2022-04-15 ENCOUNTER — Ambulatory Visit: Payer: Commercial Managed Care - HMO | Attending: Family Medicine | Admitting: Family Medicine

## 2022-04-15 ENCOUNTER — Encounter: Payer: Self-pay | Admitting: Family Medicine

## 2022-04-15 ENCOUNTER — Other Ambulatory Visit: Payer: Self-pay

## 2022-04-15 VITALS — BP 134/76 | HR 60 | Temp 98.4°F | Ht 68.0 in | Wt 218.6 lb

## 2022-04-15 DIAGNOSIS — E1142 Type 2 diabetes mellitus with diabetic polyneuropathy: Secondary | ICD-10-CM

## 2022-04-15 DIAGNOSIS — E1159 Type 2 diabetes mellitus with other circulatory complications: Secondary | ICD-10-CM

## 2022-04-15 DIAGNOSIS — R809 Proteinuria, unspecified: Secondary | ICD-10-CM

## 2022-04-15 DIAGNOSIS — Z794 Long term (current) use of insulin: Secondary | ICD-10-CM | POA: Diagnosis not present

## 2022-04-15 DIAGNOSIS — I152 Hypertension secondary to endocrine disorders: Secondary | ICD-10-CM

## 2022-04-15 DIAGNOSIS — Z1231 Encounter for screening mammogram for malignant neoplasm of breast: Secondary | ICD-10-CM | POA: Diagnosis not present

## 2022-04-15 DIAGNOSIS — E1129 Type 2 diabetes mellitus with other diabetic kidney complication: Secondary | ICD-10-CM | POA: Diagnosis not present

## 2022-04-15 LAB — POCT GLYCOSYLATED HEMOGLOBIN (HGB A1C): HbA1c, POC (controlled diabetic range): 6.6 % (ref 0.0–7.0)

## 2022-04-15 LAB — GLUCOSE, POCT (MANUAL RESULT ENTRY): POC Glucose: 129 mg/dl — AB (ref 70–99)

## 2022-04-15 MED ORDER — METOPROLOL SUCCINATE ER 50 MG PO TB24
ORAL_TABLET | Freq: Every day | ORAL | 1 refills | Status: DC
  Filled 2022-04-15 – 2022-04-30 (×2): qty 30, 30d supply, fill #0
  Filled 2022-06-04: qty 30, 30d supply, fill #1
  Filled 2022-07-02: qty 30, 30d supply, fill #2
  Filled 2022-07-16 – 2022-07-19 (×3): qty 30, 30d supply, fill #3
  Filled 2022-07-30: qty 90, 90d supply, fill #3

## 2022-04-15 MED ORDER — ATORVASTATIN CALCIUM 20 MG PO TABS
20.0000 mg | ORAL_TABLET | Freq: Every day | ORAL | 1 refills | Status: DC
  Filled 2022-04-15: qty 30, 30d supply, fill #0
  Filled 2022-07-05: qty 30, 30d supply, fill #1
  Filled 2022-07-30: qty 90, 90d supply, fill #2

## 2022-04-15 MED ORDER — BASAGLAR KWIKPEN 100 UNIT/ML ~~LOC~~ SOPN
35.0000 [IU] | PEN_INJECTOR | Freq: Every day | SUBCUTANEOUS | 6 refills | Status: DC
  Filled 2022-04-15: qty 9, 25d supply, fill #0
  Filled 2022-05-28: qty 9, 25d supply, fill #1
  Filled 2022-07-02: qty 9, 25d supply, fill #2
  Filled 2022-07-27: qty 9, 25d supply, fill #3
  Filled 2022-08-23: qty 9, 25d supply, fill #4

## 2022-04-15 MED ORDER — LISINOPRIL-HYDROCHLOROTHIAZIDE 20-25 MG PO TABS
1.0000 | ORAL_TABLET | Freq: Every day | ORAL | 1 refills | Status: DC
  Filled 2022-04-15 – 2022-05-11 (×2): qty 30, 30d supply, fill #0
  Filled 2022-06-04: qty 30, 30d supply, fill #1
  Filled 2022-07-16: qty 30, 30d supply, fill #2
  Filled 2022-08-12: qty 30, 30d supply, fill #3
  Filled 2022-09-29: qty 30, 30d supply, fill #4

## 2022-04-15 MED ORDER — DAPAGLIFLOZIN PROPANEDIOL 5 MG PO TABS
5.0000 mg | ORAL_TABLET | Freq: Every day | ORAL | 1 refills | Status: DC
  Filled 2022-04-15: qty 30, 30d supply, fill #0
  Filled 2022-05-28: qty 30, 30d supply, fill #1
  Filled 2022-07-02: qty 30, 30d supply, fill #2
  Filled 2022-07-30: qty 90, 90d supply, fill #3

## 2022-04-15 MED ORDER — AMLODIPINE BESYLATE 10 MG PO TABS
10.0000 mg | ORAL_TABLET | Freq: Every day | ORAL | 1 refills | Status: DC
  Filled 2022-04-15: qty 90, 90d supply, fill #0
  Filled 2022-05-11: qty 30, 30d supply, fill #0
  Filled 2022-06-04: qty 30, 30d supply, fill #1
  Filled 2022-07-16 (×2): qty 30, 30d supply, fill #2
  Filled 2022-08-12: qty 30, 30d supply, fill #3
  Filled 2022-10-13: qty 30, 30d supply, fill #4

## 2022-04-15 MED ORDER — SEMAGLUTIDE (2 MG/DOSE) 8 MG/3ML ~~LOC~~ SOPN
2.0000 mg | PEN_INJECTOR | SUBCUTANEOUS | 6 refills | Status: DC
  Filled 2022-04-15: qty 3, 28d supply, fill #0
  Filled 2022-05-28: qty 3, 28d supply, fill #1
  Filled 2022-07-02: qty 3, 28d supply, fill #2
  Filled 2022-07-27 – 2022-07-30 (×3): qty 3, 28d supply, fill #3
  Filled 2022-08-23 – 2022-09-03 (×2): qty 3, 28d supply, fill #4
  Filled 2022-09-29: qty 3, 28d supply, fill #5
  Filled 2022-11-08: qty 3, 28d supply, fill #6

## 2022-04-15 MED ORDER — GABAPENTIN 100 MG PO CAPS
ORAL_CAPSULE | Freq: Three times a day (TID) | ORAL | 3 refills | Status: DC
  Filled 2022-04-15: qty 90, 30d supply, fill #0
  Filled 2022-07-05: qty 90, 30d supply, fill #1
  Filled 2022-07-30 (×2): qty 90, 30d supply, fill #2
  Filled 2022-11-08: qty 90, 30d supply, fill #3

## 2022-04-15 MED ORDER — DULOXETINE HCL 60 MG PO CPEP
60.0000 mg | ORAL_CAPSULE | Freq: Every day | ORAL | 1 refills | Status: DC
  Filled 2022-04-15: qty 30, 30d supply, fill #0
  Filled 2022-05-21: qty 30, 30d supply, fill #1
  Filled 2022-07-02: qty 30, 30d supply, fill #2
  Filled 2022-07-30: qty 90, 90d supply, fill #3

## 2022-04-15 NOTE — Patient Instructions (Signed)
Exercising to Stay Healthy To become healthy and stay healthy, it is recommended that you do moderate-intensity and vigorous-intensity exercise. You can tell that you are exercising at a moderate intensity if your heart starts beating faster and you start breathing faster but can still hold a conversation. You can tell that you are exercising at a vigorous intensity if you are breathing much harder and faster and cannot hold a conversation while exercising. How can exercise benefit me? Exercising regularly is important. It has many health benefits, such as: Improving overall fitness, flexibility, and endurance. Increasing bone density. Helping with weight control. Decreasing body fat. Increasing muscle strength and endurance. Reducing stress and tension, anxiety, depression, or anger. Improving overall health. What guidelines should I follow while exercising? Before you start a new exercise program, talk with your health care provider. Do not exercise so much that you hurt yourself, feel dizzy, or get very short of breath. Wear comfortable clothes and wear shoes with good support. Drink plenty of water while you exercise to prevent dehydration or heat stroke. Work out until your breathing and your heartbeat get faster (moderate intensity). How often should I exercise? Choose an activity that you enjoy, and set realistic goals. Your health care provider can help you make an activity plan that is individually designed and works best for you. Exercise regularly as told by your health care provider. This may include: Doing strength training two times a week, such as: Lifting weights. Using resistance bands. Push-ups. Sit-ups. Yoga. Doing a certain intensity of exercise for a given amount of time. Choose from these options: A total of 150 minutes of moderate-intensity exercise every week. A total of 75 minutes of vigorous-intensity exercise every week. A mix of moderate-intensity and  vigorous-intensity exercise every week. Children, pregnant women, people who have not exercised regularly, people who are overweight, and older adults may need to talk with a health care provider about what activities are safe to perform. If you have a medical condition, be sure to talk with your health care provider before you start a new exercise program. What are some exercise ideas? Moderate-intensity exercise ideas include: Walking 1 mile (1.6 km) in about 15 minutes. Biking. Hiking. Golfing. Dancing. Water aerobics. Vigorous-intensity exercise ideas include: Walking 4.5 miles (7.2 km) or more in about 1 hour. Jogging or running 5 miles (8 km) in about 1 hour. Biking 10 miles (16.1 km) or more in about 1 hour. Lap swimming. Roller-skating or in-line skating. Cross-country skiing. Vigorous competitive sports, such as football, basketball, and soccer. Jumping rope. Aerobic dancing. What are some everyday activities that can help me get exercise? Yard work, such as: Pushing a lawn mower. Raking and bagging leaves. Washing your car. Pushing a stroller. Shoveling snow. Gardening. Washing windows or floors. How can I be more active in my day-to-day activities? Use stairs instead of an elevator. Take a walk during your lunch break. If you drive, park your car farther away from your work or school. If you take public transportation, get off one stop early and walk the rest of the way. Stand up or walk around during all of your indoor phone calls. Get up, stretch, and walk around every 30 minutes throughout the day. Enjoy exercise with a friend. Support to continue exercising will help you keep a regular routine of activity. Where to find more information You can find more information about exercising to stay healthy from: U.S. Department of Health and Human Services: www.hhs.gov Centers for Disease Control and Prevention (  CDC): www.cdc.gov Summary Exercising regularly is  important. It will improve your overall fitness, flexibility, and endurance. Regular exercise will also improve your overall health. It can help you control your weight, reduce stress, and improve your bone density. Do not exercise so much that you hurt yourself, feel dizzy, or get very short of breath. Before you start a new exercise program, talk with your health care provider. This information is not intended to replace advice given to you by your health care provider. Make sure you discuss any questions you have with your health care provider. Document Revised: 10/31/2020 Document Reviewed: 10/31/2020 Elsevier Patient Education  2023 [employer]  

## 2022-04-15 NOTE — Progress Notes (Signed)
Subjective:  Patient ID: Brenda [name], female    DOB: 11-Jul-1961  Age: 60 y.o. MRN: [id number]  CC: Diabetes   HPI Brenda [name] is a 61 y.o. year old female with a history of Type 2 Diabetes Mellitus (A1c 6.6), Hypertension, hypothyroidism, obesity.  Interval History:  Her Neuropathy is better now after Cymbalta was added to her gabapentin at her last office visit. With regards to her diabetes she does not experience hypoglycemia and is adherent with her current regimen.  She has no visual concerns.  Tolerating her antihypertensive and her statin with no adverse effects from her medications. 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 For hypothyroidism she is consistent with her levothyroxine. Denies additional concerns today. Past Medical History:  Diagnosis Date   Arthritis    Colon cancer ([place]) 09/08/2009   Colonoscopy by Dr Collene Mares; Surgery by Dr Donne Hazel   Diabetes mellitus    Family history of breast cancer    aunt and cousin on father's side   Family history of colon cancer    grandmother - father's side   Family history of prostate cancer    uncle - (father's brother)   Fatigue    Headache(784.0)    Hemorrhoid    Hyperlipidemia    Hypertension    Neuropathy    feet   Night sweats    Thyroid disease    Wears glasses     Past Surgical History:  Procedure Laterality Date   ABDOMINAL HYSTERECTOMY     APPENDECTOMY     COLONOSCOPY  05/14/2011   Dr.Pyrtle   CYST REMOVED     BACK OF HEAD   RIGHT COLECTOMY  09/09/2009   FOR COLON CANCER   THYROID SURGERY  09/02/2010   Clarify with patient; BENIGN PER PT   TUBAL LIGATION      Family History  Problem Relation Age of Onset   Colon polyps Mother    Diabetes Father    Heart disease Father    Hypertension Father    Kidney disease Father    Diabetes Sister    Diabetes Brother    Colon cancer Paternal Grandmother 50   Esophageal cancer Neg Hx    Rectal cancer Neg Hx    Stomach  cancer Neg Hx     Social History   Socioeconomic History   Marital status: Married    Spouse name: Not on file   Number of children: Not on file   Years of education: Not on file   Highest education level: Not on file  Occupational History   Not on file  Tobacco Use   Smoking status: Former    Types: Cigarettes    Quit date: 04/05/1981    Years since quitting: 41.0   Smokeless tobacco: Never  Vaping Use   Vaping Use: Never used  Substance and Sexual Activity   Alcohol use: No   Drug use: No   Sexual activity: Not Currently  Other Topics Concern   Not on file  Social History Narrative   Not on file   Social Determinants of Health   Financial Resource Strain: Not on file  Food Insecurity: Not on file  Transportation Needs: Not on file  Physical Activity: Not on file  Stress: Not on file  Social Connections: Not on file    No Known Allergies  Outpatient Medications Prior to Visit  Medication Sig Dispense Refill   Blood Glucose Monitoring Suppl (TRUE  METRIX METER) w/Device KIT Use 3 times daily to check blood sugars 1 kit 0   fluticasone (FLONASE) 50 MCG/ACT nasal spray Place 2 sprays into both nostrils daily. 16 g 0   glucose blood (TRUE METRIX BLOOD GLUCOSE TEST) test strip Use as instructed to check blood sugars 3 times daily 100 each 12   Insulin Pen Needle 31G X 5 MM MISC Use as directed. 100 each 4   levothyroxine (SYNTHROID) 112 MCG tablet Take 1 tablet (112 mcg total) by mouth daily before breakfast. 30 tablet 6   TRUEplus Lancets 28G MISC Use to help check blood sugars 3 times daily 1100 each 11   amLODipine (NORVASC) 10 MG tablet TAKE 1 TABLET (10 MG TOTAL) BY MOUTH DAILY. FOR BLOOD PRESSURE 30 tablet 6   atorvastatin (LIPITOR) 10 MG tablet TAKE 1 TABLET (10 MG TOTAL) BY MOUTH DAILY. 30 tablet 6   dapagliflozin propanediol (FARXIGA) 5 MG TABS tablet Take 1 tablet (5 mg total) by mouth daily before breakfast. 30 tablet 6   DULoxetine (CYMBALTA) 60 MG capsule  Take 1 capsule (60 mg total) by mouth daily. For neuropathy 30 capsule 6   gabapentin (NEURONTIN) 100 MG capsule TAKE 1 CAPSULE (100 MG TOTAL) BY MOUTH 3 (THREE) TIMES DAILY. 90 capsule 3   Insulin Glargine (BASAGLAR KWIKPEN) 100 UNIT/ML Inject 35 Units into the skin daily. 15 mL 1   lisinopril-hydrochlorothiazide (ZESTORETIC) 20-25 MG tablet Take 1 tablet by mouth daily. 30 tablet 6   metoprolol succinate (TOPROL-XL) 50 MG 24 hr tablet TAKE 1 TABLET (50 MG TOTAL) BY MOUTH DAILY. 30 tablet 6   Semaglutide, 2 MG/DOSE, 8 MG/3ML SOPN Inject 2 mg as directed once a week. 3 mL 6   benzonatate (TESSALON) 100 MG capsule Take 1-2 capsules (100-200 mg total) by mouth 3 (three) times daily as needed for cough. (Patient not taking: Reported on 10/13/2021) 60 capsule 0   No facility-administered medications prior to visit.     ROS Review of Systems  Constitutional:  Negative for activity change and appetite change.  HENT:  Negative for sinus pressure and sore throat.   Respiratory:  Negative for chest tightness, shortness of breath and wheezing.   Cardiovascular:  Negative for chest pain and palpitations.  Gastrointestinal:  Negative for abdominal distention, abdominal pain and constipation.  Genitourinary: Negative.   Musculoskeletal: Negative.   Psychiatric/Behavioral:  Negative for behavioral problems and dysphoric mood.     Objective:  BP 134/76   Pulse 60   Temp 98.4 F (36.9 C) (Oral)   Ht 5' 8"  (1.727 m)   Wt 218 lb 9.6 oz (99.2 kg)   SpO2 100%   BMI 33.24 kg/m      04/15/2022    9:44 AM 10/13/2021    9:11 AM 04/28/2021   11:25 AM  BP/Weight  Systolic BP 758 832 549  Diastolic BP 76 81 78  Wt. (Lbs) 218.6 232.4   BMI 33.24 kg/m2 35.34 kg/m2       Physical Exam Constitutional:      Appearance: She is well-developed.  Cardiovascular:     Rate and Rhythm: Normal rate.     Heart sounds: Normal heart sounds. No murmur heard. Pulmonary:     Effort: Pulmonary effort is  normal.     Breath sounds: Normal breath sounds. No wheezing or rales.  Chest:     Chest wall: No tenderness.  Abdominal:     General: Bowel sounds are normal. There is no distension.  Palpations: Abdomen is soft. There is no mass.     Tenderness: There is no abdominal tenderness.  Musculoskeletal:        General: Normal range of motion.     Right lower leg: No edema.     Left lower leg: No edema.  Neurological:     Mental Status: She is alert and oriented to person, place, and time.  Psychiatric:        Mood and Affect: Mood normal.        Latest Ref Rng & Units 10/13/2021    9:59 AM 04/15/2021   10:14 AM 12/03/2020    5:51 PM  CMP  Glucose 70 - 99 mg/dL 116  111  248   BUN 8 - 27 mg/dL 26  21  24    Creatinine 0.57 - 1.00 mg/dL 0.92  0.90  1.01   Sodium 134 - 144 mmol/L 142  140  135   Potassium 3.5 - 5.2 mmol/L 4.4  4.2  4.0   Chloride 96 - 106 mmol/L 101  101  100   CO2 20 - 29 mmol/L 22  26  26    Calcium 8.7 - 10.3 mg/dL 9.0  9.7  9.0   Total Protein 6.0 - 8.5 g/dL 6.7  7.2  6.9   Total Bilirubin 0.0 - 1.2 mg/dL 0.3  0.5  0.3   Alkaline Phos 44 - 121 IU/L 102  102  95   AST 0 - 40 IU/L 18  13  21    ALT 0 - 32 IU/L 20  18  22      Lipid Panel     Component Value Date/Time   CHOL 169 10/13/2021 0959   TRIG 150 (H) 10/13/2021 0959   HDL 33 (L) 10/13/2021 0959   CHOLHDL 4.1 04/15/2021 1014   LDLCALC 109 (H) 10/13/2021 0959    CBC    Component Value Date/Time   WBC 10.9 (H) 12/03/2020 1751   RBC 4.56 12/03/2020 1751   HGB 11.9 (L) 12/03/2020 1751   HGB 11.9 10/28/2011 0936   HCT 37.5 12/03/2020 1751   HCT 35.9 10/28/2011 0936   PLT 247 12/03/2020 1751   PLT 202 10/28/2011 0936   MCV 82.2 12/03/2020 1751   MCV 82.3 10/28/2011 0936   MCH 26.1 12/03/2020 1751   MCHC 31.7 12/03/2020 1751   RDW 14.0 12/03/2020 1751   RDW 15.1 (H) 10/28/2011 0936   LYMPHSABS 2.8 12/03/2020 1751   LYMPHSABS 1.8 10/28/2011 0936   MONOABS 0.8 12/03/2020 1751   MONOABS 0.5  10/28/2011 0936   EOSABS 0.1 12/03/2020 1751   EOSABS 0.1 10/28/2011 0936   BASOSABS 0.1 12/03/2020 1751   BASOSABS 0.0 10/28/2011 0936    Lab Results  Component Value Date   HGBA1C 6.6 04/15/2022    Lab Results  Component Value Date   TSH 0.886 10/13/2021    Assessment & Plan:  1. Controlled type 2 diabetes mellitus with microalbuminuria, with long-term current use of insulin (HCC) Controlled with A1c of 6.6 Continue current dose of Lantus, Ozempic Counseled on Diabetic diet, my plate method, 270 minutes of moderate intensity exercise/week Blood sugar logs with fasting goals of 80-120 mg/dl, random of less than 180 and in the event of sugars less than 60 mg/dl or greater than 400 mg/dl encouraged to notify the clinic. Advised on the need for annual eye exams, annual foot exams, Pneumonia vaccine. - POCT glucose (manual entry) - POCT glycosylated hemoglobin (Hb A1C)  2. Encounter for screening  mammogram for malignant neoplasm of breast - MM 3D SCREEN BREAST BILATERAL; Future  3. Hypertension associated with diabetes ([place]) Controlled Continue current regimen Counseled on blood pressure goal of less than 130/80, low-sodium, DASH diet, medication compliance, 150 minutes of moderate intensity exercise per week. Discussed medication compliance, adverse effects. - metoprolol succinate (TOPROL-XL) 50 MG 24 hr tablet; TAKE 1 TABLET (50 MG TOTAL) BY MOUTH DAILY.  Dispense: 90 tablet; Refill: 1 - lisinopril-hydrochlorothiazide (ZESTORETIC) 20-25 MG tablet; Take 1 tablet by mouth daily.  Dispense: 90 tablet; Refill: 1 - amLODipine (NORVASC) 10 MG tablet; TAKE 1 TABLET (10 MG TOTAL) BY MOUTH DAILY. FOR BLOOD PRESSURE  Dispense: 90 tablet; Refill: 1  4. Type 2 diabetes mellitus with diabetic polyneuropathy, with long-term current use of insulin (HCC) Neuropathy is controlled Continue gabapentin and Cymbalta. - gabapentin (NEURONTIN) 100 MG capsule; TAKE 1 CAPSULE (100 MG TOTAL) BY MOUTH  3 (THREE) TIMES DAILY.  Dispense: 90 capsule; Refill: 3 - DULoxetine (CYMBALTA) 60 MG capsule; Take 1 capsule (60 mg total) by mouth daily. For neuropathy  Dispense: 90 capsule; Refill: 1 - atorvastatin (LIPITOR) 20 MG tablet; Take 1 tablet (20 mg total) by mouth daily.  Dispense: 90 tablet; Refill: 1    Meds ordered this encounter  Medications   Semaglutide, 2 MG/DOSE, 8 MG/3ML SOPN    Sig: Inject 2 mg as directed once a week.    Dispense:  3 mL    Refill:  6   metoprolol succinate (TOPROL-XL) 50 MG 24 hr tablet    Sig: TAKE 1 TABLET (50 MG TOTAL) BY MOUTH DAILY.    Dispense:  90 tablet    Refill:  1   lisinopril-hydrochlorothiazide (ZESTORETIC) 20-25 MG tablet    Sig: Take 1 tablet by mouth daily.    Dispense:  90 tablet    Refill:  1   Insulin Glargine (BASAGLAR KWIKPEN) 100 UNIT/ML    Sig: Inject 35 Units into the skin daily.    Dispense:  30 mL    Refill:  6   gabapentin (NEURONTIN) 100 MG capsule    Sig: TAKE 1 CAPSULE (100 MG TOTAL) BY MOUTH 3 (THREE) TIMES DAILY.    Dispense:  90 capsule    Refill:  3   DULoxetine (CYMBALTA) 60 MG capsule    Sig: Take 1 capsule (60 mg total) by mouth daily. For neuropathy    Dispense:  90 capsule    Refill:  1   dapagliflozin propanediol (FARXIGA) 5 MG TABS tablet    Sig: Take 1 tablet (5 mg total) by mouth daily before breakfast.    Dispense:  90 tablet    Refill:  1   atorvastatin (LIPITOR) 20 MG tablet    Sig: Take 1 tablet (20 mg total) by mouth daily.    Dispense:  90 tablet    Refill:  1   amLODipine (NORVASC) 10 MG tablet    Sig: TAKE 1 TABLET (10 MG TOTAL) BY MOUTH DAILY. FOR BLOOD PRESSURE    Dispense:  90 tablet    Refill:  1    Follow-up: Return in about 6 months (around 10/14/2022) for Chronic medical conditions.       Charlott Rakes, MD, FAAFP. [facility] [place], [place]   04/16/2022, 10:04 AM

## 2022-04-16 ENCOUNTER — Other Ambulatory Visit: Payer: Self-pay

## 2022-04-16 ENCOUNTER — Encounter: Payer: Self-pay | Admitting: Family Medicine

## 2022-04-16 ENCOUNTER — Other Ambulatory Visit: Payer: Self-pay | Admitting: Family Medicine

## 2022-04-16 DIAGNOSIS — E039 Hypothyroidism, unspecified: Secondary | ICD-10-CM

## 2022-04-16 MED ORDER — LEVOTHYROXINE SODIUM 112 MCG PO TABS
112.0000 ug | ORAL_TABLET | Freq: Every day | ORAL | 6 refills | Status: DC
  Filled 2022-04-16 – 2022-05-11 (×2): qty 30, 30d supply, fill #0
  Filled 2022-06-04: qty 30, 30d supply, fill #1
  Filled 2022-07-16 (×2): qty 30, 30d supply, fill #2
  Filled 2022-08-12: qty 30, 30d supply, fill #3
  Filled 2022-09-29: qty 30, 30d supply, fill #4
  Filled 2022-10-13 – 2022-10-26 (×2): qty 30, 30d supply, fill #5
  Filled 2022-11-08 – 2022-11-22 (×4): qty 30, 30d supply, fill #6

## 2022-04-19 ENCOUNTER — Encounter: Payer: Self-pay | Admitting: Family Medicine

## 2022-04-30 ENCOUNTER — Other Ambulatory Visit: Payer: Self-pay

## 2022-05-11 ENCOUNTER — Other Ambulatory Visit: Payer: Self-pay

## 2022-05-12 ENCOUNTER — Other Ambulatory Visit: Payer: Self-pay

## 2022-05-21 ENCOUNTER — Other Ambulatory Visit: Payer: Self-pay

## 2022-05-21 MED ORDER — TIMOLOL MALEATE PF 0.5 % OP SOLN
1.0000 [drp] | Freq: Two times a day (BID) | OPHTHALMIC | 3 refills | Status: DC
  Filled 2022-05-21: qty 10, 30d supply, fill #0
  Filled 2022-06-15 – 2022-10-26 (×2): qty 60, 30d supply, fill #0

## 2022-05-21 MED ORDER — DORZOLAMIDE HCL 2 % OP SOLN
1.0000 [drp] | Freq: Two times a day (BID) | OPHTHALMIC | 1 refills | Status: DC
  Filled 2022-05-21: qty 10, 30d supply, fill #0

## 2022-05-24 ENCOUNTER — Other Ambulatory Visit: Payer: Self-pay

## 2022-05-26 ENCOUNTER — Other Ambulatory Visit: Payer: Self-pay

## 2022-05-28 ENCOUNTER — Other Ambulatory Visit: Payer: Self-pay

## 2022-05-31 ENCOUNTER — Other Ambulatory Visit: Payer: Self-pay

## 2022-06-02 ENCOUNTER — Other Ambulatory Visit: Payer: Self-pay

## 2022-06-04 ENCOUNTER — Other Ambulatory Visit: Payer: Self-pay

## 2022-06-07 ENCOUNTER — Other Ambulatory Visit: Payer: Self-pay

## 2022-06-09 ENCOUNTER — Other Ambulatory Visit: Payer: Self-pay

## 2022-06-15 ENCOUNTER — Other Ambulatory Visit: Payer: Self-pay

## 2022-07-05 ENCOUNTER — Other Ambulatory Visit: Payer: Self-pay

## 2022-07-15 ENCOUNTER — Ambulatory Visit
Admission: RE | Admit: 2022-07-15 | Discharge: 2022-07-15 | Disposition: A | Discharge status: Home / Self Care | Payer: Commercial Managed Care - HMO | Source: Ambulatory Visit | Attending: Family Medicine | Admitting: Family Medicine

## 2022-07-15 DIAGNOSIS — Z1231 Encounter for screening mammogram for malignant neoplasm of breast: Secondary | ICD-10-CM

## 2022-07-16 ENCOUNTER — Other Ambulatory Visit: Payer: Self-pay

## 2022-07-19 ENCOUNTER — Other Ambulatory Visit: Payer: Self-pay

## 2022-07-20 ENCOUNTER — Other Ambulatory Visit: Payer: Self-pay

## 2022-07-26 ENCOUNTER — Other Ambulatory Visit: Payer: Self-pay

## 2022-07-27 ENCOUNTER — Other Ambulatory Visit: Payer: Self-pay

## 2022-07-30 ENCOUNTER — Other Ambulatory Visit: Payer: Self-pay

## 2022-08-02 ENCOUNTER — Other Ambulatory Visit: Payer: Self-pay

## 2022-08-03 ENCOUNTER — Other Ambulatory Visit: Payer: Self-pay

## 2022-08-04 ENCOUNTER — Other Ambulatory Visit: Payer: Self-pay

## 2022-08-05 ENCOUNTER — Other Ambulatory Visit: Payer: Self-pay

## 2022-08-23 ENCOUNTER — Other Ambulatory Visit: Payer: Self-pay

## 2022-08-23 ENCOUNTER — Other Ambulatory Visit: Payer: Self-pay | Admitting: Family Medicine

## 2022-08-23 MED ORDER — INSULIN GLARGINE-YFGN 100 UNIT/ML ~~LOC~~ SOPN
35.0000 [IU] | PEN_INJECTOR | Freq: Every day | SUBCUTANEOUS | 1 refills | Status: DC
  Filled 2022-08-23 – 2022-09-03 (×2): qty 9, 25d supply, fill #0
  Filled 2022-09-29: qty 9, 25d supply, fill #1

## 2022-08-30 ENCOUNTER — Other Ambulatory Visit: Payer: Self-pay

## 2022-09-03 ENCOUNTER — Other Ambulatory Visit: Payer: Self-pay

## 2022-09-14 ENCOUNTER — Encounter (HOSPITAL_COMMUNITY): Payer: Self-pay

## 2022-09-14 ENCOUNTER — Other Ambulatory Visit: Payer: Self-pay

## 2022-09-14 ENCOUNTER — Ambulatory Visit (HOSPITAL_COMMUNITY)
Admission: EM | Admit: 2022-09-14 | Discharge: 2022-09-14 | Disposition: A | Discharge status: Home / Self Care | Payer: BLUE CROSS/BLUE SHIELD | Attending: Family Medicine | Admitting: Family Medicine

## 2022-09-14 DIAGNOSIS — J069 Acute upper respiratory infection, unspecified: Secondary | ICD-10-CM

## 2022-09-14 MED ORDER — BENZONATATE 200 MG PO CAPS
200.0000 mg | ORAL_CAPSULE | Freq: Two times a day (BID) | ORAL | 0 refills | Status: DC | PRN
  Filled 2022-09-14: qty 20, 10d supply, fill #0

## 2022-09-14 MED ORDER — HYDROCOD POLI-CHLORPHE POLI ER 10-8 MG/5ML PO SUER
5.0000 mL | Freq: Every evening | ORAL | 0 refills | Status: DC | PRN
  Filled 2022-09-14: qty 30, 6d supply, fill #0

## 2022-09-14 NOTE — Discharge Instructions (Signed)
You were seen today for upper respiratory symptoms.  This is likely viral at this time.  I have sent out a script for tessalon perles to use during the day and cough syrup for night time.  Please use over the counter claritin/zyrtec and flonase for your sinus symptoms as well.

## 2022-09-14 NOTE — ED Triage Notes (Signed)
Pt c/o cough, sinus drainage, and sore throat off and on for 5 days. States taking OTC cough syrup with relief.

## 2022-09-14 NOTE — ED Provider Notes (Signed)
[facility]    CSN: [id number] Arrival date & time: 09/14/22  [id number]      History   Chief Complaint Chief Complaint  Patient presents with   Cough    HPI Brenda [name] is a 62 y.o. female.   Patient is here for URI symptoms x 5 days.  Cough, sinus congestion, drainage, sore throat.  No fevers/chills.  No n/v.  No wheezing or sob.  She has been using otc robitussin without much help.  No home covid test.        Past Medical History:  Diagnosis Date   Arthritis    Colon cancer ([place]) 09/08/2009   Colonoscopy by Dr Collene Mares; Surgery by Dr Donne Hazel   Diabetes mellitus    Family history of breast cancer    aunt and cousin on father's side   Family history of colon cancer    grandmother - father's side   Family history of prostate cancer    uncle - (father's brother)   Fatigue    Headache(784.0)    Hemorrhoid    Hyperlipidemia    Hypertension    Neuropathy    feet   Night sweats    Thyroid disease    Wears glasses     Patient Active Problem List   Diagnosis Date Noted   Colon cancer ([place]) [id number]   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      Home Medications    Prior to Admission medications   Medication Sig Start Date End Date Taking? Authorizing Provider  amLODipine (NORVASC) 10 MG tablet TAKE 1 TABLET (10 MG TOTAL) BY MOUTH DAILY. FOR BLOOD PRESSURE 04/15/22   Charlott Rakes, MD  atorvastatin (LIPITOR) 20 MG tablet Take 1 tablet (20 mg total) by mouth daily. 04/15/22 04/15/23  Charlott Rakes,  MD  Blood Glucose Monitoring Suppl (TRUE METRIX METER) w/Device KIT Use 3 times daily to check blood sugars 06/21/19   Fulp, Cammie, MD  dapagliflozin propanediol (FARXIGA) 5 MG TABS tablet Take 1 tablet (5 mg total) by mouth daily before breakfast. 04/15/22   Charlott Rakes, MD  dorzolamide (TRUSOPT) 2 % ophthalmic solution Place 1 drop into the right eye 2 (two) times daily. 05/21/22     DULoxetine (CYMBALTA) 60 MG capsule Take 1 capsule (60 mg total) by mouth daily. For neuropathy 04/15/22   Charlott Rakes, MD  fluticasone (FLONASE) 50 MCG/ACT nasal spray Place 2 sprays into both nostrils daily. 10/23/20   Fenton Foy, NP  gabapentin (NEURONTIN) 100 MG capsule TAKE 1 CAPSULE (100 MG TOTAL) BY MOUTH 3 (THREE) TIMES DAILY. 04/15/22 04/15/23  Charlott Rakes, MD  glucose blood (TRUE METRIX BLOOD GLUCOSE TEST) test strip Use as instructed to check blood sugars 3 times daily 06/21/19   Fulp, Cammie, MD  insulin glargine-yfgn (SEMGLEE) 100 UNIT/ML Pen Inject 35 Units into the skin at bedtime. 08/23/22   Charlott Rakes, MD  Insulin Pen Needle 31G X 5 MM  MISC Use as directed. 12/11/21   Ladell Pier, MD  levothyroxine (SYNTHROID) 112 MCG tablet Take 1 tablet (112 mcg total) by mouth daily before breakfast. 04/16/22 04/16/23  Charlott Rakes, MD  lisinopril-hydrochlorothiazide (ZESTORETIC) 20-25 MG tablet Take 1 tablet by mouth daily. 04/15/22   Charlott Rakes, MD  metoprolol succinate (TOPROL-XL) 50 MG 24 hr tablet TAKE 1 TABLET (50 MG TOTAL) BY MOUTH DAILY. 04/15/22 04/15/23  Charlott Rakes, MD  Semaglutide, 2 MG/DOSE, 8 MG/3ML SOPN Inject 2 mg as directed once a week. 04/15/22   Charlott Rakes, MD  Timolol Maleate PF (TIMOPTIC OCUDOSE) 0.5 % SOLN Place 1 drop into the right eye 2 (two) times daily. 05/21/22     TRUEplus Lancets 28G MISC Use to help check blood sugars 3 times daily 06/21/19   Fulp, Cammie, MD  Insulin Glargine (BASAGLAR KWIKPEN) 100 UNIT/ML Inject 35 Units into the skin daily. 04/15/22  08/23/22  Charlott Rakes, MD    Family History Family History  Problem Relation Age of Onset   Colon polyps Mother    Diabetes Father    Heart disease Father    Hypertension Father    Kidney disease Father    Diabetes Sister    Breast cancer Paternal Aunt    Colon cancer Paternal Grandmother 54   Breast cancer Cousin    Breast cancer Cousin    Diabetes Brother    Esophageal cancer Neg Hx    Rectal cancer Neg Hx    Stomach cancer Neg Hx     Social History Social History   Tobacco Use   Smoking status: Former    Types: Cigarettes    Quit date: 04/05/1981    Years since quitting: 41.4   Smokeless tobacco: Never  Vaping Use   Vaping Use: Never used  Substance Use Topics   Alcohol use: No   Drug use: No     Allergies   Patient has no known allergies.   Review of Systems Review of Systems  Constitutional:  Positive for fatigue. Negative for chills and fever.  HENT:  Positive for congestion, rhinorrhea and sore throat.   Respiratory:  Positive for cough.   Gastrointestinal: Negative.   Musculoskeletal: Negative.      Physical Exam Triage Vital Signs ED Triage Vitals [09/14/22 1049]  Enc Vitals Group     BP (!) 160/78     Pulse Rate (!) 59     Resp 18     Temp 98.5 F (36.9 C)     Temp Source Oral     SpO2 97 %     Weight      Height      Head Circumference      Peak Flow      Pain Score 5     Pain Loc      Pain Edu?      Excl. in [place]?    No data found.  Updated Vital Signs BP (!) 160/78 (BP Location: Right Arm)   Pulse (!) 59   Temp 98.5 F (36.9 C) (Oral)   Resp 18   SpO2 97%   Visual Acuity Right Eye Distance:   Left Eye Distance:   Bilateral Distance:    Right Eye Near:   Left Eye Near:    Bilateral Near:     Physical Exam Constitutional:      General: She is not in acute distress.    Appearance: Normal appearance. She is not ill-appearing.  HENT:  Nose: Congestion and rhinorrhea present.     Mouth/Throat:     Mouth:  Mucous membranes are moist.     Pharynx: Posterior oropharyngeal erythema present. No oropharyngeal exudate.  Cardiovascular:     Rate and Rhythm: Normal rate and regular rhythm.  Pulmonary:     Effort: Pulmonary effort is normal.  Musculoskeletal:     Cervical back: Normal range of motion and neck supple. Tenderness present.  Lymphadenopathy:     Cervical: No cervical adenopathy.  Neurological:     General: No focal deficit present.     Mental Status: She is alert.  Psychiatric:        Mood and Affect: Mood normal.      UC Treatments / Results  Labs (all labs ordered are listed, but only abnormal results are displayed) Labs Reviewed - No data to display  EKG   Radiology No results found.  Procedures Procedures (including critical care time)  Medications Ordered in UC Medications - No data to display  Initial Impression / Assessment and Plan / UC Course  I have reviewed the triage vital signs and the nursing notes.  Pertinent labs & imaging results that were available during my care of the patient were reviewed by me and considered in my medical decision making (see chart for details).   Final Clinical Impressions(s) / UC Diagnoses   Final diagnoses:  Viral URI with cough     Discharge Instructions      You were seen today for upper respiratory symptoms.  This is likely viral at this time.  I have sent out a script for tessalon perles to use during the day and cough syrup for night time.  Please use over the counter claritin/zyrtec and flonase for your sinus symptoms as well.      ED Prescriptions     Medication Sig Dispense Auth. Provider   benzonatate (TESSALON) 200 MG capsule Take 1 capsule (200 mg total) by mouth 2 (two) times daily as needed for cough. 20 capsule Vincient Vanaman, MD   chlorpheniramine-HYDROcodone (TUSSIONEX) 10-8 MG/5ML Take 5 mLs by mouth at bedtime as needed for cough. 30 mL Rondel Oh, MD      PDMP not reviewed this  encounter.   Rondel Oh, MD 09/14/22 1116

## 2022-09-16 ENCOUNTER — Other Ambulatory Visit: Payer: Self-pay

## 2022-09-16 MED ORDER — OFLOXACIN 0.3 % OP SOLN
1.0000 [drp] | Freq: Four times a day (QID) | OPHTHALMIC | 1 refills | Status: DC
  Filled 2022-09-16: qty 5, 25d supply, fill #0

## 2022-09-16 MED ORDER — KETOROLAC TROMETHAMINE 0.5 % OP SOLN
1.0000 [drp] | Freq: Four times a day (QID) | OPHTHALMIC | 1 refills | Status: DC
  Filled 2022-09-16: qty 5, 25d supply, fill #0

## 2022-09-16 MED ORDER — PREDNISOLONE ACETATE 1 % OP SUSP
1.0000 [drp] | Freq: Four times a day (QID) | OPHTHALMIC | 1 refills | Status: DC
  Filled 2022-09-16: qty 10, 50d supply, fill #0

## 2022-09-24 ENCOUNTER — Other Ambulatory Visit: Payer: Self-pay

## 2022-10-01 ENCOUNTER — Other Ambulatory Visit: Payer: Self-pay

## 2022-10-13 ENCOUNTER — Other Ambulatory Visit: Payer: Self-pay

## 2022-10-14 ENCOUNTER — Ambulatory Visit: Payer: BLUE CROSS/BLUE SHIELD | Attending: Family Medicine | Admitting: Family Medicine

## 2022-10-14 ENCOUNTER — Other Ambulatory Visit: Payer: Self-pay

## 2022-10-14 ENCOUNTER — Encounter: Payer: Self-pay | Admitting: Family Medicine

## 2022-10-14 VITALS — BP 145/80 | HR 63 | Ht 68.0 in | Wt 229.6 lb

## 2022-10-14 DIAGNOSIS — E039 Hypothyroidism, unspecified: Secondary | ICD-10-CM

## 2022-10-14 DIAGNOSIS — E1159 Type 2 diabetes mellitus with other circulatory complications: Secondary | ICD-10-CM

## 2022-10-14 DIAGNOSIS — K582 Mixed irritable bowel syndrome: Secondary | ICD-10-CM

## 2022-10-14 DIAGNOSIS — E1142 Type 2 diabetes mellitus with diabetic polyneuropathy: Secondary | ICD-10-CM

## 2022-10-14 DIAGNOSIS — I152 Hypertension secondary to endocrine disorders: Secondary | ICD-10-CM

## 2022-10-14 DIAGNOSIS — R809 Proteinuria, unspecified: Secondary | ICD-10-CM

## 2022-10-14 DIAGNOSIS — Z794 Long term (current) use of insulin: Secondary | ICD-10-CM

## 2022-10-14 DIAGNOSIS — E1129 Type 2 diabetes mellitus with other diabetic kidney complication: Secondary | ICD-10-CM

## 2022-10-14 DIAGNOSIS — R140 Abdominal distension (gaseous): Secondary | ICD-10-CM

## 2022-10-14 LAB — POCT GLYCOSYLATED HEMOGLOBIN (HGB A1C): HbA1c, POC (controlled diabetic range): 6.6 % (ref 0.0–7.0)

## 2022-10-14 MED ORDER — INSULIN GLARGINE-YFGN 100 UNIT/ML ~~LOC~~ SOPN
35.0000 [IU] | PEN_INJECTOR | Freq: Every day | SUBCUTANEOUS | 1 refills | Status: DC
  Filled 2022-10-14 – 2022-11-08 (×2): qty 9, 25d supply, fill #0
  Filled 2022-12-03: qty 9, 25d supply, fill #1

## 2022-10-14 MED ORDER — DULOXETINE HCL 60 MG PO CPEP
60.0000 mg | ORAL_CAPSULE | Freq: Every day | ORAL | 1 refills | Status: DC
  Filled 2022-10-14 – 2022-11-08 (×2): qty 90, 90d supply, fill #0
  Filled 2023-02-02: qty 90, 90d supply, fill #1

## 2022-10-14 MED ORDER — AMLODIPINE BESYLATE 10 MG PO TABS
10.0000 mg | ORAL_TABLET | Freq: Every day | ORAL | 1 refills | Status: DC
  Filled 2022-11-08: qty 90, 90d supply, fill #0
  Filled 2023-02-23: qty 90, 90d supply, fill #1

## 2022-10-14 MED ORDER — METOPROLOL SUCCINATE ER 50 MG PO TB24
50.0000 mg | ORAL_TABLET | Freq: Every day | ORAL | 1 refills | Status: DC
  Filled 2022-10-14: qty 90, 90d supply, fill #0
  Filled 2022-12-03 – 2023-01-06 (×2): qty 90, 90d supply, fill #1

## 2022-10-14 MED ORDER — DAPAGLIFLOZIN PROPANEDIOL 5 MG PO TABS
5.0000 mg | ORAL_TABLET | Freq: Every day | ORAL | 1 refills | Status: DC
  Filled 2022-10-14: qty 90, 90d supply, fill #0
  Filled 2023-02-02: qty 90, 90d supply, fill #1

## 2022-10-14 MED ORDER — LISINOPRIL-HYDROCHLOROTHIAZIDE 20-25 MG PO TABS
1.0000 | ORAL_TABLET | Freq: Every day | ORAL | 1 refills | Status: DC
  Filled 2022-10-14 – 2022-11-08 (×2): qty 90, 90d supply, fill #0
  Filled 2023-02-02: qty 90, 90d supply, fill #1

## 2022-10-14 MED ORDER — ATORVASTATIN CALCIUM 20 MG PO TABS
20.0000 mg | ORAL_TABLET | Freq: Every day | ORAL | 1 refills | Status: DC
  Filled 2022-10-14: qty 90, 90d supply, fill #0
  Filled 2023-03-24: qty 90, 90d supply, fill #1

## 2022-10-14 MED ORDER — DICYCLOMINE HCL 10 MG PO CAPS
10.0000 mg | ORAL_CAPSULE | Freq: Two times a day (BID) | ORAL | 1 refills | Status: DC | PRN
  Filled 2022-10-14: qty 180, 90d supply, fill #0
  Filled 2023-03-24: qty 180, 90d supply, fill #1

## 2022-10-14 NOTE — Progress Notes (Signed)
Subjective:  Patient ID: Brenda [name], female    DOB: 12/18/1960  Age: 62 y.o. MRN: [id number]  CC: Diabetes   HPI Brenda [name] is a 62 y.o. year old female with a history of Type 2 Diabetes Mellitus (A1c 6.6), Hypertension, hypothyroidism, obesity.   Interval History:  She has had intermittent diarrhea and constipation preceded by a bloated feeling. Symptoms  are worse when she ingests beef. Symptoms occur right after meals with cramping and then diarrhea and it resolves after she moves bowels.  Sometimes has constipation but diarrhea symptoms are more predominant.  She also has reflux symptoms worse at night.  Tries to avoid eating late.  She has no hypoglycemia and is doing well on her current regimen of Wilder Glade, Semglee.  Neuropathy has improved on Cymbalta. Adherent with her antihypertensive and her levothyroxine.  She is also doing well on her statin. Past Medical History:  Diagnosis Date   Arthritis    Colon cancer ([facility]) 09/08/2009   Colonoscopy by Dr Collene Mares; Surgery by Dr Donne Hazel   Diabetes mellitus    Family history of breast cancer    aunt and cousin on father's side   Family history of colon cancer    grandmother - father's side   Family history of prostate cancer    uncle - (father's brother)   Fatigue    Headache(784.0)    Hemorrhoid    Hyperlipidemia    Hypertension    Neuropathy    feet   Night sweats    Thyroid disease    Wears glasses     Past Surgical History:  Procedure Laterality Date   ABDOMINAL HYSTERECTOMY     APPENDECTOMY     COLONOSCOPY  05/14/2011   Dr.Pyrtle   CYST REMOVED     BACK OF HEAD   RIGHT COLECTOMY  09/09/2009   FOR COLON CANCER   THYROID SURGERY  09/02/2010   Clarify with patient; BENIGN PER PT   TUBAL LIGATION      Family History  Problem Relation Age of Onset   Colon polyps Mother    Diabetes Father    Heart disease Father    Hypertension Father    Kidney disease Father    Diabetes Sister    Breast cancer  Paternal Aunt    Colon cancer Paternal Grandmother 62   Breast cancer Cousin    Breast cancer Cousin    Diabetes Brother    Esophageal cancer Neg Hx    Rectal cancer Neg Hx    Stomach cancer Neg Hx     Social History   Socioeconomic History   Marital status: Married    Spouse name: Not on file   Number of children: Not on file   Years of education: Not on file   Highest education level: Master's degree (e.g., MA, MS, MEng, MEd, MSW, MBA)  Occupational History   Not on file  Tobacco Use   Smoking status: Former    Types: Cigarettes    Quit date: 04/05/1981    Years since quitting: 41.5   Smokeless tobacco: Never  Vaping Use   Vaping Use: Never used  Substance and Sexual Activity   Alcohol use: No   Drug use: No   Sexual activity: Not Currently  Other Topics Concern   Not on file  Social History Narrative   Not on file   Social Determinants of Health   Financial Resource Strain: Medium Risk (10/13/2022)   Overall Financial Resource Strain (CARDIA)  Difficulty of Paying Living Expenses: Somewhat hard  Food Insecurity: No Food Insecurity (10/13/2022)   Hunger Vital Sign    Worried About Running Out of Food in the Last Year: Never true    Ran Out of Food in the Last Year: Never true  Transportation Needs: No Transportation Needs (10/13/2022)   PRAPARE - Hydrologist (Medical): No    Lack of Transportation (Non-Medical): No  Physical Activity: Sufficiently Active (10/13/2022)   Exercise Vital Sign    Days of Exercise per Week: 4 days    Minutes of Exercise per Session: 120 min  Stress: Stress Concern Present (10/13/2022)   [place]    Feeling of Stress : To some extent  Social Connections: Moderately Integrated (10/13/2022)   Social Connection and Isolation Panel [NHANES]    Frequency of Communication with Friends and Family: Once a week    Frequency of Social Gatherings  with Friends and Family: Once a week    Attends Religious Services: More than 4 times per year    Active Member of [employer] or Organizations: Yes    Attends Music therapist: More than 4 times per year    Marital Status: Married    No Known Allergies  Outpatient Medications Prior to Visit  Medication Sig Dispense Refill   Blood Glucose Monitoring Suppl (TRUE METRIX METER) w/Device KIT Use 3 times daily to check blood sugars 1 kit 0   dorzolamide (TRUSOPT) 2 % ophthalmic solution Place 1 drop into the right eye 2 (two) times daily. 10 mL 1   fluticasone (FLONASE) 50 MCG/ACT nasal spray Place 2 sprays into both nostrils daily. 16 g 0   gabapentin (NEURONTIN) 100 MG capsule TAKE 1 CAPSULE (100 MG TOTAL) BY MOUTH 3 (THREE) TIMES DAILY. 90 capsule 3   glucose blood (TRUE METRIX BLOOD GLUCOSE TEST) test strip Use as instructed to check blood sugars 3 times daily 100 each 12   Insulin Pen Needle 31G X 5 MM MISC Use as directed. 100 each 4   levothyroxine (SYNTHROID) 112 MCG tablet Take 1 tablet (112 mcg total) by mouth daily before breakfast. 30 tablet 6   Semaglutide, 2 MG/DOSE, 8 MG/3ML SOPN Inject 2 mg as directed once a week. 3 mL 6   Timolol Maleate PF (TIMOPTIC OCUDOSE) 0.5 % SOLN Place 1 drop into the right eye 2 (two) times daily. 60 each 3   TRUEplus Lancets 28G MISC Use to help check blood sugars 3 times daily 1100 each 11   amLODipine (NORVASC) 10 MG tablet TAKE 1 TABLET (10 MG TOTAL) BY MOUTH DAILY. FOR BLOOD PRESSURE 90 tablet 1   atorvastatin (LIPITOR) 20 MG tablet Take 1 tablet (20 mg total) by mouth daily. 90 tablet 1   dapagliflozin propanediol (FARXIGA) 5 MG TABS tablet Take 1 tablet (5 mg total) by mouth daily before breakfast. 90 tablet 1   DULoxetine (CYMBALTA) 60 MG capsule Take 1 capsule (60 mg total) by mouth daily. For neuropathy 90 capsule 1   insulin glargine-yfgn (SEMGLEE) 100 UNIT/ML Pen Inject 35 Units into the skin at bedtime. 9 mL 1    lisinopril-hydrochlorothiazide (ZESTORETIC) 20-25 MG tablet Take 1 tablet by mouth daily. 90 tablet 1   metoprolol succinate (TOPROL-XL) 50 MG 24 hr tablet TAKE 1 TABLET (50 MG TOTAL) BY MOUTH DAILY. 90 tablet 1   prednisoLONE acetate (PRED FORTE) 1 % ophthalmic suspension Place 1 drop into the left eye  4 (four) times daily after surgery. (Patient not taking: Reported on 10/14/2022) 10 mL 1   benzonatate (TESSALON) 200 MG capsule Take 1 capsule (200 mg total) by mouth 2 (two) times daily as needed for cough. (Patient not taking: Reported on 10/14/2022) 20 capsule 0   chlorpheniramine-HYDROcodone (TUSSIONEX) 10-8 MG/5ML Take 5 mLs by mouth at bedtime as needed for cough. (Patient not taking: Reported on 10/14/2022) 30 mL 0   ketorolac (ACULAR) 0.5 % ophthalmic solution Place 1 drop into the left eye 4 (four) times daily starting 1 day before surgery. (Patient not taking: Reported on 10/14/2022) 5 mL 1   ofloxacin (OCUFLOX) 0.3 % ophthalmic solution Place 1 drop into the left eye 4 (four) times daily starting 1 day before surgery. (Patient not taking: Reported on 10/14/2022) 5 mL 1   No facility-administered medications prior to visit.     ROS Review of Systems  Constitutional:  Negative for activity change and appetite change.  HENT:  Negative for sinus pressure and sore throat.   Respiratory:  Negative for chest tightness, shortness of breath and wheezing.   Cardiovascular:  Negative for chest pain and palpitations.  Gastrointestinal:  Negative for abdominal distention, abdominal pain and constipation.  Genitourinary: Negative.   Musculoskeletal: Negative.   Psychiatric/Behavioral:  Negative for behavioral problems and dysphoric mood.     Objective:  BP (!) 145/80   Pulse 63   Ht 5\' 8"  (1.727 m)   Wt 229 lb 9.6 oz (104.1 kg)   SpO2 98%   BMI 34.91 kg/m      10/14/2022   10:39 AM 10/14/2022   10:19 AM 09/14/2022   10:49 AM  BP/Weight  Systolic BP [id number]  Diastolic BP 80 77 78   Wt. (Lbs)  229.6   BMI  34.91 kg/m2       Physical Exam Constitutional:      Appearance: She is well-developed.  Cardiovascular:     Rate and Rhythm: Normal rate.     Heart sounds: Normal heart sounds. No murmur heard. Pulmonary:     Effort: Pulmonary effort is normal.     Breath sounds: Normal breath sounds. No wheezing or rales.  Chest:     Chest wall: No tenderness.  Abdominal:     General: Bowel sounds are normal. There is no distension.     Palpations: Abdomen is soft. There is no mass.     Tenderness: There is no abdominal tenderness.  Musculoskeletal:        General: Normal range of motion.     Right lower leg: No edema.     Left lower leg: No edema.  Neurological:     Mental Status: She is alert and oriented to person, place, and time.  Psychiatric:        Mood and Affect: Mood normal.        Latest Ref Rng & Units 10/13/2021    9:59 AM 04/15/2021   10:14 AM 12/03/2020    5:51 PM  CMP  Glucose 70 - 99 mg/dL 116  111  248   BUN 8 - 27 mg/dL 26  21  24    Creatinine 0.57 - 1.00 mg/dL 0.92  0.90  1.01   Sodium 134 - 144 mmol/L 142  140  135   Potassium 3.5 - 5.2 mmol/L 4.4  4.2  4.0   Chloride 96 - 106 mmol/L 101  101  100   CO2 20 - 29 mmol/L 22  26  26  Calcium 8.7 - 10.3 mg/dL 9.0  9.7  9.0   Total Protein 6.0 - 8.5 g/dL 6.7  7.2  6.9   Total Bilirubin 0.0 - 1.2 mg/dL 0.3  0.5  0.3   Alkaline Phos 44 - 121 IU/L 102  102  95   AST 0 - 40 IU/L 18  13  21    ALT 0 - 32 IU/L 20  18  22      Lipid Panel     Component Value Date/Time   CHOL 169 10/13/2021 0959   TRIG 150 (H) 10/13/2021 0959   HDL 33 (L) 10/13/2021 0959   CHOLHDL 4.1 04/15/2021 1014   LDLCALC 109 (H) 10/13/2021 0959    CBC    Component Value Date/Time   WBC 10.9 (H) 12/03/2020 1751   RBC 4.56 12/03/2020 1751   HGB 11.9 (L) 12/03/2020 1751   HGB 11.9 10/28/2011 0936   HCT 37.5 12/03/2020 1751   HCT 35.9 10/28/2011 0936   PLT 247 12/03/2020 1751   PLT 202 10/28/2011 0936   MCV  82.2 12/03/2020 1751   MCV 82.3 10/28/2011 0936   MCH 26.1 12/03/2020 1751   MCHC 31.7 12/03/2020 1751   RDW 14.0 12/03/2020 1751   RDW 15.1 (H) 10/28/2011 0936   LYMPHSABS 2.8 12/03/2020 1751   LYMPHSABS 1.8 10/28/2011 0936   MONOABS 0.8 12/03/2020 1751   MONOABS 0.5 10/28/2011 0936   EOSABS 0.1 12/03/2020 1751   EOSABS 0.1 10/28/2011 0936   BASOSABS 0.1 12/03/2020 1751   BASOSABS 0.0 10/28/2011 0936    Lab Results  Component Value Date   HGBA1C 6.6 10/14/2022   Lab Results  Component Value Date   TSH 0.886 10/13/2021     Assessment & Plan:  1. Controlled type 2 diabetes mellitus with microalbuminuria, with long-term current use of insulin (HCC) Controlled with A1c of 6.6 Continue current regimen Counseled on Diabetic diet, my plate method, [id number] minutes of moderate intensity exercise/week Blood sugar logs with fasting goals of 80-120 mg/dl, random of less than 180 and in the event of sugars less than 60 mg/dl or greater than 400 mg/dl encouraged to notify the clinic. Advised on the need for annual eye exams, annual foot exams, Pneumonia vaccine. - POCT glycosylated hemoglobin (Hb A1C) - Microalbumin / creatinine urine ratio - LP+Non-HDL Cholesterol - CMP14+EGFR - dapagliflozin propanediol (FARXIGA) 5 MG TABS tablet; Take 1 tablet (5 mg total) by mouth daily before breakfast.  Dispense: 90 tablet; Refill: 1 - insulin glargine-yfgn (SEMGLEE) 100 UNIT/ML Pen; Inject 35 Units into the skin at bedtime.  Dispense: 9 mL; Refill: 1  2. Hypothyroidism, post thyroidectomy Controlled Will send off thyroid labs and adjust levothyroxine dose accordingly - T4, free - TSH - T3  3. Hypertension associated with diabetes ([place]) Slightly above goal No regimen change today If blood pressure remains elevated at next visit consider switching from metoprolol to carvedilol. Counseled on blood pressure goal of less than 130/80, low-sodium, DASH diet, medication compliance, 150 minutes of  moderate intensity exercise per week. Discussed medication compliance, adverse effects. - amLODipine (NORVASC) 10 MG tablet; TAKE 1 TABLET (10 MG TOTAL) BY MOUTH DAILY. FOR BLOOD PRESSURE  Dispense: 90 tablet; Refill: 1 - lisinopril-hydrochlorothiazide (ZESTORETIC) 20-25 MG tablet; Take 1 tablet by mouth daily.  Dispense: 90 tablet; Refill: 1 - metoprolol succinate (TOPROL-XL) 50 MG 24 hr tablet; Take 1 tablet (50 mg total) by mouth daily.  Dispense: 90 tablet; Refill: 1  4. Type 2 diabetes mellitus with diabetic polyneuropathy,  with long-term current use of insulin (HCC) Neuropathy has improved on Cymbalta - atorvastatin (LIPITOR) 20 MG tablet; Take 1 tablet (20 mg total) by mouth daily.  Dispense: 90 tablet; Refill: 1 - DULoxetine (CYMBALTA) 60 MG capsule; Take 1 capsule (60 mg total) by mouth daily. For neuropathy  Dispense: 90 capsule; Refill: 1  5. Irritable bowel syndrome with both constipation and diarrhea Advised to use dicyclomine during periods of diarrhea and she can use a laxative when constipated - dicyclomine (BENTYL) 10 MG capsule; Take 1 capsule (10 mg total) by mouth 2 (two) times daily as needed for spasms.  Dispense: 180 capsule; Refill: 1  6. Abdominal bloating Could be IBS symptoms as well but will need to exclude H. pylori gastritis. - H. pylori breath test    Meds ordered this encounter  Medications   dicyclomine (BENTYL) 10 MG capsule    Sig: Take 1 capsule (10 mg total) by mouth 2 (two) times daily as needed for spasms.    Dispense:  180 capsule    Refill:  1   amLODipine (NORVASC) 10 MG tablet    Sig: TAKE 1 TABLET (10 MG TOTAL) BY MOUTH DAILY. FOR BLOOD PRESSURE    Dispense:  90 tablet    Refill:  1   atorvastatin (LIPITOR) 20 MG tablet    Sig: Take 1 tablet (20 mg total) by mouth daily.    Dispense:  90 tablet    Refill:  1   dapagliflozin propanediol (FARXIGA) 5 MG TABS tablet    Sig: Take 1 tablet (5 mg total) by mouth daily before breakfast.     Dispense:  90 tablet    Refill:  1   DULoxetine (CYMBALTA) 60 MG capsule    Sig: Take 1 capsule (60 mg total) by mouth daily. For neuropathy    Dispense:  90 capsule    Refill:  1   insulin glargine-yfgn (SEMGLEE) 100 UNIT/ML Pen    Sig: Inject 35 Units into the skin at bedtime.    Dispense:  9 mL    Refill:  1   lisinopril-hydrochlorothiazide (ZESTORETIC) 20-25 MG tablet    Sig: Take 1 tablet by mouth daily.    Dispense:  90 tablet    Refill:  1   metoprolol succinate (TOPROL-XL) 50 MG 24 hr tablet    Sig: Take 1 tablet (50 mg total) by mouth daily.    Dispense:  90 tablet    Refill:  1    Follow-up: Return in about 6 months (around 04/16/2023) for Chronic medical conditions.       Charlott Rakes, MD, FAAFP. [facility] and [place], [place]   10/14/2022, 12:23 PM

## 2022-10-14 NOTE — Progress Notes (Signed)
Abdominal pain with diarrhea and constipation.

## 2022-10-14 NOTE — Patient Instructions (Signed)
Diet for Irritable Bowel Syndrome When you have irritable bowel syndrome (IBS), it is very important to follow the eating habits that are best for your condition. IBS may cause various symptoms, such as pain in the abdomen, constipation, or diarrhea. Choosing the right foods can help to ease the discomfort from these symptoms. Work with your health care provider and dietitian to find the eating plan that will help to control your symptoms. What are tips for following this plan?  Keep a food diary. This will help you identify foods that cause symptoms. Write down: What you eat and when you eat it. What symptoms you have. When symptoms occur in relation to your meals, such as "pain in abdomen 2 hours after dinner." Eat your meals slowly and in a relaxed setting. Aim to eat 5-6 small meals per day. Do not skip meals. Drink enough fluid to keep your urine pale yellow. Ask your health care provider if you should take an over-the-counter probiotic to help restore healthy bacteria in your gut (digestive tract). Probiotics are foods that contain good bacteria and yeasts. Your dietitian may have specific dietary recommendations for you based on your symptoms. Your dietitian may recommend that you: Avoid foods that cause symptoms. Talk with your dietitian about other ways to get the same nutrients that are in those problem foods. Avoid foods with gluten. Gluten is a protein that is found in rye, wheat, and barley. Eat more foods that contain soluble fiber. Examples of foods with high soluble fiber include oats, seeds, and certain fruits and vegetables. Take a fiber supplement if told by your dietitian. Reduce or avoid certain foods called FODMAPs. These are foods that contain sugars that are hard for some people to digest. Ask your health care provider which foods to avoid. What foods should I avoid? The following are some foods and drinks that may make your symptoms worse: Fatty foods, such as french  fries. Foods that contain gluten, such as pasta and cereal. Dairy products, such as milk, cheese, and ice cream. Spicy foods. Alcohol. Products with caffeine, such as coffee, tea, or chocolate. Carbonated drinks, such as soda. Foods that are high in FODMAPs. These include certain fruits and vegetables. Products with sweeteners such as honey, high fructose corn syrup, sorbitol, and mannitol. The items listed above may not be a complete list of foods and beverages you should avoid. Contact a dietitian for more information. What foods are good sources of fiber? Your health care provider or dietitian may recommend that you eat more foods that contain fiber. Fiber can help to reduce constipation and other IBS symptoms. Add foods with fiber to your diet a little at a time so your body can get used to them. Too much fiber at one time might cause gas and swelling of your abdomen. The following are some foods that are good sources of fiber: Berries, such as raspberries, strawberries, and blueberries. Tomatoes. Carrots. Brown rice. Oats. Seeds, such as chia and pumpkin seeds. The items listed above may not be a complete list of recommended sources of fiber. Contact your dietitian for more options. Where to find more information International Foundation for Functional Gastrointestinal Disorders: aboutibs.org National Institute of Diabetes and Digestive and Kidney Diseases: niddk.nih.gov Summary When you have irritable bowel syndrome (IBS), it is very important to follow the eating habits that are best for your condition. IBS may cause various symptoms, such as pain in the abdomen, constipation, or diarrhea. Choosing the right foods can help to ease the   discomfort that comes from symptoms. Your health care provider or dietitian may recommend that you eat more foods that contain fiber. Keep a food diary. This will help you identify foods that cause symptoms. This information is not intended to replace  advice given to you by your health care provider. Make sure you discuss any questions you have with your health care provider. Document Revised: 06/16/2021 Document Reviewed: 06/16/2021 Elsevier Patient Education  2023 Elsevier Inc.  

## 2022-10-17 LAB — CMP14+EGFR
ALT: 21 IU/L (ref 0–32)
AST: 20 IU/L (ref 0–40)
Albumin/Globulin Ratio: 1.6 (ref 1.2–2.2)
Albumin: 4.2 g/dL (ref 3.9–4.9)
Alkaline Phosphatase: 110 IU/L (ref 44–121)
BUN/Creatinine Ratio: 24 (ref 12–28)
BUN: 23 mg/dL (ref 8–27)
Bilirubin Total: 0.5 mg/dL (ref 0.0–1.2)
CO2: 25 mmol/L (ref 20–29)
Calcium: 9.3 mg/dL (ref 8.7–10.3)
Chloride: 99 mmol/L (ref 96–106)
Creatinine, Ser: 0.94 mg/dL (ref 0.57–1.00)
Globulin, Total: 2.7 g/dL (ref 1.5–4.5)
Glucose: 105 mg/dL — ABNORMAL HIGH (ref 70–99)
Potassium: 3.7 mmol/L (ref 3.5–5.2)
Sodium: 140 mmol/L (ref 134–144)
Total Protein: 6.9 g/dL (ref 6.0–8.5)
eGFR: 68 mL/min/{1.73_m2} (ref >59)

## 2022-10-17 LAB — LP+NON-HDL CHOLESTEROL
Cholesterol, Total: 145 mg/dL (ref 100–199)
HDL: 41 mg/dL (ref >39)
LDL Chol Calc (NIH): 86 mg/dL (ref 0–99)
Total Non-HDL-Chol (LDL+VLDL): 104 mg/dL (ref 0–129)
Triglycerides: 99 mg/dL (ref 0–149)
VLDL Cholesterol Cal: 18 mg/dL (ref 5–40)

## 2022-10-17 LAB — T3: T3, Total: 81 ng/dL (ref 71–180)

## 2022-10-17 LAB — T4, FREE: Free T4: 1.65 ng/dL (ref 0.82–1.77)

## 2022-10-17 LAB — MICROALBUMIN / CREATININE URINE RATIO
Creatinine, Urine: 132.6 mg/dL
Microalb/Creat Ratio: 57 mg/g creat — ABNORMAL HIGH (ref 0–29)
Microalbumin, Urine: 75.6 ug/mL

## 2022-10-17 LAB — H. PYLORI BREATH TEST: H pylori Breath Test: NEGATIVE

## 2022-10-17 LAB — TSH: TSH: 0.792 u[IU]/mL (ref 0.450–4.500)

## 2022-10-26 ENCOUNTER — Other Ambulatory Visit: Payer: Self-pay

## 2022-11-08 ENCOUNTER — Other Ambulatory Visit: Payer: Self-pay

## 2022-11-11 ENCOUNTER — Other Ambulatory Visit: Payer: Self-pay

## 2022-11-19 ENCOUNTER — Other Ambulatory Visit: Payer: Self-pay

## 2022-11-26 ENCOUNTER — Other Ambulatory Visit: Payer: Self-pay

## 2022-11-27 ENCOUNTER — Encounter (HOSPITAL_COMMUNITY): Payer: Self-pay

## 2022-11-27 ENCOUNTER — Ambulatory Visit (HOSPITAL_COMMUNITY)
Admission: EM | Admit: 2022-11-27 | Discharge: 2022-11-27 | Disposition: A | Discharge status: Home / Self Care | Payer: BLUE CROSS/BLUE SHIELD | Attending: Emergency Medicine | Admitting: Emergency Medicine

## 2022-11-27 DIAGNOSIS — J069 Acute upper respiratory infection, unspecified: Secondary | ICD-10-CM | POA: Insufficient documentation

## 2022-11-27 DIAGNOSIS — Z1152 Encounter for screening for COVID-19: Secondary | ICD-10-CM | POA: Insufficient documentation

## 2022-11-27 DIAGNOSIS — B9789 Other viral agents as the cause of diseases classified elsewhere: Secondary | ICD-10-CM | POA: Diagnosis not present

## 2022-11-27 MED ORDER — GUAIFENESIN ER 600 MG PO TB12: 1200.0000 mg | ORAL_TABLET | Freq: Two times a day (BID) | ORAL | 0 refills | Status: AC

## 2022-11-27 MED ORDER — PROMETHAZINE-DM 6.25-15 MG/5ML PO SYRP: 5.0000 mL | ORAL_SOLUTION | Freq: Four times a day (QID) | ORAL | 0 refills | Status: DC | PRN

## 2022-11-27 NOTE — ED Triage Notes (Signed)
Patient here today with c/o cough and chest congestion since last night. She has been having some SOB, fever, chills, ST, and runny nose. She has been taking Dayquil which helps some. No sick contacts and no recent travel.

## 2022-11-27 NOTE — Discharge Instructions (Addendum)
Your symptoms are consistent with a viral upper respiratory tract infection.  You can take the cough syrup up to 4 times daily as needed for cough, do not drink or drive on this medication as it may make you drowsy.  For your nasal congestion I suggest doing 1200 mg of Mucinex daily, please ensure you are drinking at least 64 ounces of water as well to help loosen your secretions.  You can consider sleeping with a humidifier, tea with warm honey and warm saline gargles for your sore throat.  We have tested you for COVID-19 and we will contact you if positive.  Either way, please continue symptomatic management at home.  You can alternate between Tylenol and ibuprofen every 4-6 hours for fever, body aches and discomforts.  Please return to clinic if her symptoms do not improve over the next week, or you develop any new or concerning symptoms.

## 2022-11-27 NOTE — ED Provider Notes (Signed)
[facility]    CSN: [id number] Arrival date & time: 11/27/22  1146      History   Chief Complaint Chief Complaint  Patient presents with   Cough    HPI Brenda [name] is a 62 y.o. female.   Patient presents to clinic for a nonproductive cough, shortness of breath with her coughing fits, subjective fever, chills, sore throat and nasal congestion since yesterday.  She did take DayQuil yesterday which helped her symptoms a little.  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  She denies any chest pain, current shortness of breath or wheezing.     The history is provided by the patient and medical records.  Cough Associated symptoms: chills, rhinorrhea and sore throat   Associated symptoms: no chest pain, no shortness of breath and no wheezing     Past Medical History:  Diagnosis Date   Arthritis    Colon cancer (HCC) 09/08/2009   Colonoscopy by Dr Loreta Ave; Surgery by Dr Dwain Sarna   Diabetes mellitus    Family history of breast cancer    aunt and cousin on father's side   Family history of colon cancer    grandmother - father's side   Family history of prostate cancer    uncle - (father's brother)   Fatigue    Headache(784.0)    Hemorrhoid    Hyperlipidemia    Hypertension    Neuropathy    feet   Night sweats    Thyroid disease    Wears glasses     Patient Active Problem List   Diagnosis Date Noted   Colon cancer (HCC) 05/01/2012   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      Home Medications    Prior to Admission medications   Medication Sig Start Date End Date Taking? Authorizing Provider  amLODipine (NORVASC) 10 MG tablet TAKE 1 TABLET (10 MG TOTAL) BY MOUTH DAILY. FOR BLOOD PRESSURE 10/14/22  Yes Hoy Register, MD  atorvastatin (LIPITOR) 20 MG tablet Take 1 tablet (20 mg total) by mouth daily. 10/14/22  Yes Hoy Register, MD  dapagliflozin propanediol (FARXIGA) 5 MG TABS tablet Take 1 tablet (5 mg total) by mouth daily before breakfast. 10/14/22  Yes Newlin, Enobong, MD  dicyclomine (BENTYL) 10 MG capsule Take 1 capsule (10 mg total) by mouth 2 (two) times daily as needed for spasms. 10/14/22  Yes Hoy Register, MD  DULoxetine (CYMBALTA) 60 MG capsule Take 1 capsule (60 mg total) by mouth daily. For neuropathy 10/14/22  Yes Newlin, Odette Horns, MD  fluticasone (FLONASE) 50 MCG/ACT nasal spray Place 2 sprays into both nostrils daily. 10/23/20  Yes Ivonne Andrew, NP  gabapentin (NEURONTIN) 100 MG capsule TAKE 1 CAPSULE (100 MG TOTAL) BY MOUTH 3 (THREE) TIMES DAILY. 04/15/22 04/15/23 Yes Hoy Register, MD  guaiFENesin (MUCINEX) 600 MG 12 hr tablet Take 2  tablets (1,200 mg total) by mouth 2 (two) times daily for 5 days. 11/27/22 12/02/22 Yes Rinaldo Ratel, [place] N, FNP  insulin glargine-yfgn (SEMGLEE) 100 UNIT/ML Pen Inject 35 Units into the skin at bedtime. 10/14/22  Yes Hoy Register, MD  levothyroxine (SYNTHROID) 112 MCG tablet Take 1 tablet (112 mcg total) by mouth daily before breakfast. 04/16/22 04/16/23 Yes Newlin, Enobong, MD  lisinopril-hydrochlorothiazide (ZESTORETIC) 20-25 MG tablet Take 1 tablet by mouth daily. 10/14/22  Yes Hoy Register, MD  metoprolol succinate (TOPROL-XL) 50 MG 24 hr tablet Take 1 tablet (50 mg total) by mouth daily. 10/14/22  Yes Hoy Register, MD  promethazine-dextromethorphan (PROMETHAZINE-DM) 6.25-15 MG/5ML syrup Take 5 mLs by mouth 4 (four) times daily as needed for cough. 11/27/22  Yes Rinaldo Ratel,  [place] N, FNP  Semaglutide, 2 MG/DOSE, 8 MG/3ML SOPN Inject 2 mg as directed once a week. 04/15/22  Yes Hoy Register, MD  Blood Glucose Monitoring Suppl (TRUE METRIX METER) w/Device KIT Use 3 times daily to check blood sugars 06/21/19   Fulp, Cammie, MD  glucose blood (TRUE METRIX BLOOD GLUCOSE TEST) test strip Use as instructed to check blood sugars 3 times daily 06/21/19   Fulp, Cammie, MD  Insulin Pen Needle 31G X 5 MM MISC Use as directed. 12/11/21   Marcine Matar, MD  TRUEplus Lancets 28G MISC Use to help check blood sugars 3 times daily 06/21/19   Fulp, Cammie, MD  Insulin Glargine (BASAGLAR KWIKPEN) 100 UNIT/ML Inject 35 Units into the skin daily. 04/15/22 08/23/22  Hoy Register, MD    Family History Family History  Problem Relation Age of Onset   Colon polyps Mother    Diabetes Father    Heart disease Father    Hypertension Father    Kidney disease Father    Diabetes Sister    Breast cancer Paternal Aunt    Colon cancer Paternal Grandmother 51   Breast cancer Cousin    Breast cancer Cousin    Diabetes Brother    Esophageal cancer Neg Hx    Rectal cancer Neg Hx    Stomach cancer Neg Hx     Social History Social History   Tobacco Use   Smoking status: Former    Types: Cigarettes    Quit date: 04/05/1981    Years since quitting: 41.6   Smokeless tobacco: Never  Vaping Use   Vaping Use: Never used  Substance Use Topics   Alcohol use: No   Drug use: No     Allergies   Patient has no known allergies.   Review of Systems Review of Systems  Constitutional:  Positive for chills.  HENT:  Positive for congestion, postnasal drip, rhinorrhea and sore throat. Negative for trouble swallowing and voice change.   Respiratory:  Positive for cough. Negative for shortness of breath and wheezing.   Cardiovascular:  Negative for chest pain.  Gastrointestinal:  Negative for abdominal pain.  Musculoskeletal:  Positive for arthralgias.     Physical Exam Triage Vital  Signs ED Triage Vitals  Enc Vitals Group     BP 11/27/22 1221 123/68     Pulse Rate 11/27/22 1221 60     Resp 11/27/22 1221 16     Temp 11/27/22 1221 98.5 F (36.9 C)     Temp Source 11/27/22 1221 Oral     SpO2 11/27/22 1221 97 %     Weight 11/27/22 1221 238 lb (108 kg)     Height 11/27/22 1221 5' 8.5" (1.74 m)  Head Circumference --      Peak Flow --      Pain Score 11/27/22 1220 7     Pain Loc --      Pain Edu? --      Excl. in GC? --    No data found.  Updated Vital Signs BP 123/68 (BP Location: Left Arm)   Pulse 60   Temp 98.5 F (36.9 C) (Oral)   Resp 16   Ht 5' 8.5" (1.74 m)   Wt 238 lb (108 kg)   SpO2 97%   BMI 35.66 kg/m   Visual Acuity Right Eye Distance:   Left Eye Distance:   Bilateral Distance:    Right Eye Near:   Left Eye Near:    Bilateral Near:     Physical Exam Vitals and nursing note reviewed.  Constitutional:      Appearance: Normal appearance.  HENT:     Head: Normocephalic and atraumatic.     Right Ear: External ear normal.     Left Ear: External ear normal.     Nose: Congestion and rhinorrhea present.     Mouth/Throat:     Mouth: Mucous membranes are moist.     Pharynx: Posterior oropharyngeal erythema present.  Cardiovascular:     Rate and Rhythm: Normal rate and regular rhythm.     Heart sounds: Normal heart sounds. No murmur heard. Pulmonary:     Effort: Pulmonary effort is normal. No respiratory distress.     Breath sounds: Normal breath sounds.  Musculoskeletal:     Cervical back: Normal range of motion and neck supple.  Lymphadenopathy:     Cervical: Cervical adenopathy present.  Skin:    General: Skin is warm and dry.  Neurological:     General: No focal deficit present.     Mental Status: She is alert and oriented to person, place, and time.  Psychiatric:        Mood and Affect: Mood normal.        Behavior: Behavior normal.      UC Treatments / Results  Labs (all labs ordered are listed, but only abnormal  results are displayed) Labs Reviewed  SARS CORONAVIRUS 2 (TAT 6-24 HRS)    EKG   Radiology No results found.  Procedures Procedures (including critical care time)  Medications Ordered in UC Medications - No data to display  Initial Impression / Assessment and Plan / UC Course  I have reviewed the triage vital signs and the nursing notes.  Pertinent labs & imaging results that were available during my care of the patient were reviewed by me and considered in my medical decision making (see chart for details).  Vitals and triage reviewed, patient is hemodynamically stable.  Symptoms consistent with viral upper respiratory tract infection, will treat symptomatically with cough syrup and decongestions.  COVID-19 testing obtained, staff will contact if positive, continue symptomatic management.  Symptomatic management discussed, plan of care, follow-up and return precautions reviewed, no questions at this time.    Final Clinical Impressions(s) / UC Diagnoses   Final diagnoses:  Viral URI with cough     Discharge Instructions      Your symptoms are consistent with a viral upper respiratory tract infection.  You can take the cough syrup up to 4 times daily as needed for cough, do not drink or drive on this medication as it may make you drowsy.  For your nasal congestion I suggest doing 1200 mg of Mucinex daily, please ensure  you are drinking at least 64 ounces of water as well to help loosen your secretions.  You can consider sleeping with a humidifier, tea with warm honey and warm saline gargles for your sore throat.  We have tested you for COVID-19 and we will contact you if positive.  Either way, please continue symptomatic management at home.  You can alternate between Tylenol and ibuprofen every 4-6 hours for fever, body aches and discomforts.  Please return to clinic if her symptoms do not improve over the next week, or you develop any new or concerning symptoms.      ED  Prescriptions     Medication Sig Dispense Auth. Provider   guaiFENesin (MUCINEX) 600 MG 12 hr tablet Take 2 tablets (1,200 mg total) by mouth 2 (two) times daily for 5 days. 20 tablet Rinaldo Ratel, [place] N, [place]   promethazine-dextromethorphan (PROMETHAZINE-DM) 6.25-15 MG/5ML syrup Take 5 mLs by mouth 4 (four) times daily as needed for cough. 118 mL Selinda Korzeniewski, [place] N, [place]      PDMP not reviewed this encounter.   Elliot Simoneaux, [place] N, [place] 11/27/22 1248

## 2022-11-28 LAB — SARS CORONAVIRUS 2 (TAT 6-24 HRS): SARS Coronavirus 2: NEGATIVE

## 2022-12-03 ENCOUNTER — Other Ambulatory Visit: Payer: Self-pay

## 2022-12-03 ENCOUNTER — Encounter: Payer: Self-pay | Admitting: Family Medicine

## 2022-12-03 ENCOUNTER — Other Ambulatory Visit: Payer: Self-pay | Admitting: Family Medicine

## 2022-12-03 MED ORDER — OZEMPIC (2 MG/DOSE) 8 MG/3ML ~~LOC~~ SOPN
2.0000 mg | PEN_INJECTOR | SUBCUTANEOUS | 6 refills | Status: DC
  Filled 2022-12-03 – 2022-12-09 (×2): qty 3, 28d supply, fill #0
  Filled 2023-01-06: qty 3, 28d supply, fill #1
  Filled 2023-02-02: qty 3, 28d supply, fill #2
  Filled 2023-02-23: qty 3, 28d supply, fill #3
  Filled 2023-03-24 – 2023-03-25 (×2): qty 3, 28d supply, fill #4

## 2022-12-06 ENCOUNTER — Other Ambulatory Visit: Payer: Self-pay

## 2022-12-09 ENCOUNTER — Other Ambulatory Visit: Payer: Self-pay

## 2022-12-10 ENCOUNTER — Other Ambulatory Visit: Payer: Self-pay

## 2022-12-28 ENCOUNTER — Other Ambulatory Visit: Payer: Self-pay

## 2022-12-28 ENCOUNTER — Other Ambulatory Visit: Payer: Self-pay | Admitting: Family Medicine

## 2022-12-28 DIAGNOSIS — E039 Hypothyroidism, unspecified: Secondary | ICD-10-CM

## 2022-12-28 DIAGNOSIS — E1142 Type 2 diabetes mellitus with diabetic polyneuropathy: Secondary | ICD-10-CM

## 2022-12-28 DIAGNOSIS — E1129 Type 2 diabetes mellitus with other diabetic kidney complication: Secondary | ICD-10-CM

## 2022-12-28 MED ORDER — INSULIN GLARGINE-YFGN 100 UNIT/ML ~~LOC~~ SOPN
35.0000 [IU] | PEN_INJECTOR | Freq: Every day | SUBCUTANEOUS | 1 refills | Status: DC
Start: 2022-12-28 — End: 2023-02-23
  Filled 2022-12-28 – 2023-01-06 (×2): qty 9, 25d supply, fill #0
  Filled 2023-02-02: qty 9, 25d supply, fill #1

## 2022-12-28 MED ORDER — GABAPENTIN 100 MG PO CAPS
100.0000 mg | ORAL_CAPSULE | Freq: Three times a day (TID) | ORAL | 3 refills | Status: DC
  Filled 2022-12-28 – 2023-02-02 (×2): qty 90, 30d supply, fill #0
  Filled 2023-03-24: qty 90, 30d supply, fill #1
  Filled 2023-04-29 – 2023-05-19 (×2): qty 90, 30d supply, fill #2
  Filled 2023-07-22 – 2023-08-25 (×2): qty 90, 30d supply, fill #3

## 2022-12-28 MED ORDER — LEVOTHYROXINE SODIUM 112 MCG PO TABS
112.0000 ug | ORAL_TABLET | Freq: Every day | ORAL | 6 refills | Status: DC
Start: 2022-12-28 — End: 2023-11-16
  Filled 2022-12-28 – 2023-01-06 (×2): qty 30, 30d supply, fill #0
  Filled 2023-02-02: qty 30, 30d supply, fill #1
  Filled 2023-03-24: qty 30, 30d supply, fill #2
  Filled 2023-04-29 – 2023-05-19 (×2): qty 30, 30d supply, fill #3
  Filled 2023-06-24: qty 30, 30d supply, fill #4
  Filled 2023-07-22 – 2023-08-25 (×2): qty 30, 30d supply, fill #5
  Filled 2023-10-12: qty 30, 30d supply, fill #6

## 2022-12-29 ENCOUNTER — Other Ambulatory Visit: Payer: Self-pay

## 2023-01-04 ENCOUNTER — Other Ambulatory Visit: Payer: Self-pay

## 2023-01-06 ENCOUNTER — Other Ambulatory Visit: Payer: Self-pay

## 2023-01-06 ENCOUNTER — Other Ambulatory Visit: Payer: Self-pay | Admitting: Internal Medicine

## 2023-01-06 ENCOUNTER — Encounter: Payer: Self-pay | Admitting: Family Medicine

## 2023-01-06 MED ORDER — TECHLITE PEN NEEDLES 31G X 5 MM MISC
0 refills | Status: DC
  Filled 2023-01-06: qty 100, 90d supply, fill #0

## 2023-02-02 ENCOUNTER — Other Ambulatory Visit: Payer: Self-pay

## 2023-02-23 ENCOUNTER — Other Ambulatory Visit: Payer: Self-pay

## 2023-02-23 ENCOUNTER — Other Ambulatory Visit: Payer: Self-pay | Admitting: Family Medicine

## 2023-02-23 DIAGNOSIS — Z794 Long term (current) use of insulin: Secondary | ICD-10-CM

## 2023-02-23 DIAGNOSIS — I152 Hypertension secondary to endocrine disorders: Secondary | ICD-10-CM

## 2023-02-23 MED ORDER — INSULIN GLARGINE-YFGN 100 UNIT/ML ~~LOC~~ SOPN
35.0000 [IU] | PEN_INJECTOR | Freq: Every day | SUBCUTANEOUS | 2 refills | Status: AC
Start: 2023-02-23 — End: ?
  Filled 2023-02-23: qty 9, 25d supply, fill #0
  Filled 2023-03-24: qty 9, 25d supply, fill #1

## 2023-02-23 MED ORDER — DAPAGLIFLOZIN PROPANEDIOL 5 MG PO TABS
5.0000 mg | ORAL_TABLET | Freq: Every day | ORAL | 0 refills | Status: AC
Start: 2023-02-23 — End: ?
  Filled 2023-02-23 – 2023-03-25 (×4): qty 90, 90d supply, fill #0

## 2023-02-23 MED ORDER — METOPROLOL SUCCINATE ER 50 MG PO TB24
50.0000 mg | ORAL_TABLET | Freq: Every day | ORAL | 0 refills | Status: AC
Start: 2023-02-23 — End: ?
  Filled 2023-02-23: qty 90, 90d supply, fill #0

## 2023-02-23 MED ORDER — LISINOPRIL-HYDROCHLOROTHIAZIDE 20-25 MG PO TABS
1.0000 | ORAL_TABLET | Freq: Every day | ORAL | 0 refills | Status: AC
Start: 2023-02-23 — End: ?
  Filled 2023-02-23: qty 90, 90d supply, fill #0

## 2023-02-24 ENCOUNTER — Other Ambulatory Visit: Payer: Self-pay

## 2023-02-25 ENCOUNTER — Other Ambulatory Visit: Payer: Self-pay

## 2023-03-04 ENCOUNTER — Other Ambulatory Visit: Payer: Self-pay

## 2023-03-24 ENCOUNTER — Other Ambulatory Visit: Payer: Self-pay

## 2023-03-25 ENCOUNTER — Other Ambulatory Visit: Payer: Self-pay

## 2023-04-20 ENCOUNTER — Other Ambulatory Visit: Payer: Self-pay

## 2023-04-20 ENCOUNTER — Ambulatory Visit: Payer: BLUE CROSS/BLUE SHIELD | Attending: Family Medicine | Admitting: Family Medicine

## 2023-04-20 VITALS — BP 136/75 | HR 59 | Ht 68.5 in | Wt 239.6 lb

## 2023-04-20 DIAGNOSIS — R809 Proteinuria, unspecified: Secondary | ICD-10-CM

## 2023-04-20 DIAGNOSIS — K582 Mixed irritable bowel syndrome: Secondary | ICD-10-CM

## 2023-04-20 DIAGNOSIS — E1142 Type 2 diabetes mellitus with diabetic polyneuropathy: Secondary | ICD-10-CM | POA: Diagnosis not present

## 2023-04-20 DIAGNOSIS — Z794 Long term (current) use of insulin: Secondary | ICD-10-CM | POA: Diagnosis not present

## 2023-04-20 DIAGNOSIS — R4701 Aphasia: Secondary | ICD-10-CM | POA: Diagnosis not present

## 2023-04-20 DIAGNOSIS — E1129 Type 2 diabetes mellitus with other diabetic kidney complication: Secondary | ICD-10-CM

## 2023-04-20 DIAGNOSIS — E1159 Type 2 diabetes mellitus with other circulatory complications: Secondary | ICD-10-CM

## 2023-04-20 DIAGNOSIS — Z7984 Long term (current) use of oral hypoglycemic drugs: Secondary | ICD-10-CM

## 2023-04-20 DIAGNOSIS — I152 Hypertension secondary to endocrine disorders: Secondary | ICD-10-CM

## 2023-04-20 DIAGNOSIS — Z7985 Long-term (current) use of injectable non-insulin antidiabetic drugs: Secondary | ICD-10-CM

## 2023-04-20 LAB — POCT GLYCOSYLATED HEMOGLOBIN (HGB A1C): HbA1c, POC (controlled diabetic range): 7.3 % — AB (ref 0.0–7.0)

## 2023-04-20 MED ORDER — LISINOPRIL-HYDROCHLOROTHIAZIDE 20-25 MG PO TABS
1.0000 | ORAL_TABLET | Freq: Every day | ORAL | 0 refills | Status: DC
  Filled 2023-04-20 – 2023-05-19 (×2): qty 90, 90d supply, fill #0

## 2023-04-20 MED ORDER — METOPROLOL SUCCINATE ER 50 MG PO TB24
50.0000 mg | ORAL_TABLET | Freq: Every day | ORAL | 0 refills | Status: DC
Start: 2023-04-20 — End: 2023-07-22
  Filled 2023-04-20: qty 90, 90d supply, fill #0

## 2023-04-20 MED ORDER — DULOXETINE HCL 60 MG PO CPEP
60.0000 mg | ORAL_CAPSULE | Freq: Every day | ORAL | 1 refills | Status: DC
  Filled 2023-04-20 – 2023-05-19 (×2): qty 90, 90d supply, fill #0
  Filled 2023-08-25: qty 90, 90d supply, fill #1

## 2023-04-20 MED ORDER — DICYCLOMINE HCL 10 MG PO CAPS
10.0000 mg | ORAL_CAPSULE | Freq: Two times a day (BID) | ORAL | 1 refills | Status: DC | PRN
  Filled 2023-04-20 – 2023-08-25 (×3): qty 180, 90d supply, fill #0

## 2023-04-20 MED ORDER — DAPAGLIFLOZIN PROPANEDIOL 5 MG PO TABS
5.0000 mg | ORAL_TABLET | Freq: Every day | ORAL | 0 refills | Status: DC
Start: 2023-04-20 — End: 2024-01-18
  Filled 2023-04-20 – 2023-07-22 (×4): qty 90, 90d supply, fill #0

## 2023-04-20 MED ORDER — OZEMPIC (2 MG/DOSE) 8 MG/3ML ~~LOC~~ SOPN
2.0000 mg | PEN_INJECTOR | SUBCUTANEOUS | 6 refills | Status: DC
Start: 2023-04-20 — End: 2024-06-12
  Filled 2023-04-20 – 2023-04-25 (×2): qty 3, 28d supply, fill #0
  Filled 2023-05-19 – 2023-05-20 (×2): qty 3, 28d supply, fill #1
  Filled 2023-06-24: qty 3, 28d supply, fill #2
  Filled 2023-07-14 – 2023-07-15 (×2): qty 3, 28d supply, fill #3
  Filled 2023-08-25: qty 3, 28d supply, fill #4
  Filled 2023-09-30: qty 3, 28d supply, fill #5
  Filled 2023-10-31 – 2023-12-07 (×5): qty 3, 28d supply, fill #6

## 2023-04-20 MED ORDER — INSULIN GLARGINE-YFGN 100 UNIT/ML ~~LOC~~ SOPN
35.0000 [IU] | PEN_INJECTOR | Freq: Every day | SUBCUTANEOUS | 2 refills | Status: DC
  Filled 2023-04-20 – 2023-04-25 (×2): qty 9, 25d supply, fill #0
  Filled 2023-05-19: qty 9, 25d supply, fill #1
  Filled 2023-06-24: qty 9, 25d supply, fill #2

## 2023-04-20 MED ORDER — ATORVASTATIN CALCIUM 20 MG PO TABS
20.0000 mg | ORAL_TABLET | Freq: Every day | ORAL | 1 refills | Status: DC
Start: 2023-04-20 — End: 2024-01-18
  Filled 2023-04-20 – 2023-08-25 (×2): qty 90, 90d supply, fill #0

## 2023-04-20 MED ORDER — AMLODIPINE BESYLATE 10 MG PO TABS
10.0000 mg | ORAL_TABLET | Freq: Every day | ORAL | 1 refills | Status: DC
Start: 2023-04-20 — End: 2023-12-06
  Filled 2023-04-20 – 2023-05-19 (×2): qty 90, 90d supply, fill #0
  Filled 2023-07-22 – 2023-08-25 (×2): qty 90, 90d supply, fill #1

## 2023-04-20 NOTE — Progress Notes (Signed)
Subjective:  Patient ID: Brenda [name], female    DOB: 04/16/1961  Age: 62 y.o. MRN: [id number]  CC: Medical Management of Chronic Issues   HPI Brenda [name] is a 62 y.o. year old female with a history of  Type 2 Diabetes Mellitus (A1c 6.6), Hypertension, hypothyroidism, obesity.   Interval History: Discussed the use of AI scribe software for clinical note transcription with the patient, who gave verbal consent to proceed.  She is a Engineer, site  and presents with a concern of mismatched speech. She reports that what she intends to say does not match what she actually verbalizes, leading to confusion. This has been noticed by her family during conversations, leading to questions about her coherence.  He endorses a family history of Alzheimer's disease. She denies any memory loss or forgetting things, and there are no reported speech impediments or weakness. She has a history of high blood pressure, which once required hospitalization in the ICU, denies a previous history of a stroke. She denies any headaches or blurred vision.   Her diabetes is controlled with A1c of 7.3 which has trended up slightly from 6.6.  She endorses adherence with her medications. She is adherent with her antihypertensive and her statin.  And she has had cataract surgery with intraocular lens placement due to diabetes-related eye issues.       Past Medical History:  Diagnosis Date   Arthritis    Colon cancer (HCC) 09/08/2009   Colonoscopy by Dr Loreta Ave; Surgery by Dr Dwain Sarna   Diabetes mellitus    Family history of breast cancer    aunt and cousin on father's side   Family history of colon cancer    grandmother - father's side   Family history of prostate cancer    uncle - (father's brother)   Fatigue    Headache(784.0)    Hemorrhoid    Hyperlipidemia    Hypertension    Neuropathy    feet   Night sweats    Thyroid disease    Wears glasses     Past Surgical History:  Procedure Laterality Date    ABDOMINAL HYSTERECTOMY     APPENDECTOMY     COLONOSCOPY  05/14/2011   Dr.Pyrtle   CYST REMOVED     BACK OF HEAD   RIGHT COLECTOMY  09/09/2009   FOR COLON CANCER   THYROID SURGERY  09/02/2010   Clarify with patient; BENIGN PER PT   TUBAL LIGATION      Family History  Problem Relation Age of Onset   Colon polyps Mother    Diabetes Father    Heart disease Father    Hypertension Father    Kidney disease Father    Diabetes Sister    Breast cancer Paternal Aunt    Colon cancer Paternal Grandmother 47   Breast cancer Cousin    Breast cancer Cousin    Diabetes Brother    Esophageal cancer Neg Hx    Rectal cancer Neg Hx    Stomach cancer Neg Hx     Social History   Socioeconomic History   Marital status: Married    Spouse name: Not on file   Number of children: Not on file   Years of education: Not on file   Highest education level: Master's degree (e.g., MA, MS, MEng, MEd, MSW, MBA)  Occupational History   Not on file  Tobacco Use   Smoking status: Former    Current packs/day: 0.00    Types: Cigarettes  Quit date: 04/05/1981    Years since quitting: 42.0   Smokeless tobacco: Never  Vaping Use   Vaping status: Never Used  Substance and Sexual Activity   Alcohol use: No   Drug use: No   Sexual activity: Not Currently  Other Topics Concern   Not on file  Social History Narrative   Not on file   Social Determinants of Health   Financial Resource Strain: Medium Risk (10/13/2022)   Overall Financial Resource Strain (CARDIA)    Difficulty of Paying Living Expenses: Somewhat hard  Food Insecurity: No Food Insecurity (10/13/2022)   Hunger Vital Sign    Worried About Running Out of Food in the Last Year: Never true    Ran Out of Food in the Last Year: Never true  Transportation Needs: No Transportation Needs (10/13/2022)   PRAPARE - Administrator, Civil Service (Medical): No    Lack of Transportation (Non-Medical): No  Physical Activity: Sufficiently  Active (10/13/2022)   Exercise Vital Sign    Days of Exercise per Week: 4 days    Minutes of Exercise per Session: 120 min  Stress: Stress Concern Present (10/13/2022)   [employer] of Occupational Health - Occupational Stress Questionnaire    Feeling of Stress : To some extent  Social Connections: Moderately Integrated (10/13/2022)   Social Connection and Isolation Panel [NHANES]    Frequency of Communication with Friends and Family: Once a week    Frequency of Social Gatherings with Friends and Family: Once a week    Attends Religious Services: More than 4 times per year    Active Member of [employer] or Organizations: Yes    Attends Engineer, structural: More than 4 times per year    Marital Status: Married    No Known Allergies  Outpatient Medications Prior to Visit  Medication Sig Dispense Refill   Blood Glucose Monitoring Suppl (TRUE METRIX METER) w/Device KIT Use 3 times daily to check blood sugars 1 kit 0   fluticasone (FLONASE) 50 MCG/ACT nasal spray Place 2 sprays into both nostrils daily. 16 g 0   gabapentin (NEURONTIN) 100 MG capsule Take 1 capsule (100 mg total) by mouth 3 (three) times daily. 90 capsule 3   glucose blood (TRUE METRIX BLOOD GLUCOSE TEST) test strip Use as instructed to check blood sugars 3 times daily 100 each 12   Insulin Pen Needle (TECHLITE PEN NEEDLES) 31G X 5 MM MISC Use as directed. 100 each 0   levothyroxine (SYNTHROID) 112 MCG tablet Take 1 tablet (112 mcg total) by mouth daily before breakfast. 30 tablet 6   TRUEplus Lancets 28G MISC Use to help check blood sugars 3 times daily 1100 each 11   amLODipine (NORVASC) 10 MG tablet TAKE 1 TABLET (10 MG TOTAL) BY MOUTH DAILY. FOR BLOOD PRESSURE 90 tablet 1   atorvastatin (LIPITOR) 20 MG tablet Take 1 tablet (20 mg total) by mouth daily. 90 tablet 1   dapagliflozin propanediol (FARXIGA) 5 MG TABS tablet Take 1 tablet (5 mg total) by mouth daily before breakfast. 90 tablet 0   dicyclomine (BENTYL)  10 MG capsule Take 1 capsule (10 mg total) by mouth 2 (two) times daily as needed for spasms. 180 capsule 1   DULoxetine (CYMBALTA) 60 MG capsule Take 1 capsule (60 mg total) by mouth daily. For neuropathy 90 capsule 1   insulin glargine-yfgn (SEMGLEE, YFGN,) 100 UNIT/ML Pen Inject 35 Units into the skin at bedtime. 9 mL 2   lisinopril-hydrochlorothiazide (  ZESTORETIC) 20-25 MG tablet Take 1 tablet by mouth daily. 90 tablet 0   metoprolol succinate (TOPROL-XL) 50 MG 24 hr tablet Take 1 tablet (50 mg total) by mouth daily. 90 tablet 0   Semaglutide, 2 MG/DOSE, (OZEMPIC, 2 MG/DOSE,) 8 MG/3ML SOPN Inject 2 mg as directed once a week. 3 mL 6   promethazine-dextromethorphan (PROMETHAZINE-DM) 6.25-15 MG/5ML syrup Take 5 mLs by mouth 4 (four) times daily as needed for cough. 118 mL 0   No facility-administered medications prior to visit.     ROS Review of Systems  Constitutional:  Negative for activity change and appetite change.  HENT:  Negative for sinus pressure and sore throat.   Respiratory:  Negative for chest tightness, shortness of breath and wheezing.   Cardiovascular:  Negative for chest pain and palpitations.  Gastrointestinal:  Negative for abdominal distention, abdominal pain and constipation.  Genitourinary: Negative.   Musculoskeletal: Negative.   Psychiatric/Behavioral:  Negative for behavioral problems and dysphoric mood.     Objective:  BP 136/75   Pulse (!) 59   Ht 5' 8.5" (1.74 m)   Wt 239 lb 9.6 oz (108.7 kg)   SpO2 100%   BMI 35.90 kg/m      04/20/2023    8:42 AM 11/27/2022   12:21 PM 10/14/2022   10:39 AM  BP/Weight  Systolic BP 136 123 145  Diastolic BP 75 68 80  Wt. (Lbs) 239.6 238   BMI 35.9 kg/m2 35.66 kg/m2       Physical Exam Constitutional:      Appearance: She is well-developed.  Cardiovascular:     Rate and Rhythm: Bradycardia present.     Heart sounds: Normal heart sounds. No murmur heard. Pulmonary:     Effort: Pulmonary effort is normal.      Breath sounds: Normal breath sounds. No wheezing or rales.  Chest:     Chest wall: No tenderness.  Abdominal:     General: Bowel sounds are normal. There is no distension.     Palpations: Abdomen is soft. There is no mass.     Tenderness: There is no abdominal tenderness.  Musculoskeletal:        General: Normal range of motion.     Right lower leg: No edema.     Left lower leg: No edema.  Neurological:     Mental Status: She is alert and oriented to person, place, and time.     Cranial Nerves: No cranial nerve deficit.     Motor: No weakness.     Coordination: Coordination normal.     Gait: Gait normal.     Deep Tendon Reflexes: Reflexes abnormal.  Psychiatric:        Mood and Affect: Mood normal.        Latest Ref Rng & Units 10/14/2022   10:49 AM 10/13/2021    9:59 AM 04/15/2021   10:14 AM  CMP  Glucose 70 - 99 mg/dL 604  540  981   BUN 8 - 27 mg/dL 23  26  21    Creatinine 0.57 - 1.00 mg/dL 1.91  4.78  2.95   Sodium 134 - 144 mmol/L 140  142  140   Potassium 3.5 - 5.2 mmol/L 3.7  4.4  4.2   Chloride 96 - 106 mmol/L 99  101  101   CO2 20 - 29 mmol/L 25  22  26    Calcium 8.7 - 10.3 mg/dL 9.3  9.0  9.7   Total Protein 6.0 -  8.5 g/dL 6.9  6.7  7.2   Total Bilirubin 0.0 - 1.2 mg/dL 0.5  0.3  0.5   Alkaline Phos 44 - 121 IU/L 110  102  102   AST 0 - 40 IU/L 20  18  13    ALT 0 - 32 IU/L 21  20  18      Lipid Panel     Component Value Date/Time   CHOL 145 10/14/2022 1049   TRIG 99 10/14/2022 1049   HDL 41 10/14/2022 1049   CHOLHDL 4.1 04/15/2021 1014   LDLCALC 86 10/14/2022 1049    CBC    Component Value Date/Time   WBC 10.9 (H) 12/03/2020 1751   RBC 4.56 12/03/2020 1751   HGB 11.9 (L) 12/03/2020 1751   HGB 11.9 10/28/2011 0936   HCT 37.5 12/03/2020 1751   HCT 35.9 10/28/2011 0936   PLT 247 12/03/2020 1751   PLT 202 10/28/2011 0936   MCV 82.2 12/03/2020 1751   MCV 82.3 10/28/2011 0936   MCH 26.1 12/03/2020 1751   MCHC 31.7 12/03/2020 1751   RDW 14.0  12/03/2020 1751   RDW 15.1 (H) 10/28/2011 0936   LYMPHSABS 2.8 12/03/2020 1751   LYMPHSABS 1.8 10/28/2011 0936   MONOABS 0.8 12/03/2020 1751   MONOABS 0.5 10/28/2011 0936   EOSABS 0.1 12/03/2020 1751   EOSABS 0.1 10/28/2011 0936   BASOSABS 0.1 12/03/2020 1751   BASOSABS 0.0 10/28/2011 0936    Lab Results  Component Value Date   HGBA1C 6.6 10/14/2022    Lab Results  Component Value Date   TSH 0.792 10/14/2022    Assessment & Plan:      Expressive aphasia Patient reports a mismatch between her thoughts and her spoken words, causing concern due to a family history of Alzheimer's. No evidence of stroke on physical examination. No reported memory loss or speech impediment. -Order CT scan to rule out TIA -Continue to monitor symptoms.  Hypertension Well controlled on current medication regimen (Amlodipine, Lisinopril, Hydrochlorothiazide, Metoprolol). -Continue current medications. -Check kidney function and electrolytes  Type II diabetes mellitus with albuminuria A1c slightly elevated from previous reading (7.3 from 6.6). Patient on Ozempic, Lantus (35 units), and Farxiga. -Continue current medications. -Encourage lifestyle modifications.  Hyperlipidemia Well controlled on Atorvastatin. -Continue Atorvastatin.  Diabetic neuropathy Patient on Gabapentin and Duloxetine. -Continue current medications.  Diabetic retinopathy Patient had cataract surgery and intraocular lenses placed due to diabetes-related eye issues. Follow-up with ophthalmologist every six months. -Continue current follow-up plan.  General Health Maintenance -Resume daily Aspirin. -Order labs to check kidney function and electrolytes. -Remove Pap smear from chart due to patient's hysterectomy. -Follow-up in six months.          Meds ordered this encounter  Medications   amLODipine (NORVASC) 10 MG tablet    Sig: TAKE 1 TABLET (10 MG TOTAL) BY MOUTH DAILY. FOR BLOOD PRESSURE    Dispense:  90  tablet    Refill:  1   atorvastatin (LIPITOR) 20 MG tablet    Sig: Take 1 tablet (20 mg total) by mouth daily.    Dispense:  90 tablet    Refill:  1   dapagliflozin propanediol (FARXIGA) 5 MG TABS tablet    Sig: Take 1 tablet (5 mg total) by mouth daily before breakfast.    Dispense:  90 tablet    Refill:  0   dicyclomine (BENTYL) 10 MG capsule    Sig: Take 1 capsule (10 mg total) by mouth 2 (two) times daily  as needed for spasms.    Dispense:  180 capsule    Refill:  1   DULoxetine (CYMBALTA) 60 MG capsule    Sig: Take 1 capsule (60 mg total) by mouth daily. For neuropathy    Dispense:  90 capsule    Refill:  1   insulin glargine-yfgn (SEMGLEE, YFGN,) 100 UNIT/ML Pen    Sig: Inject 35 Units into the skin at bedtime.    Dispense:  9 mL    Refill:  2   lisinopril-hydrochlorothiazide (ZESTORETIC) 20-25 MG tablet    Sig: Take 1 tablet by mouth daily.    Dispense:  90 tablet    Refill:  0   metoprolol succinate (TOPROL-XL) 50 MG 24 hr tablet    Sig: Take 1 tablet (50 mg total) by mouth daily.    Dispense:  90 tablet    Refill:  0   Semaglutide, 2 MG/DOSE, (OZEMPIC, 2 MG/DOSE,) 8 MG/3ML SOPN    Sig: Inject 2 mg as directed once a week.    Dispense:  3 mL    Refill:  6    Follow-up: Return in about 6 months (around 10/19/2023) for Chronic medical conditions.       Hoy Register, MD, FAAFP. [facility] [place], [place] [phone]   04/20/2023, 9:18 AM

## 2023-04-20 NOTE — Patient Instructions (Addendum)
Exercising to Stay Healthy To become healthy and stay healthy, it is recommended that you do moderate-intensity and vigorous-intensity exercise. You can tell that you are exercising at a moderate intensity if your heart starts beating faster and you start breathing faster but can still hold a conversation. You can tell that you are exercising at a vigorous intensity if you are breathing much harder and faster and cannot hold a conversation while exercising. How can exercise benefit me? Exercising regularly is important. It has many health benefits, such as: Improving overall fitness, flexibility, and endurance. Increasing bone density. Helping with weight control. Decreasing body fat. Increasing muscle strength and endurance. Reducing stress and tension, anxiety, depression, or anger. Improving overall health. What guidelines should I follow while exercising? Before you start a new exercise program, talk with your health care provider. Do not exercise so much that you hurt yourself, feel dizzy, or get very short of breath. Wear comfortable clothes and wear shoes with good support. Drink plenty of water while you exercise to prevent dehydration or heat stroke. Work out until your breathing and your heartbeat get faster (moderate intensity). How often should I exercise? Choose an activity that you enjoy, and set realistic goals. Your health care provider can help you make an activity plan that is individually designed and works best for you. Exercise regularly as told by your health care provider. This may include: Doing strength training two times a week, such as: Lifting weights. Using resistance bands. Push-ups. Sit-ups. Yoga. Doing a certain intensity of exercise for a given amount of time. Choose from these options: A total of 150 minutes of moderate-intensity exercise every week. A total of 75 minutes of vigorous-intensity exercise every week. A mix of moderate-intensity and  vigorous-intensity exercise every week. Children, pregnant women, people who have not exercised regularly, people who are overweight, and older adults may need to talk with a health care provider about what activities are safe to perform. If you have a medical condition, be sure to talk with your health care provider before you start a new exercise program. What are some exercise ideas? Moderate-intensity exercise ideas include: Walking 1 mile (1.6 km) in about 15 minutes. Biking. Hiking. Golfing. Dancing. Water aerobics. Vigorous-intensity exercise ideas include: Walking 4.5 miles (7.2 km) or more in about 1 hour. Jogging or running 5 miles (8 km) in about 1 hour. Biking 10 miles (16.1 km) or more in about 1 hour. Lap swimming. Roller-skating or in-line skating. Cross-country skiing. Vigorous competitive sports, such as football, basketball, and soccer. Jumping rope. Aerobic dancing. What are some everyday activities that can help me get exercise? Yard work, such as: Pushing a lawn mower. Raking and bagging leaves. Washing your car. Pushing a stroller. Shoveling snow. Gardening. Washing windows or floors. How can I be more active in my day-to-day activities? Use stairs instead of an elevator. Take a walk during your lunch break. If you drive, park your car farther away from your work or school. If you take public transportation, get off one stop early and walk the rest of the way. Stand up or walk around during all of your indoor phone calls. Get up, stretch, and walk around every 30 minutes throughout the day. Enjoy exercise with a friend. Support to continue exercising will help you keep a regular routine of activity. Where to find more information You can find more information about exercising to stay healthy from: [place] Department of Health and Human Services: www.hhs.gov Centers for Disease Control and Prevention (  CDC): www.cdc.gov Summary Exercising regularly is  important. It will improve your overall fitness, flexibility, and endurance. Regular exercise will also improve your overall health. It can help you control your weight, reduce stress, and improve your bone density. Do not exercise so much that you hurt yourself, feel dizzy, or get very short of breath. Before you start a new exercise program, talk with your health care provider. This information is not intended to replace advice given to you by your health care provider. Make sure you discuss any questions you have with your health care provider. Document Revised: 10/31/2020 Document Reviewed: 10/31/2020 Elsevier Patient Education  2024 [employer]  

## 2023-04-21 LAB — CMP14+EGFR
ALT: 19 [IU]/L (ref 0–32)
AST: 21 [IU]/L (ref 0–40)
Albumin: 3.9 g/dL (ref 3.9–4.9)
Alkaline Phosphatase: 108 [IU]/L (ref 44–121)
BUN/Creatinine Ratio: 15 (ref 12–28)
BUN: 15 mg/dL (ref 8–27)
Bilirubin Total: 0.3 mg/dL (ref 0.0–1.2)
CO2: 27 mmol/L (ref 20–29)
Calcium: 9.1 mg/dL (ref 8.7–10.3)
Chloride: 99 mmol/L (ref 96–106)
Creatinine, Ser: 1.03 mg/dL — ABNORMAL HIGH (ref 0.57–1.00)
Globulin, Total: 2.9 g/dL (ref 1.5–4.5)
Glucose: 129 mg/dL — ABNORMAL HIGH (ref 70–99)
Potassium: 4.1 mmol/L (ref 3.5–5.2)
Sodium: 139 mmol/L (ref 134–144)
Total Protein: 6.8 g/dL (ref 6.0–8.5)
eGFR: 61 mL/min/{1.73_m2} (ref >59)

## 2023-04-25 ENCOUNTER — Other Ambulatory Visit: Payer: Self-pay

## 2023-04-29 ENCOUNTER — Other Ambulatory Visit: Payer: Self-pay

## 2023-04-29 ENCOUNTER — Other Ambulatory Visit: Payer: Self-pay | Admitting: Family Medicine

## 2023-04-29 MED ORDER — TRUEPLUS 5-BEVEL PEN NEEDLES 31G X 5 MM MISC
2 refills | Status: AC
  Filled 2023-04-29: qty 100, 25d supply, fill #0
  Filled 2023-10-31 – 2024-01-19 (×3): qty 100, 25d supply, fill #1

## 2023-05-11 ENCOUNTER — Other Ambulatory Visit: Payer: Self-pay

## 2023-05-19 ENCOUNTER — Other Ambulatory Visit: Payer: Self-pay

## 2023-05-19 ENCOUNTER — Other Ambulatory Visit: Payer: Self-pay | Admitting: Family Medicine

## 2023-05-19 DIAGNOSIS — E1159 Type 2 diabetes mellitus with other circulatory complications: Secondary | ICD-10-CM

## 2023-05-19 NOTE — Telephone Encounter (Signed)
Refused Toprol-XL 50 mg because it's being requested too soon.

## 2023-05-23 ENCOUNTER — Other Ambulatory Visit: Payer: Self-pay

## 2023-05-25 ENCOUNTER — Other Ambulatory Visit: Payer: Self-pay

## 2023-06-01 ENCOUNTER — Other Ambulatory Visit: Payer: Self-pay

## 2023-06-24 ENCOUNTER — Other Ambulatory Visit: Payer: Self-pay

## 2023-07-14 ENCOUNTER — Other Ambulatory Visit: Payer: Self-pay

## 2023-07-19 ENCOUNTER — Other Ambulatory Visit: Payer: Self-pay

## 2023-07-22 ENCOUNTER — Other Ambulatory Visit: Payer: Self-pay

## 2023-07-22 ENCOUNTER — Other Ambulatory Visit: Payer: Self-pay | Admitting: Family Medicine

## 2023-07-22 DIAGNOSIS — E1159 Type 2 diabetes mellitus with other circulatory complications: Secondary | ICD-10-CM

## 2023-07-22 DIAGNOSIS — Z794 Long term (current) use of insulin: Secondary | ICD-10-CM

## 2023-07-22 MED ORDER — INSULIN GLARGINE-YFGN 100 UNIT/ML ~~LOC~~ SOPN
35.0000 [IU] | PEN_INJECTOR | Freq: Every day | SUBCUTANEOUS | 2 refills | Status: DC
  Filled 2023-07-22 – 2023-09-30 (×2): qty 9, 25d supply, fill #0
  Filled 2023-10-31 – 2023-11-16 (×2): qty 9, 25d supply, fill #1

## 2023-07-22 MED ORDER — METOPROLOL SUCCINATE ER 50 MG PO TB24
50.0000 mg | ORAL_TABLET | Freq: Every day | ORAL | 0 refills | Status: DC
  Filled 2023-07-22 – 2023-08-25 (×2): qty 90, 90d supply, fill #0

## 2023-07-22 MED ORDER — LISINOPRIL-HYDROCHLOROTHIAZIDE 20-25 MG PO TABS
1.0000 | ORAL_TABLET | Freq: Every day | ORAL | 0 refills | Status: DC
  Filled 2023-07-22 – 2023-08-25 (×2): qty 90, 90d supply, fill #0

## 2023-07-25 ENCOUNTER — Other Ambulatory Visit: Payer: Self-pay

## 2023-08-03 ENCOUNTER — Other Ambulatory Visit: Payer: Self-pay

## 2023-08-25 ENCOUNTER — Other Ambulatory Visit: Payer: Self-pay

## 2023-08-26 ENCOUNTER — Other Ambulatory Visit: Payer: Self-pay

## 2023-08-30 ENCOUNTER — Other Ambulatory Visit: Payer: Self-pay

## 2023-09-01 ENCOUNTER — Other Ambulatory Visit: Payer: Self-pay

## 2023-09-30 ENCOUNTER — Other Ambulatory Visit: Payer: Self-pay | Admitting: Family Medicine

## 2023-09-30 ENCOUNTER — Other Ambulatory Visit: Payer: Self-pay

## 2023-09-30 DIAGNOSIS — E1159 Type 2 diabetes mellitus with other circulatory complications: Secondary | ICD-10-CM

## 2023-09-30 MED ORDER — LISINOPRIL-HYDROCHLOROTHIAZIDE 20-25 MG PO TABS
1.0000 | ORAL_TABLET | Freq: Every day | ORAL | 0 refills | Status: DC
  Filled 2023-09-30 – 2023-12-06 (×2): qty 30, 30d supply, fill #0

## 2023-10-12 ENCOUNTER — Other Ambulatory Visit: Payer: Self-pay | Admitting: Family Medicine

## 2023-10-12 ENCOUNTER — Other Ambulatory Visit: Payer: Self-pay

## 2023-10-12 ENCOUNTER — Other Ambulatory Visit: Payer: Self-pay | Admitting: Nurse Practitioner

## 2023-10-12 DIAGNOSIS — J00 Acute nasopharyngitis [common cold]: Secondary | ICD-10-CM

## 2023-10-12 DIAGNOSIS — E1142 Type 2 diabetes mellitus with diabetic polyneuropathy: Secondary | ICD-10-CM

## 2023-10-13 ENCOUNTER — Other Ambulatory Visit: Payer: Self-pay

## 2023-10-13 MED ORDER — FLUTICASONE PROPIONATE 50 MCG/ACT NA SUSP
2.0000 | Freq: Every day | NASAL | 0 refills | Status: AC
  Filled 2023-10-13: qty 16, 30d supply, fill #0

## 2023-10-13 MED ORDER — GABAPENTIN 100 MG PO CAPS
100.0000 mg | ORAL_CAPSULE | Freq: Three times a day (TID) | ORAL | 3 refills | Status: DC
Start: 2023-10-13 — End: 2024-01-18
  Filled 2023-10-13: qty 90, 30d supply, fill #0
  Filled 2023-12-06: qty 90, 30d supply, fill #1

## 2023-10-31 ENCOUNTER — Other Ambulatory Visit: Payer: Self-pay

## 2023-11-08 ENCOUNTER — Other Ambulatory Visit: Payer: Self-pay

## 2023-11-09 ENCOUNTER — Other Ambulatory Visit: Payer: Self-pay

## 2023-11-14 ENCOUNTER — Other Ambulatory Visit: Payer: Self-pay

## 2023-11-14 ENCOUNTER — Encounter (HOSPITAL_COMMUNITY): Payer: Self-pay | Admitting: Emergency Medicine

## 2023-11-14 ENCOUNTER — Ambulatory Visit (HOSPITAL_COMMUNITY)
Admission: EM | Admit: 2023-11-14 | Discharge: 2023-11-14 | Disposition: A | Discharge status: Home / Self Care | Attending: Family Medicine | Admitting: Family Medicine

## 2023-11-14 DIAGNOSIS — J069 Acute upper respiratory infection, unspecified: Secondary | ICD-10-CM | POA: Diagnosis not present

## 2023-11-14 LAB — POCT URINALYSIS DIP (MANUAL ENTRY)
Bilirubin, UA: NEGATIVE
Glucose, UA: NEGATIVE mg/dL
Ketones, POC UA: NEGATIVE mg/dL
Leukocytes, UA: NEGATIVE
Nitrite, UA: NEGATIVE
Protein Ur, POC: NEGATIVE mg/dL
Spec Grav, UA: 1.02 (ref 1.010–1.025)
Urobilinogen, UA: 0.2 U/dL
pH, UA: 5.5 (ref 5.0–8.0)

## 2023-11-14 LAB — POC COVID19/FLU A&B COMBO
Covid Antigen, POC: NEGATIVE
Influenza A Antigen, POC: NEGATIVE
Influenza B Antigen, POC: NEGATIVE

## 2023-11-14 NOTE — ED Provider Notes (Signed)
 [facility]    CSN: [id number] Arrival date & time: 11/14/23  7829      History   Chief Complaint Chief Complaint  Patient presents with   Cough    HPI Brenda [name] is a 63 y.o. female.   Patient reporting 2 weeks of symptoms.  Patient notes body aches as well as nonproductive cough and sneezing.  Patient also notes that she has been increasing her urinary frequency.  Has generic pains of back complaints but nothing acute.  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  No fevers or chills.   Cough   Past Medical History:  Diagnosis Date   Arthritis    Colon cancer (HCC) 09/08/2009   Colonoscopy by Dr Tova Fresh; Surgery by Dr Delane Fear   Diabetes mellitus    Family history of breast cancer    aunt and cousin on father's side   Family history of colon cancer    grandmother - father's side   Family history of prostate cancer    uncle - (father's brother)   Fatigue    Headache(784.0)    Hemorrhoid    Hyperlipidemia    Hypertension    Neuropathy    feet   Night sweats    Thyroid  disease    Wears glasses     Patient Active Problem List   Diagnosis Date Noted   Colon cancer (HCC) 05/01/2012   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      Home Medications    Prior to Admission medications   Medication Sig Start Date End Date Taking? Authorizing Provider  fluticasone  (FLONASE ) 50 MCG/ACT nasal spray Place 2 sprays into both nostrils daily. 10/13/23   Newlin, Enobong, MD  amLODipine  (NORVASC ) 10  MG tablet TAKE 1 TABLET (10 MG TOTAL) BY MOUTH DAILY. FOR BLOOD PRESSURE 04/20/23   Joaquin Mulberry, MD  atorvastatin  (LIPITOR) 20 MG tablet Take 1 tablet (20 mg total) by mouth daily. 04/20/23   Newlin, Enobong, MD  Blood Glucose Monitoring Suppl (TRUE METRIX METER) w/Device KIT Use 3 times daily to check blood sugars 06/21/19   Fulp, Cammie, MD  dapagliflozin  propanediol (FARXIGA ) 5 MG TABS tablet Take 1 tablet (5 mg total) by mouth daily before breakfast. 04/20/23   Newlin, Enobong, MD  dicyclomine  (BENTYL ) 10 MG capsule Take 1 capsule (10 mg total) by mouth 2 (two) times daily as needed for spasms. 04/20/23   Newlin, Enobong, MD  DULoxetine  (CYMBALTA ) 60 MG capsule Take 1 capsule (60 mg total) by mouth daily. For neuropathy 04/20/23   Joaquin Mulberry, MD  gabapentin  (NEURONTIN ) 100 MG capsule Take 1 capsule (100 mg total) by mouth 3 (three) times daily. 10/13/23   Newlin, Enobong, MD  glucose blood (TRUE METRIX BLOOD GLUCOSE TEST) test strip Use as instructed to check blood sugars 3 times daily 06/21/19   Fulp, Cammie, MD  insulin  glargine-yfgn (SEMGLEE , YFGN,) 100 UNIT/ML Pen Inject 35 Units  into the skin at bedtime. 07/22/23   Newlin, Enobong, MD  Insulin  Pen Needle (TRUEPLUS 5-BEVEL PEN NEEDLES) 31G X 5 MM MISC Use as directed. 04/29/23   Newlin, Enobong, MD  levothyroxine  (SYNTHROID ) 112 MCG tablet Take 1 tablet (112 mcg total) by mouth daily before breakfast. 12/28/22   Joaquin Mulberry, MD  lisinopril -hydrochlorothiazide  (ZESTORETIC ) 20-25 MG tablet Take 1 tablet by mouth daily. Please schedule PCP visit for more refills. 09/30/23   Newlin, Enobong, MD  metoprolol  succinate (TOPROL -XL) 50 MG 24 hr tablet Take 1 tablet (50 mg total) by mouth daily. 07/22/23   Newlin, Enobong, MD  promethazine -dextromethorphan (PROMETHAZINE -DM) 6.25-15 MG/5ML syrup Take 5 mLs by mouth 4 (four) times daily as needed for cough. 11/27/22   Harlow Lighter, [place]  N, FNP  Semaglutide , 2 MG/DOSE, (OZEMPIC , 2 MG/DOSE,) 8 MG/3ML SOPN  Inject 2 mg as directed once a week. 04/20/23   Newlin, Enobong, MD  TRUEplus Lancets 28G MISC Use to help check blood sugars 3 times daily 06/21/19   Fulp, Cammie, MD  Insulin  Glargine (BASAGLAR  KWIKPEN) 100 UNIT/ML Inject 35 Units into the skin daily. 04/15/22 08/23/22  Joaquin Mulberry, MD    Family History Family History  Problem Relation Age of Onset   Colon polyps Mother    Diabetes Father    Heart disease Father    Hypertension Father    Kidney disease Father    Diabetes Sister    Diabetes Brother    Colon cancer Paternal Grandmother 58   Breast cancer Paternal Aunt    Breast cancer Cousin    Breast cancer Cousin    Esophageal cancer Neg Hx    Rectal cancer Neg Hx    Stomach cancer Neg Hx     Social History Social History   Tobacco Use   Smoking status: Former    Current packs/day: 0.00    Types: Cigarettes    Quit date: 04/05/1981    Years since quitting: 42.6   Smokeless tobacco: Never  Vaping Use   Vaping status: Never Used  Substance Use Topics   Alcohol use: No   Drug use: No     Allergies   Patient has no known allergies.   Review of Systems Review of Systems  Respiratory:  Positive for cough.      Physical Exam Triage Vital Signs ED Triage Vitals  Encounter Vitals Group     BP 11/14/23 0827 (!) 149/77     Systolic BP Percentile --      Diastolic BP Percentile --      Pulse Rate 11/14/23 0827 64     Resp 11/14/23 0827 20     Temp 11/14/23 0827 98.5 F (36.9 C)     Temp Source 11/14/23 0827 Oral     SpO2 11/14/23 0827 98 %     Weight --      Height --      Head Circumference --      Peak Flow --      Pain Score 11/14/23 0824 8     Pain Loc --      Pain Education --      Exclude from Growth Chart --    No data found.  Updated Vital Signs BP (!) 149/77 (BP Location: Left Arm) Comment (BP Location): large cuff.  has not had medicines this morning  Pulse 64   Temp 98.5 F (36.9 C) (Oral)   Resp 20   SpO2 98%   Visual Acuity Right  Eye Distance:  Left Eye Distance:   Bilateral Distance:    Right Eye Near:   Left Eye Near:    Bilateral Near:     Physical Exam Constitutional:      Appearance: Normal appearance.  HENT:     Head: Normocephalic.     Nose: Congestion and rhinorrhea present.     Mouth/Throat:     Pharynx: No posterior oropharyngeal erythema.  Eyes:     Extraocular Movements: Extraocular movements intact.     Conjunctiva/sclera: Conjunctivae normal.     Pupils: Pupils are equal, round, and reactive to light.  Cardiovascular:     Rate and Rhythm: Normal rate and regular rhythm.     Pulses: Normal pulses.     Heart sounds: Normal heart sounds. No murmur heard. Pulmonary:     Effort: Pulmonary effort is normal. No respiratory distress.     Breath sounds: Normal breath sounds. No wheezing or rales.  Neurological:     Mental Status: She is alert.      UC Treatments / Results  Labs (all labs ordered are listed, but only abnormal results are displayed) Labs Reviewed  POCT URINALYSIS DIP (MANUAL ENTRY) - Abnormal; Notable for the following components:      Result Value   Blood, UA large (*)    All other components within normal limits  POC COVID19/FLU A&B COMBO    EKG   Radiology No results found.  Procedures Procedures (including critical care time)  Medications Ordered in UC Medications - No data to display  Initial Impression / Assessment and Plan / UC Course  I have reviewed the triage vital signs and the nursing notes.  Pertinent labs & imaging results that were available during my care of the patient were reviewed by me and considered in my medical decision making (see chart for details).     Patient likely dealing with a viral upper respiratory infection and concurrent postnasal drip.  Patient notes that her symptoms feel like they have changed sides for the past 2 weeks.  Discussed with patient regarding steroid usage and given her history of diabetes, would avoid any  oral steroids.  Patient does have Flonase  but notes that she is taking it mainly in the morning.  Will go ahead and recommend Flonase  nightly, Mucinex  and use humidifier in the room in order to decrease mucus production and break up thickness of mucus. Final Clinical Impressions(s) / UC Diagnoses   Final diagnoses:  Viral URI with cough     Discharge Instructions      Please purchase Mucinex  over-the-counter, you may follow the instructions on package.  I would continue drinking lots of fluids as this will help soften the mucus.  If you can get a humidifier as well that would be good.  Please use your Flonase  at night     ED Prescriptions   None    PDMP not reviewed this encounter.   Jude Norton, MD 11/14/23 [phone]

## 2023-11-14 NOTE — Discharge Instructions (Signed)
 Please purchase Mucinex  over-the-counter, you may follow the instructions on package.  I would continue drinking lots of fluids as this will help soften the mucus.  If you can get a humidifier as well that would be good.  Please use your Flonase  at night

## 2023-11-14 NOTE — ED Triage Notes (Signed)
 Reports symptoms for 2 weeks.  Reports a cough and body aches, non-productive cough, sneezing.  Patient also complains of urinating frequently-started last week.  Patient complains of right back pain.   Has had dayquil, nyquil, ans back patches

## 2023-11-14 NOTE — ED Notes (Signed)
 Reviewed work note

## 2023-11-16 ENCOUNTER — Encounter: Payer: Self-pay | Admitting: Family Medicine

## 2023-11-16 ENCOUNTER — Other Ambulatory Visit: Payer: Self-pay | Admitting: Family Medicine

## 2023-11-16 ENCOUNTER — Other Ambulatory Visit: Payer: Self-pay

## 2023-11-16 DIAGNOSIS — E039 Hypothyroidism, unspecified: Secondary | ICD-10-CM

## 2023-11-16 MED ORDER — LEVOTHYROXINE SODIUM 112 MCG PO TABS
112.0000 ug | ORAL_TABLET | Freq: Every day | ORAL | 0 refills | Status: DC
  Filled 2023-11-16: qty 30, 30d supply, fill #0

## 2023-11-17 ENCOUNTER — Other Ambulatory Visit: Payer: Self-pay | Admitting: Pharmacist

## 2023-11-17 ENCOUNTER — Other Ambulatory Visit: Payer: Self-pay

## 2023-11-17 MED ORDER — LANTUS SOLOSTAR 100 UNIT/ML ~~LOC~~ SOPN
35.0000 [IU] | PEN_INJECTOR | Freq: Every day | SUBCUTANEOUS | 1 refills | Status: DC
  Filled 2023-11-17: qty 9, 25d supply, fill #0
  Filled 2023-12-06: qty 9, 25d supply, fill #1

## 2023-11-17 NOTE — Telephone Encounter (Signed)
 Brenda [name] can you help with this?

## 2023-11-22 ENCOUNTER — Other Ambulatory Visit: Payer: Self-pay

## 2023-11-28 ENCOUNTER — Other Ambulatory Visit: Payer: Self-pay

## 2023-12-01 ENCOUNTER — Other Ambulatory Visit: Payer: Self-pay

## 2023-12-06 ENCOUNTER — Other Ambulatory Visit: Payer: Self-pay | Admitting: Family Medicine

## 2023-12-06 DIAGNOSIS — E1159 Type 2 diabetes mellitus with other circulatory complications: Secondary | ICD-10-CM

## 2023-12-07 ENCOUNTER — Other Ambulatory Visit: Payer: Self-pay

## 2023-12-07 MED ORDER — METOPROLOL SUCCINATE ER 50 MG PO TB24
50.0000 mg | ORAL_TABLET | Freq: Every day | ORAL | 0 refills | Status: DC
  Filled 2023-12-07: qty 30, 30d supply, fill #0

## 2023-12-07 MED ORDER — AMLODIPINE BESYLATE 10 MG PO TABS
10.0000 mg | ORAL_TABLET | Freq: Every day | ORAL | 0 refills | Status: DC
  Filled 2023-12-07: qty 30, 30d supply, fill #0

## 2023-12-16 ENCOUNTER — Other Ambulatory Visit: Payer: Self-pay

## 2023-12-16 ENCOUNTER — Other Ambulatory Visit: Payer: Self-pay | Admitting: Family Medicine

## 2023-12-16 DIAGNOSIS — Z1231 Encounter for screening mammogram for malignant neoplasm of breast: Secondary | ICD-10-CM

## 2023-12-16 DIAGNOSIS — E039 Hypothyroidism, unspecified: Secondary | ICD-10-CM

## 2023-12-16 MED ORDER — LEVOTHYROXINE SODIUM 112 MCG PO TABS
112.0000 ug | ORAL_TABLET | Freq: Every day | ORAL | 0 refills | Status: DC
  Filled 2023-12-16: qty 30, 30d supply, fill #0

## 2023-12-21 ENCOUNTER — Ambulatory Visit
Admission: RE | Admit: 2023-12-21 | Discharge: 2023-12-21 | Disposition: A | Discharge status: Home / Self Care | Source: Ambulatory Visit

## 2023-12-21 DIAGNOSIS — Z1231 Encounter for screening mammogram for malignant neoplasm of breast: Secondary | ICD-10-CM | POA: Diagnosis not present

## 2023-12-27 ENCOUNTER — Ambulatory Visit: Payer: Self-pay | Admitting: Family Medicine

## 2024-01-18 ENCOUNTER — Other Ambulatory Visit: Payer: Self-pay

## 2024-01-18 ENCOUNTER — Encounter: Payer: Self-pay | Admitting: Family Medicine

## 2024-01-18 ENCOUNTER — Ambulatory Visit: Attending: Family Medicine | Admitting: Family Medicine

## 2024-01-18 ENCOUNTER — Telehealth: Payer: Self-pay | Admitting: Pharmacist

## 2024-01-18 VITALS — BP 155/74 | HR 62 | Ht 68.5 in | Wt 249.0 lb

## 2024-01-18 DIAGNOSIS — Z7984 Long term (current) use of oral hypoglycemic drugs: Secondary | ICD-10-CM

## 2024-01-18 DIAGNOSIS — Z794 Long term (current) use of insulin: Secondary | ICD-10-CM | POA: Diagnosis not present

## 2024-01-18 DIAGNOSIS — E1159 Type 2 diabetes mellitus with other circulatory complications: Secondary | ICD-10-CM | POA: Diagnosis not present

## 2024-01-18 DIAGNOSIS — I152 Hypertension secondary to endocrine disorders: Secondary | ICD-10-CM | POA: Diagnosis not present

## 2024-01-18 DIAGNOSIS — E1169 Type 2 diabetes mellitus with other specified complication: Secondary | ICD-10-CM | POA: Diagnosis not present

## 2024-01-18 DIAGNOSIS — E039 Hypothyroidism, unspecified: Secondary | ICD-10-CM

## 2024-01-18 DIAGNOSIS — E2839 Other primary ovarian failure: Secondary | ICD-10-CM

## 2024-01-18 DIAGNOSIS — E1142 Type 2 diabetes mellitus with diabetic polyneuropathy: Secondary | ICD-10-CM | POA: Diagnosis not present

## 2024-01-18 LAB — POCT GLYCOSYLATED HEMOGLOBIN (HGB A1C): HbA1c, POC (controlled diabetic range): 10 % — AB (ref 0.0–7.0)

## 2024-01-18 MED ORDER — DAPAGLIFLOZIN PROPANEDIOL 10 MG PO TABS
10.0000 mg | ORAL_TABLET | Freq: Every day | ORAL | 1 refills | Status: AC
  Filled 2024-01-18: qty 30, 30d supply, fill #0
  Filled 2024-01-19 – 2024-01-31 (×2): qty 90, 90d supply, fill #0

## 2024-01-18 MED ORDER — DULOXETINE HCL 60 MG PO CPEP
60.0000 mg | ORAL_CAPSULE | Freq: Every day | ORAL | 1 refills | Status: DC
  Filled 2024-01-18: qty 90, 90d supply, fill #0
  Filled 2024-04-20: qty 90, 90d supply, fill #1

## 2024-01-18 MED ORDER — AMLODIPINE BESYLATE 10 MG PO TABS
10.0000 mg | ORAL_TABLET | Freq: Every day | ORAL | 1 refills | Status: DC
  Filled 2024-01-18: qty 90, 90d supply, fill #0
  Filled 2024-04-20: qty 90, 90d supply, fill #1

## 2024-01-18 MED ORDER — LISINOPRIL-HYDROCHLOROTHIAZIDE 20-25 MG PO TABS
1.0000 | ORAL_TABLET | Freq: Every day | ORAL | 1 refills | Status: DC
  Filled 2024-01-18: qty 90, 90d supply, fill #0
  Filled 2024-04-20: qty 90, 90d supply, fill #1

## 2024-01-18 MED ORDER — LANTUS SOLOSTAR 100 UNIT/ML ~~LOC~~ SOPN
45.0000 [IU] | PEN_INJECTOR | Freq: Every day | SUBCUTANEOUS | 3 refills | Status: DC
  Filled 2024-01-18: qty 12, 26d supply, fill #0
  Filled 2024-02-21: qty 12, 26d supply, fill #1
  Filled 2024-04-03: qty 12, 26d supply, fill #2

## 2024-01-18 MED ORDER — ATORVASTATIN CALCIUM 20 MG PO TABS
20.0000 mg | ORAL_TABLET | Freq: Every day | ORAL | 1 refills | Status: DC
  Filled 2024-01-18: qty 90, 90d supply, fill #0

## 2024-01-18 MED ORDER — GABAPENTIN 300 MG PO CAPS
300.0000 mg | ORAL_CAPSULE | Freq: Three times a day (TID) | ORAL | 1 refills | Status: DC
  Filled 2024-01-18: qty 270, 90d supply, fill #0
  Filled 2024-07-20: qty 270, 90d supply, fill #1

## 2024-01-18 MED ORDER — METOPROLOL SUCCINATE ER 50 MG PO TB24
50.0000 mg | ORAL_TABLET | Freq: Every day | ORAL | 1 refills | Status: DC
  Filled 2024-01-18: qty 90, 90d supply, fill #0
  Filled 2024-04-20: qty 90, 90d supply, fill #1

## 2024-01-18 NOTE — Progress Notes (Signed)
 Subjective:  Patient ID: Brenda [name], female    DOB: 1960/09/20  Age: 63 y.o. MRN: [id number]  CC: Medical Management of Chronic Issues (Numbness in fingers/)       History of Present Illness Discussed the use of AI scribe software for clinical note transcription with the patient, who gave verbal consent to proceed.  History of Present Illness   Brenda [name] is a 63 year old female with a history of  Type 2 Diabetes Mellitus (A1c 6.6), Hypertension, hypothyroidism, obesity who presents with numbness and pain in her right hand.  She experiences numbness and pain in her right hand, particularly across the knuckles. The pain was initially severe but has lessened over time. It is exacerbated by touch, such as when covers touch her hand at night, and feels like 'needles sticking in it.' There is no swelling in her hands or knuckles. She is taking Cymbalta  and gabapentin  100 mg three times a day, though symptoms persist.  Her diabetes management is suboptimal, with a recent A1c of 10.0, up from 7.3 in October. She stopped Ozempic  due to insurance issues. Her blood sugar levels at home are high, with a recent reading of 238 mg/dL in the morning. She is not taking Farxiga  due to availability issues and is dissatisfied with her current insulin  regimen of Lantus  35 units, as it does not improve her blood sugar control.  Her blood pressure was elevated during the visit, but she had not taken her medication that morning. She is fasting and has not eaten since the previous day, as taking her medication without food makes her feel 'wobbly.' She is on atorvastatin  for cholesterol and levothyroxine  for hypothyroidism.         Past Medical History:  Diagnosis Date   Arthritis    Colon cancer (HCC) 09/08/2009   Colonoscopy by Dr Kristie; Surgery by Dr Ebbie   Diabetes mellitus    Family history of breast cancer    aunt and cousin on father's side   Family history of colon cancer     grandmother - father's side   Family history of prostate cancer    uncle - (father's brother)   Fatigue    Headache(784.0)    Hemorrhoid    Hyperlipidemia    Hypertension    Neuropathy    feet   Night sweats    Thyroid  disease    Wears glasses     Past Surgical History:  Procedure Laterality Date   ABDOMINAL HYSTERECTOMY     APPENDECTOMY     COLONOSCOPY  05/14/2011   Dr.Pyrtle   CYST REMOVED     BACK OF HEAD   RIGHT COLECTOMY  09/09/2009   FOR COLON CANCER   THYROID  SURGERY  09/02/2010   Clarify with patient; BENIGN PER PT   TUBAL LIGATION      Family History  Problem Relation Age of Onset   Colon polyps Mother    Diabetes Father    Heart disease Father    Hypertension Father    Kidney disease Father    Diabetes Sister    Diabetes Brother    Colon cancer Paternal Grandmother 32   Breast cancer Paternal Aunt    Breast cancer Cousin    Breast cancer Cousin    Esophageal cancer Neg Hx    Rectal cancer Neg Hx    Stomach cancer Neg Hx     Social History   Socioeconomic History   Marital status: Married  Spouse name: Not on file   Number of children: Not on file   Years of education: Not on file   Highest education level: Master's degree (e.g., MA, MS, MEng, MEd, MSW, MBA)  Occupational History   Not on file  Tobacco Use   Smoking status: Former    Current packs/day: 0.00    Types: Cigarettes    Quit date: 04/05/1981    Years since quitting: 42.8   Smokeless tobacco: Never  Vaping Use   Vaping status: Never Used  Substance and Sexual Activity   Alcohol use: No   Drug use: No   Sexual activity: Not Currently  Other Topics Concern   Not on file  Social History Narrative   Not on file   Social Drivers of Health   Financial Resource Strain: Medium Risk (10/13/2022)   Overall Financial Resource Strain (CARDIA)    Difficulty of Paying Living Expenses: Somewhat hard  Food Insecurity: No Food Insecurity (10/13/2022)   Hunger Vital Sign    Worried  About Running Out of Food in the Last Year: Never true    Ran Out of Food in the Last Year: Never true  Transportation Needs: No Transportation Needs (10/13/2022)   PRAPARE - Administrator, Civil Service (Medical): No    Lack of Transportation (Non-Medical): No  Physical Activity: Sufficiently Active (10/13/2022)   Exercise Vital Sign    Days of Exercise per Week: 4 days    Minutes of Exercise per Session: 120 min  Stress: Stress Concern Present (10/13/2022)   [employer] of Occupational Health - Occupational Stress Questionnaire    Feeling of Stress : To some extent  Social Connections: Moderately Integrated (10/13/2022)   Social Connection and Isolation Panel    Frequency of Communication with Friends and Family: Once a week    Frequency of Social Gatherings with Friends and Family: Once a week    Attends Religious Services: More than 4 times per year    Active Member of [employer] or Organizations: Yes    Attends Engineer, structural: More than 4 times per year    Marital Status: Married    No Known Allergies  Outpatient Medications Prior to Visit  Medication Sig Dispense Refill   Blood Glucose Monitoring Suppl (TRUE METRIX METER) w/Device KIT Use 3 times daily to check blood sugars 1 kit 0   dicyclomine  (BENTYL ) 10 MG capsule Take 1 capsule (10 mg total) by mouth 2 (two) times daily as needed for spasms. 180 capsule 1   fluticasone  (FLONASE ) 50 MCG/ACT nasal spray Place 2 sprays into both nostrils daily. 16 g 0   glucose blood (TRUE METRIX BLOOD GLUCOSE TEST) test strip Use as instructed to check blood sugars 3 times daily 100 each 12   Insulin  Pen Needle (TRUEPLUS 5-BEVEL PEN NEEDLES) 31G X 5 MM MISC Use as directed. 100 each 2   levothyroxine  (SYNTHROID ) 112 MCG tablet Take 1 tablet (112 mcg total) by mouth daily before breakfast.Patient will need appointment for more refills 30 tablet 0   TRUEplus Lancets 28G MISC Use to help check blood sugars 3 times daily  1100 each 11   amLODipine  (NORVASC ) 10 MG tablet Take 1 tablet (10 mg total) by mouth daily. Please schedule appointment with Dr. Ramondo Dietze for more refills. 30 tablet 0   atorvastatin  (LIPITOR) 20 MG tablet Take 1 tablet (20 mg total) by mouth daily. 90 tablet 1   DULoxetine  (CYMBALTA ) 60 MG capsule Take 1 capsule (60 mg  total) by mouth daily. For neuropathy 90 capsule 1   gabapentin  (NEURONTIN ) 100 MG capsule Take 1 capsule (100 mg total) by mouth 3 (three) times daily. 90 capsule 3   insulin  glargine (LANTUS  SOLOSTAR) 100 UNIT/ML Solostar Pen Inject 35 Units into the skin daily. 30 mL 1   lisinopril -hydrochlorothiazide  (ZESTORETIC ) 20-25 MG tablet Take 1 tablet by mouth daily. Please schedule PCP visit for more refills. 30 tablet 0   metoprolol  succinate (TOPROL -XL) 50 MG 24 hr tablet Take 1 tablet (50 mg total) by mouth daily. Please schedule appointment with Dr. Lennette Fader for more refills. 30 tablet 0   promethazine -dextromethorphan (PROMETHAZINE -DM) 6.25-15 MG/5ML syrup Take 5 mLs by mouth 4 (four) times daily as needed for cough. 118 mL 0   Semaglutide , 2 MG/DOSE, (OZEMPIC , 2 MG/DOSE,) 8 MG/3ML SOPN Inject 2 mg as directed once a week. (Patient not taking: Reported on 01/18/2024) 3 mL 6   dapagliflozin  propanediol (FARXIGA ) 5 MG TABS tablet Take 1 tablet (5 mg total) by mouth daily before breakfast. (Patient not taking: Reported on 01/18/2024) 90 tablet 0   No facility-administered medications prior to visit.     ROS Review of Systems  Constitutional:  Negative for activity change and appetite change.  HENT:  Negative for sinus pressure and sore throat.   Respiratory:  Negative for chest tightness, shortness of breath and wheezing.   Cardiovascular:  Negative for chest pain and palpitations.  Gastrointestinal:  Negative for abdominal distention, abdominal pain and constipation.  Genitourinary: Negative.   Musculoskeletal: Negative.   Neurological:  Positive for dizziness.   Psychiatric/Behavioral:  Negative for behavioral problems and dysphoric mood.     Objective:  BP (!) 155/74   Pulse 62   Ht 5' 8.5 (1.74 m)   Wt 249 lb (112.9 kg)   SpO2 98%   BMI 37.31 kg/m      01/18/2024   12:13 PM 01/18/2024   11:36 AM 11/14/2023    8:27 AM  BP/Weight  Systolic BP 155 161 149  Diastolic BP 74 80 77  Wt. (Lbs)  249   BMI  37.31 kg/m2       Physical Exam Constitutional:      Appearance: She is well-developed.  Cardiovascular:     Rate and Rhythm: Normal rate.     Heart sounds: Normal heart sounds. No murmur heard. Pulmonary:     Effort: Pulmonary effort is normal.     Breath sounds: Normal breath sounds. No wheezing or rales.  Chest:     Chest wall: No tenderness.  Abdominal:     General: Bowel sounds are normal. There is no distension.     Palpations: Abdomen is soft. There is no mass.     Tenderness: There is no abdominal tenderness.  Musculoskeletal:        General: Normal range of motion.     Right lower leg: No edema.     Left lower leg: No edema.     Comments: Negative Tinel or Phalen signs in both wrists  Neurological:     Mental Status: She is alert and oriented to person, place, and time.  Psychiatric:        Mood and Affect: Mood normal.        Latest Ref Rng & Units 04/20/2023    9:13 AM 10/14/2022   10:49 AM 10/13/2021    9:59 AM  CMP  Glucose 70 - 99 mg/dL 870  894  883   BUN 8 - 27 mg/dL 15  23  26   Creatinine 0.57 - 1.00 mg/dL 8.96  9.05  9.07   Sodium 134 - 144 mmol/L 139  140  142   Potassium 3.5 - 5.2 mmol/L 4.1  3.7  4.4   Chloride 96 - 106 mmol/L 99  99  101   CO2 20 - 29 mmol/L 27  25  22    Calcium  8.7 - 10.3 mg/dL 9.1  9.3  9.0   Total Protein 6.0 - 8.5 g/dL 6.8  6.9  6.7   Total Bilirubin 0.0 - 1.2 mg/dL 0.3  0.5  0.3   Alkaline Phos 44 - 121 IU/L 108  110  102   AST 0 - 40 IU/L 21  20  18    ALT 0 - 32 IU/L 19  21  20      Lipid Panel     Component Value Date/Time   CHOL 145 10/14/2022 1049   TRIG 99  10/14/2022 1049   HDL 41 10/14/2022 1049   CHOLHDL 4.1 04/15/2021 1014   LDLCALC 86 10/14/2022 1049    CBC    Component Value Date/Time   WBC 10.9 (H) 12/03/2020 1751   RBC 4.56 12/03/2020 1751   HGB 11.9 (L) 12/03/2020 1751   HGB 11.9 10/28/2011 0936   HCT 37.5 12/03/2020 1751   HCT 35.9 10/28/2011 0936   PLT 247 12/03/2020 1751   PLT 202 10/28/2011 0936   MCV 82.2 12/03/2020 1751   MCV 82.3 10/28/2011 0936   MCH 26.1 12/03/2020 1751   MCHC 31.7 12/03/2020 1751   RDW 14.0 12/03/2020 1751   RDW 15.1 (H) 10/28/2011 0936   LYMPHSABS 2.8 12/03/2020 1751   LYMPHSABS 1.8 10/28/2011 0936   MONOABS 0.8 12/03/2020 1751   MONOABS 0.5 10/28/2011 0936   EOSABS 0.1 12/03/2020 1751   EOSABS 0.1 10/28/2011 0936   BASOSABS 0.1 12/03/2020 1751   BASOSABS 0.0 10/28/2011 0936    Lab Results  Component Value Date   HGBA1C 10.0 (A) 01/18/2024    Lab Results  Component Value Date   HGBA1C 10.0 (A) 01/18/2024   HGBA1C 7.3 (A) 04/20/2023   HGBA1C 6.6 10/14/2022    Lab Results  Component Value Date   TSH 0.792 10/14/2022      1. Hypertension associated with diabetes (HCC) Uncontrolled due to the fact that she is yet to take her antihypertensive due to fasting for labs Medications have been refilled Medication adherence emphasized Counseled on blood pressure goal of less than 130/80, low-sodium, DASH diet, medication compliance, 150 minutes of moderate intensity exercise per week. Discussed medication compliance, adverse effects. - amLODipine  (NORVASC ) 10 MG tablet; Take 1 tablet (10 mg total) by mouth daily.  Dispense: 90 tablet; Refill: 1 - lisinopril -hydrochlorothiazide  (ZESTORETIC ) 20-25 MG tablet; Take 1 tablet by mouth daily.  Dispense: 90 tablet; Refill: 1 - metoprolol  succinate (TOPROL -XL) 50 MG 24 hr tablet; Take 1 tablet (50 mg total) by mouth daily.  Dispense: 90 tablet; Refill: 1  2. Type 2 diabetes mellitus with diabetic polyneuropathy, with long-term current use of  insulin  (HCC) Uncontrolled neuropathy likely due to worsening A1c Increase gabapentin  dose from 100 mg 3 times daily to 300 mg 3 times daily Continue Cymbalta  - POCT glycosylated hemoglobin (Hb A1C) - Microalbumin / creatinine urine ratio - atorvastatin  (LIPITOR) 20 MG tablet; Take 1 tablet (20 mg total) by mouth daily.  Dispense: 90 tablet; Refill: 1 - DULoxetine  (CYMBALTA ) 60 MG capsule; Take 1 capsule (60 mg total) by mouth daily. For neuropathy  Dispense: 90 capsule; Refill: 1 - CMP14+EGFR - CBC with Differential/Platelet - LP+Non-HDL Cholesterol - gabapentin  (NEURONTIN ) 300 MG capsule; Take 1 capsule (300 mg total) by mouth 3 (three) times daily.  Dispense: 270 capsule; Refill: 1 - insulin  glargine (LANTUS  SOLOSTAR) 100 UNIT/ML Solostar Pen; Inject 45 Units into the skin daily.  Dispense: 30 mL; Refill: 3  3. Type 2 diabetes mellitus with other specified complication, with long-term current use of insulin  (HCC) Uncontrolled with A1c of 10.0 up from 7.3 previously She has been without Farxiga  which I have refilled I have spoke with the pharmacist regarding her Ozempic  which she has been out of.  She will need to call Medicaid to set up a payment plan for her deductible after which we will restart Ozempic  beginning at the lowest dose and titrate up In the meantime due to uncontrolled diabetes we have raised Lantus  from 35 units to 45 units nightly - dapagliflozin  propanediol (FARXIGA ) 10 MG TABS tablet; Take 1 tablet (10 mg total) by mouth daily before breakfast.  Dispense: 90 tablet; Refill: 1  4. Hypothyroidism, acquired (Primary) Controlled Will send off thyroid  labs and adjust regimen accordingly - T4, free - TSH - T3  5. Estrogen deficiency - DG Bone Density; Future  6. Long term current use of oral hypoglycemic drug See #3 above  7. Long term current use of insulin  (HCC) See #3 above - insulin  glargine (LANTUS  SOLOSTAR) 100 UNIT/ML Solostar Pen; Inject 45 Units into the  skin daily.  Dispense: 30 mL; Refill: 3    Meds ordered this encounter  Medications   amLODipine  (NORVASC ) 10 MG tablet    Sig: Take 1 tablet (10 mg total) by mouth daily.    Dispense:  90 tablet    Refill:  1   atorvastatin  (LIPITOR) 20 MG tablet    Sig: Take 1 tablet (20 mg total) by mouth daily.    Dispense:  90 tablet    Refill:  1   DULoxetine  (CYMBALTA ) 60 MG capsule    Sig: Take 1 capsule (60 mg total) by mouth daily. For neuropathy    Dispense:  90 capsule    Refill:  1   lisinopril -hydrochlorothiazide  (ZESTORETIC ) 20-25 MG tablet    Sig: Take 1 tablet by mouth daily.    Dispense:  90 tablet    Refill:  1   metoprolol  succinate (TOPROL -XL) 50 MG 24 hr tablet    Sig: Take 1 tablet (50 mg total) by mouth daily.    Dispense:  90 tablet    Refill:  1   gabapentin  (NEURONTIN ) 300 MG capsule    Sig: Take 1 capsule (300 mg total) by mouth 3 (three) times daily.    Dispense:  270 capsule    Refill:  1    Dose increase   dapagliflozin  propanediol (FARXIGA ) 10 MG TABS tablet    Sig: Take 1 tablet (10 mg total) by mouth daily before breakfast.    Dispense:  90 tablet    Refill:  1   insulin  glargine (LANTUS  SOLOSTAR) 100 UNIT/ML Solostar Pen    Sig: Inject 45 Units into the skin daily.    Dispense:  30 mL    Refill:  3    Dose increase    Follow-up: Return in about 3 months (around 04/19/2024) for Chronic medical conditions.       Corrina Sabin, MD, FAAFP. [facility] [place], [place] [phone]   01/18/2024, 12:19 PM

## 2024-01-18 NOTE — Telephone Encounter (Signed)
 Hey friend.   This patient has Medicare. She is having trouble setting up her deductible payment plan. Is it possible to have her complete a PASS application for Ozempic ?

## 2024-01-18 NOTE — Patient Instructions (Signed)
 VISIT SUMMARY:  During today's visit, we discussed your ongoing issues with diabetes, diabetic neuropathy, hypertension, and hypothyroidism. We also reviewed your current medications and made some adjustments to better manage your conditions. Additionally, we planned for a bone density study to screen for osteoporosis.  YOUR PLAN:  -TYPE 2 DIABETES MELLITUS: Type 2 diabetes is a condition where your body does not use insulin  properly, leading to high blood sugar levels. Your A1c has increased to 10.0%, indicating poor blood sugar control. We will refill your Farxiga  at 10 mg and increase your Lantus  to 45 units once daily at night. Please call Medicare to set up a payment plan for Ozempic , and if approved, restart it at 0.25 mg and gradually increase the dose. Monitor your blood sugar levels and adjust the Lantus  dose if necessary.  -DIABETIC NEUROPATHY: Diabetic neuropathy is nerve damage caused by high blood sugar levels, leading to numbness and pain. To help manage your symptoms, we will increase your gabapentin  to 300 mg three times daily and recommend using Voltaren gel for topical pain relief.  -HYPERTENSION: Hypertension is high blood pressure. Your blood pressure was elevated today, likely because you missed your medication dose. We will refill your antihypertensive medication for 90 days.  -HYPOTHYROIDISM: Hypothyroidism is a condition where your thyroid  does not produce enough hormones, affecting your metabolism. We need to monitor your thyroid  function to ensure your current levothyroxine  dose is adequate. We will order thyroid  function tests.  -OSTEOPOROSIS SCREENING: Osteoporosis is a condition where bones become weak and brittle. You are due for a bone density study to assess for osteoporosis, so we will order this study.  INSTRUCTIONS:  Please call Medicare to set up a payment plan for Ozempic . If approved, restart Ozempic  at 0.25 mg and gradually increase the dose. Monitor your  blood sugar levels and adjust the Lantus  dose if necessary. We will also order thyroid  function tests and a bone density study. Make sure to follow up with these tests and any additional instructions provided.

## 2024-01-19 ENCOUNTER — Other Ambulatory Visit: Payer: Self-pay

## 2024-01-19 ENCOUNTER — Ambulatory Visit: Payer: Self-pay | Admitting: Family Medicine

## 2024-01-19 DIAGNOSIS — E039 Hypothyroidism, unspecified: Secondary | ICD-10-CM

## 2024-01-19 DIAGNOSIS — Z794 Long term (current) use of insulin: Secondary | ICD-10-CM

## 2024-01-19 MED ORDER — LEVOTHYROXINE SODIUM 125 MCG PO TABS
125.0000 ug | ORAL_TABLET | Freq: Every day | ORAL | 1 refills | Status: DC
  Filled 2024-01-19: qty 30, 30d supply, fill #0
  Filled 2024-02-21: qty 30, 30d supply, fill #1
  Filled 2024-04-03: qty 30, 30d supply, fill #2
  Filled 2024-04-20 – 2024-05-09 (×2): qty 30, 30d supply, fill #3
  Filled 2024-05-31: qty 30, 30d supply, fill #4
  Filled 2024-07-06: qty 30, 30d supply, fill #5

## 2024-01-19 MED ORDER — ATORVASTATIN CALCIUM 40 MG PO TABS
40.0000 mg | ORAL_TABLET | Freq: Every day | ORAL | 1 refills | Status: AC
  Filled 2024-01-19 – 2024-04-20 (×2): qty 90, 90d supply, fill #0

## 2024-01-21 LAB — CMP14+EGFR
ALT: 17 IU/L (ref 0–32)
AST: 16 IU/L (ref 0–40)
Albumin: 4.4 g/dL (ref 3.9–4.9)
Alkaline Phosphatase: 123 IU/L — ABNORMAL HIGH (ref 44–121)
BUN/Creatinine Ratio: 21 (ref 12–28)
BUN: 23 mg/dL (ref 8–27)
Bilirubin Total: 0.4 mg/dL (ref 0.0–1.2)
CO2: 24 mmol/L (ref 20–29)
Calcium: 9.7 mg/dL (ref 8.7–10.3)
Chloride: 96 mmol/L (ref 96–106)
Creatinine, Ser: 1.07 mg/dL — ABNORMAL HIGH (ref 0.57–1.00)
Globulin, Total: 2.9 g/dL (ref 1.5–4.5)
Glucose: 283 mg/dL — ABNORMAL HIGH (ref 70–99)
Potassium: 4 mmol/L (ref 3.5–5.2)
Sodium: 137 mmol/L (ref 134–144)
Total Protein: 7.3 g/dL (ref 6.0–8.5)
eGFR: 58 mL/min/1.73 — ABNORMAL LOW (ref >59)

## 2024-01-21 LAB — LP+NON-HDL CHOLESTEROL
Cholesterol, Total: 286 mg/dL — ABNORMAL HIGH (ref 100–199)
HDL: 42 mg/dL (ref >39)
LDL Chol Calc (NIH): 197 mg/dL — ABNORMAL HIGH (ref 0–99)
Total Non-HDL-Chol (LDL+VLDL): 244 mg/dL — ABNORMAL HIGH (ref 0–129)
Triglycerides: 241 mg/dL — ABNORMAL HIGH (ref 0–149)
VLDL Cholesterol Cal: 47 mg/dL — ABNORMAL HIGH (ref 5–40)

## 2024-01-21 LAB — CBC WITH DIFFERENTIAL/PLATELET
Basophils Absolute: 0.1 x10E3/uL (ref 0.0–0.2)
Basos: 1 %
EOS (ABSOLUTE): 0.1 x10E3/uL (ref 0.0–0.4)
Eos: 1 %
Hematocrit: 43 % (ref 34.0–46.6)
Hemoglobin: 13.4 g/dL (ref 11.1–15.9)
Immature Grans (Abs): 0 x10E3/uL (ref 0.0–0.1)
Immature Granulocytes: 0 %
Lymphocytes Absolute: 2.6 x10E3/uL (ref 0.7–3.1)
Lymphs: 31 %
MCH: 26.6 pg (ref 26.6–33.0)
MCHC: 31.2 g/dL — ABNORMAL LOW (ref 31.5–35.7)
MCV: 85 fL (ref 79–97)
Monocytes Absolute: 0.7 x10E3/uL (ref 0.1–0.9)
Monocytes: 8 %
Neutrophils Absolute: 4.8 x10E3/uL (ref 1.4–7.0)
Neutrophils: 59 %
Platelets: 267 x10E3/uL (ref 150–450)
RBC: 5.04 x10E6/uL (ref 3.77–5.28)
RDW: 13.9 % (ref 11.7–15.4)
WBC: 8.2 x10E3/uL (ref 3.4–10.8)

## 2024-01-21 LAB — T3: T3, Total: 77 ng/dL (ref 71–180)

## 2024-01-21 LAB — MICROALBUMIN / CREATININE URINE RATIO
Creatinine, Urine: 124.1 mg/dL
Microalb/Creat Ratio: 300 mg/g{creat} — ABNORMAL HIGH (ref 0–29)
Microalbumin, Urine: 372.4 ug/mL

## 2024-01-21 LAB — TSH: TSH: 6.11 u[IU]/mL — ABNORMAL HIGH (ref 0.450–4.500)

## 2024-01-21 LAB — T4, FREE: Free T4: 1.24 ng/dL (ref 0.82–1.77)

## 2024-01-26 ENCOUNTER — Other Ambulatory Visit: Payer: Self-pay

## 2024-01-30 ENCOUNTER — Other Ambulatory Visit: Payer: Self-pay

## 2024-01-31 ENCOUNTER — Ambulatory Visit: Admitting: Family Medicine

## 2024-01-31 ENCOUNTER — Other Ambulatory Visit: Payer: Self-pay

## 2024-02-06 ENCOUNTER — Other Ambulatory Visit: Payer: Self-pay

## 2024-02-13 ENCOUNTER — Other Ambulatory Visit: Payer: Self-pay

## 2024-02-14 ENCOUNTER — Other Ambulatory Visit: Payer: Self-pay

## 2024-02-20 ENCOUNTER — Other Ambulatory Visit: Payer: Self-pay

## 2024-02-22 ENCOUNTER — Other Ambulatory Visit: Payer: Self-pay

## 2024-02-27 ENCOUNTER — Other Ambulatory Visit: Payer: Self-pay

## 2024-03-01 ENCOUNTER — Other Ambulatory Visit: Payer: Self-pay

## 2024-03-08 ENCOUNTER — Other Ambulatory Visit: Payer: Self-pay

## 2024-03-12 ENCOUNTER — Other Ambulatory Visit: Payer: Self-pay

## 2024-03-13 ENCOUNTER — Telehealth: Payer: Self-pay

## 2024-03-13 ENCOUNTER — Other Ambulatory Visit: Payer: Self-pay

## 2024-03-13 NOTE — Telephone Encounter (Signed)
 Received notification from [employer] regarding approval for FARXIGA . Patient assistance approved from 03/13/2024 to 07/18/2024.  Medication will ship to [address], [address]  Pt ID: [id number]  Company phone: [phone]

## 2024-03-22 ENCOUNTER — Ambulatory Visit (HOSPITAL_COMMUNITY): Admission: EM | Admit: 2024-03-22 | Discharge: 2024-03-22 | Disposition: A | Discharge status: Home / Self Care

## 2024-03-22 ENCOUNTER — Encounter (HOSPITAL_COMMUNITY): Payer: Self-pay | Admitting: Emergency Medicine

## 2024-03-22 DIAGNOSIS — M79604 Pain in right leg: Secondary | ICD-10-CM | POA: Diagnosis not present

## 2024-03-22 DIAGNOSIS — M79661 Pain in right lower leg: Secondary | ICD-10-CM | POA: Diagnosis not present

## 2024-03-22 NOTE — ED Triage Notes (Addendum)
 Pt c/o RLE muscle cramping/spasm from knee down that started last week. Tried magnesium oil, epsom salt, Vaseline rubs without relief. Denies any falls or injury.

## 2024-03-22 NOTE — Discharge Instructions (Addendum)
 Get the ultrasound of your right lower leg today Follow instructions after ultrasound testing is complete Follow-up with primary care provider or return here if there are new symptoms  If you have any shortness of breath you should go to the emergency department immediately

## 2024-03-22 NOTE — ED Provider Notes (Signed)
 [facility]    CSN: [id number] Arrival date & time: 03/22/24  1108      History   Chief Complaint Chief Complaint  Patient presents with   Spasms    HPI Brenda [name] is a 63 y.o. female.   Patient is a 63 year old female here who presents with a week of greater right lower calf tenderness and pain.  She states that it started suddenly.  She denies any injuries, traumas, falls or accidents.  She states that the pain is a crampy type feeling that goes from her upper calf down to her lower calf.  She denies any tingling or numbness.  She feels like it might be a little swollen.  She denies any shortness of breath, fevers.  She denies any history of DVTs.      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      Home Medications    Prior to Admission medications   Medication Sig Start Date End Date Taking? Authorizing Provider   amLODipine  (NORVASC ) 10 MG tablet Take 1 tablet (10 mg total) by mouth daily. 01/18/24   Newlin, Enobong, MD  atorvastatin  (LIPITOR) 40 MG tablet Take 1 tablet (40 mg total) by mouth daily. 01/19/24   Newlin, Enobong, MD  Blood Glucose Monitoring Suppl (TRUE METRIX METER) w/Device KIT Use 3 times daily to check blood sugars 06/21/19   Fulp, Cammie, MD  dapagliflozin  propanediol (FARXIGA ) 10 MG TABS tablet Take 1 tablet (10 mg total) by mouth daily before breakfast. 01/18/24   Newlin, Enobong, MD  dicyclomine  (BENTYL ) 10 MG capsule Take 1 capsule (10 mg total) by mouth 2 (two) times daily as needed for spasms. 04/20/23   Newlin, Enobong, MD  DULoxetine  (CYMBALTA ) 60 MG capsule Take 1 capsule (60 mg total) by mouth daily. For neuropathy 01/18/24   Delbert Clam, MD  fluticasone  (FLONASE ) 50 MCG/ACT nasal spray Place 2 sprays into both nostrils daily. 10/13/23   Newlin, Enobong, MD  gabapentin  (NEURONTIN ) 300 MG capsule Take 1 capsule (300 mg total) by mouth 3 (three) times daily. 01/18/24   Newlin, Enobong, MD  glucose blood (TRUE METRIX BLOOD GLUCOSE TEST)  test strip Use as instructed to check blood sugars 3 times daily 06/21/19   Fulp, Cammie, MD  insulin  glargine (LANTUS  SOLOSTAR) 100 UNIT/ML Solostar Pen Inject 45 Units into the skin daily. 01/18/24   Newlin, Enobong, MD  Insulin  Pen Needle (TRUEPLUS 5-BEVEL PEN NEEDLES) 31G X 5 MM MISC Use as directed. 04/29/23   Newlin, Enobong, MD  levothyroxine  (SYNTHROID ) 125 MCG tablet Take 1 tablet (125 mcg total) by mouth daily before breakfast. 01/19/24   Newlin, Enobong, MD  lisinopril -hydrochlorothiazide  (ZESTORETIC ) 20-25 MG tablet Take 1 tablet by mouth daily. 01/18/24   Newlin, Enobong, MD  metoprolol  succinate (TOPROL -XL) 50 MG 24 hr tablet Take 1 tablet (50 mg total) by mouth daily. 01/18/24   Newlin, Enobong, MD  promethazine -dextromethorphan (PROMETHAZINE -DM) 6.25-15 MG/5ML syrup Take 5 mLs by mouth 4 (four) times daily as needed for cough. 11/27/22   Dreama,  [place]  N, FNP  Semaglutide , 2 MG/DOSE, (OZEMPIC , 2 MG/DOSE,) 8 MG/3ML SOPN Inject 2 mg as directed once a week. Patient not taking: Reported on 01/18/2024 04/20/23   Delbert Clam, MD  TRUEplus Lancets 28G MISC Use to help check blood sugars 3 times daily 06/21/19   Alec House, MD    Family History Family History  Problem Relation Age of Onset   Colon polyps Mother    Diabetes Father    Heart disease Father    Hypertension Father    Kidney disease Father    Diabetes Sister    Diabetes Brother    Colon cancer Paternal Grandmother 15   Breast cancer Paternal Aunt    Breast cancer Cousin    Breast cancer Cousin    Esophageal cancer Neg Hx    Rectal cancer Neg Hx    Stomach cancer Neg Hx     Social History Social History   Tobacco Use   Smoking status: Former    Current packs/day: 0.00    Types: Cigarettes    Quit date: 04/05/1981    Years since quitting: 42.9   Smokeless tobacco: Never  Vaping Use   Vaping status: Never Used  Substance Use Topics   Alcohol use: No   Drug use: No     Allergies   Patient has no known allergies.   Review of Systems Review of Systems  All other systems reviewed and are negative.    Physical Exam Triage Vital Signs ED Triage Vitals  Encounter Vitals Group     BP 03/22/24 1134 119/74     Girls Systolic BP Percentile --      Girls Diastolic BP Percentile --      Boys Systolic BP Percentile --      Boys Diastolic BP Percentile --      Pulse Rate 03/22/24 1134 67     Resp 03/22/24 1134 18     Temp 03/22/24 1134 98.4 F (36.9 C)     Temp Source 03/22/24 1134 Oral     SpO2 03/22/24 1134 96 %     Weight --      Height --      Head Circumference --      Peak Flow --      Pain Score 03/22/24 1133 10     Pain Loc --      Pain Education --      Exclude from Growth Chart --    No data found.  Updated Vital Signs BP 119/74 (BP Location: Left Arm)   Pulse 67   Temp 98.4 F (36.9 C) (Oral)  Resp 18   SpO2 96%   Visual  Acuity Right Eye Distance:   Left Eye Distance:   Bilateral Distance:    Right Eye Near:   Left Eye Near:    Bilateral Near:     Physical Exam Constitutional:      Appearance: Normal appearance.  HENT:     Head: Normocephalic and atraumatic.  Eyes:     Extraocular Movements: Extraocular movements intact.     Pupils: Pupils are equal, round, and reactive to light.  Cardiovascular:     Rate and Rhythm: Normal rate and regular rhythm.  Pulmonary:     Effort: Pulmonary effort is normal. No respiratory distress.     Breath sounds: Normal breath sounds. No stridor. No wheezing or rhonchi.  Musculoskeletal:     Comments: Patient's right lower extremity is free from erythema or warmth but is tight and slightly edematous.  There is tenderness to palpation over the proximal calf, extending down the distal calf.  No tenderness to palpation over the knee.  No skin findings around the calf.  Patient has intact sensation on the way down to the toes, DP and PT pulses are felt equal and reactive.  No cyanosis.  Neurological:     Mental Status: She is alert.      UC Treatments / Results  Labs (all labs ordered are listed, but only abnormal results are displayed) Labs Reviewed - No data to display  EKG   Radiology No results found.  Procedures Procedures (including critical care time)  Medications Ordered in UC Medications - No data to display  Initial Impression / Assessment and Plan / UC Course  I have reviewed the triage vital signs and the nursing notes.  Pertinent labs & imaging results that were available during my care of the patient were reviewed by me and considered in my medical decision making (see chart for details).     - Patient has sudden and worsening right lower calf pain  - First and foremost, she was ruled out for DVT - Her distal pulses are equal and reactive.  Her sensation is intact. - Will send for lower extremity ultrasound to be done as it is  possible - Discussed signs of respiratory issues and shortness of breath with patient and when to visit the emergency department - She should follow-up with her PCP or here at the urgent care with any new changes  Final Clinical Impressions(s) / UC Diagnoses   Final diagnoses:  Pain in posterior right lower extremity  Tenderness of right calf     Discharge Instructions      Get the ultrasound of your right lower leg today Follow instructions after ultrasound testing is complete Follow-up with primary care provider or return here if there are new symptoms  If you have any shortness of breath you should go to the emergency department immediately    ED Prescriptions   None    PDMP not reviewed this encounter.   Jilda Krystal HERO, DO 03/22/24 1201

## 2024-03-23 ENCOUNTER — Ambulatory Visit (HOSPITAL_COMMUNITY)
Admission: RE | Admit: 2024-03-23 | Discharge: 2024-03-23 | Disposition: A | Discharge status: Home / Self Care | Source: Ambulatory Visit

## 2024-03-23 ENCOUNTER — Ambulatory Visit (HOSPITAL_COMMUNITY): Payer: Self-pay

## 2024-03-23 DIAGNOSIS — M79604 Pain in right leg: Secondary | ICD-10-CM | POA: Diagnosis not present

## 2024-03-23 DIAGNOSIS — R0602 Shortness of breath: Secondary | ICD-10-CM | POA: Insufficient documentation

## 2024-03-23 DIAGNOSIS — R252 Cramp and spasm: Secondary | ICD-10-CM | POA: Diagnosis not present

## 2024-03-26 ENCOUNTER — Other Ambulatory Visit: Payer: Self-pay

## 2024-03-27 ENCOUNTER — Other Ambulatory Visit: Payer: Self-pay

## 2024-04-02 ENCOUNTER — Other Ambulatory Visit: Payer: Self-pay

## 2024-04-03 ENCOUNTER — Other Ambulatory Visit: Payer: Self-pay

## 2024-04-04 ENCOUNTER — Other Ambulatory Visit: Payer: Self-pay

## 2024-04-10 ENCOUNTER — Other Ambulatory Visit: Payer: Self-pay

## 2024-04-10 DIAGNOSIS — K080 Exfoliation of teeth due to systemic causes: Secondary | ICD-10-CM | POA: Diagnosis not present

## 2024-04-10 NOTE — Progress Notes (Signed)
 Brenda [name]                                          MRN: [id number]   04/10/2024   The [facility] Quality Team Specialist reviewed this patient medical record for the purposes of chart review for care gap closure. The following were reviewed: chart review for care gap closure-diabetic eye exam.    [facility] Quality Team

## 2024-04-12 DIAGNOSIS — E113593 Type 2 diabetes mellitus with proliferative diabetic retinopathy without macular edema, bilateral: Secondary | ICD-10-CM | POA: Diagnosis not present

## 2024-04-12 DIAGNOSIS — H26493 Other secondary cataract, bilateral: Secondary | ICD-10-CM | POA: Diagnosis not present

## 2024-04-12 DIAGNOSIS — H31093 Other chorioretinal scars, bilateral: Secondary | ICD-10-CM | POA: Diagnosis not present

## 2024-04-18 ENCOUNTER — Telehealth: Payer: Self-pay | Admitting: Family Medicine

## 2024-04-18 NOTE — Telephone Encounter (Signed)
 Called pt to confirm appt.Pt did not answer and could not LVM.

## 2024-04-19 ENCOUNTER — Other Ambulatory Visit: Payer: Self-pay | Admitting: Pharmacist

## 2024-04-19 ENCOUNTER — Other Ambulatory Visit: Payer: Self-pay

## 2024-04-19 MED ORDER — SEMAGLUTIDE (1 MG/DOSE) 4 MG/3ML ~~LOC~~ SOPN
1.0000 mg | PEN_INJECTOR | SUBCUTANEOUS | 0 refills | Status: DC
  Filled 2024-04-19: qty 3, 28d supply, fill #0

## 2024-04-19 MED ORDER — OZEMPIC (0.25 OR 0.5 MG/DOSE) 2 MG/3ML ~~LOC~~ SOPN
0.5000 mg | PEN_INJECTOR | SUBCUTANEOUS | 0 refills | Status: DC
  Filled 2024-04-19: qty 3, 28d supply, fill #0

## 2024-04-20 ENCOUNTER — Telehealth: Payer: Self-pay | Admitting: Family Medicine

## 2024-04-20 ENCOUNTER — Other Ambulatory Visit: Payer: Self-pay | Admitting: Family Medicine

## 2024-04-20 DIAGNOSIS — K582 Mixed irritable bowel syndrome: Secondary | ICD-10-CM

## 2024-04-20 NOTE — Telephone Encounter (Signed)
 1st attempt contacted pt mailbox full to resch appt pcp not in the office

## 2024-04-22 MED ORDER — DICYCLOMINE HCL 10 MG PO CAPS
10.0000 mg | ORAL_CAPSULE | Freq: Two times a day (BID) | ORAL | 1 refills | Status: AC | PRN
  Filled 2024-04-22: qty 180, 90d supply, fill #0

## 2024-04-23 ENCOUNTER — Other Ambulatory Visit: Payer: Self-pay

## 2024-04-23 ENCOUNTER — Telehealth: Payer: Self-pay | Admitting: Family Medicine

## 2024-04-23 ENCOUNTER — Ambulatory Visit: Admitting: Family Medicine

## 2024-04-23 NOTE — Telephone Encounter (Signed)
 Copied from CRM [id number]. Topic: General - Other >> Apr 23, 2024 12:57 PM Wess RAMAN wrote:  Reason for CRM: Nat from [facility] will be sending medication approval via fax for dicyclomine  (BENTYL ) 10 MG capsule, set for 1 year  Callback #: [phone] opt 5

## 2024-04-23 NOTE — Telephone Encounter (Signed)
 Noted

## 2024-04-23 NOTE — Telephone Encounter (Signed)
 FYI, Fax received.

## 2024-04-25 ENCOUNTER — Other Ambulatory Visit: Payer: Self-pay

## 2024-04-27 ENCOUNTER — Other Ambulatory Visit: Payer: Self-pay

## 2024-05-01 ENCOUNTER — Other Ambulatory Visit: Payer: Self-pay

## 2024-05-03 ENCOUNTER — Other Ambulatory Visit: Payer: Self-pay

## 2024-05-09 ENCOUNTER — Other Ambulatory Visit: Payer: Self-pay

## 2024-05-11 ENCOUNTER — Telehealth: Payer: Self-pay

## 2024-05-11 NOTE — Telephone Encounter (Signed)
 Called patient unable to make contact, voicemail has been left to get appointment for Brenda [name]  10/31

## 2024-05-31 ENCOUNTER — Other Ambulatory Visit: Payer: Self-pay | Admitting: Family Medicine

## 2024-05-31 ENCOUNTER — Other Ambulatory Visit: Payer: Self-pay

## 2024-06-01 ENCOUNTER — Other Ambulatory Visit: Payer: Self-pay

## 2024-06-04 ENCOUNTER — Other Ambulatory Visit: Payer: Self-pay

## 2024-06-05 DIAGNOSIS — H26493 Other secondary cataract, bilateral: Secondary | ICD-10-CM | POA: Diagnosis not present

## 2024-06-11 ENCOUNTER — Other Ambulatory Visit: Payer: Self-pay

## 2024-06-11 ENCOUNTER — Other Ambulatory Visit: Payer: Self-pay | Admitting: Pharmacist

## 2024-06-11 ENCOUNTER — Other Ambulatory Visit: Payer: Self-pay | Admitting: Family Medicine

## 2024-06-11 MED ORDER — SEMAGLUTIDE (1 MG/DOSE) 4 MG/3ML ~~LOC~~ SOPN
1.0000 mg | PEN_INJECTOR | SUBCUTANEOUS | 1 refills | Status: DC
  Filled 2024-06-11 – 2024-06-13 (×2): qty 3, 28d supply, fill #0
  Filled 2024-07-06: qty 3, 28d supply, fill #1

## 2024-06-11 NOTE — Progress Notes (Signed)
 Pharmacy Quality Measure Review  This patient is appearing on a report for being at risk of failing the Glycemic Status Assessment in Diabetes measure this calendar year.   Last documented A1c on 01/18/24 was 10%. From chart review, it looks like pharmacy has tried to assist with Ozempic  coverage. She has not filled Ozempic  since 04/23/2024. Has not filled Lantus  since 04/04/2024. I was unable to connect with her today so left a HIPAA-compliant VM with instructions to return my call.   Brenda [name], PharmD, JAQUELINE, CPP Clinical Pharmacist [facility] [phone]

## 2024-06-11 NOTE — Telephone Encounter (Signed)
 Copied from CRM #[id number]. Topic: Clinical - Medication Refill >> Jun 11, 2024 10:27 AM Willma R wrote: Medication: Semaglutide , 2 MG/DOSE, (OZEMPIC , 2 MG/DOSE,) 8 MG/3ML SOPN  Has the patient contacted their pharmacy? Yes, call dr  This is the patient's preferred pharmacy:  [facility] [address] [place] [address] Phone: [phone] Fax: [phone]  Is this the correct pharmacy for this prescription? Yes If no, delete pharmacy and type the correct one.   Has the prescription been filled recently? No  Is the patient out of the medication? Yes  Has the patient been seen for an appointment in the last year OR does the patient have an upcoming appointment? Yes  Can we respond through MyChart? Yes  Agent: Please be advised that Rx refills may take up to 3 business days. We ask that you follow-up with your pharmacy.

## 2024-06-12 ENCOUNTER — Other Ambulatory Visit: Payer: Self-pay | Admitting: Family Medicine

## 2024-06-12 MED ORDER — OZEMPIC (2 MG/DOSE) 8 MG/3ML ~~LOC~~ SOPN
2.0000 mg | PEN_INJECTOR | SUBCUTANEOUS | 6 refills | Status: AC
  Filled 2024-06-12 – 2024-07-06 (×2): qty 3, 28d supply, fill #0
  Filled 2024-08-01 – 2024-08-22 (×2): qty 3, 28d supply, fill #1

## 2024-06-13 ENCOUNTER — Other Ambulatory Visit: Payer: Self-pay

## 2024-06-18 ENCOUNTER — Encounter: Admitting: Family Medicine

## 2024-06-25 DIAGNOSIS — H26492 Other secondary cataract, left eye: Secondary | ICD-10-CM | POA: Diagnosis not present

## 2024-06-25 DIAGNOSIS — Z961 Presence of intraocular lens: Secondary | ICD-10-CM | POA: Diagnosis not present

## 2024-06-29 ENCOUNTER — Telehealth: Payer: Self-pay

## 2024-06-29 NOTE — Telephone Encounter (Signed)
 Patient was identified as falling into the [place] Measure - Diabetes.   Patient was: Appointment already scheduled for:  08/02/2024.

## 2024-07-06 ENCOUNTER — Other Ambulatory Visit: Payer: Self-pay

## 2024-07-11 ENCOUNTER — Other Ambulatory Visit: Payer: Self-pay

## 2024-07-16 DIAGNOSIS — K080 Exfoliation of teeth due to systemic causes: Secondary | ICD-10-CM | POA: Diagnosis not present

## 2024-07-20 ENCOUNTER — Other Ambulatory Visit: Payer: Self-pay | Admitting: Family Medicine

## 2024-07-20 ENCOUNTER — Other Ambulatory Visit: Payer: Self-pay

## 2024-07-20 DIAGNOSIS — E1159 Type 2 diabetes mellitus with other circulatory complications: Secondary | ICD-10-CM

## 2024-07-20 MED ORDER — AMLODIPINE BESYLATE 10 MG PO TABS
10.0000 mg | ORAL_TABLET | Freq: Every day | ORAL | 0 refills | Status: AC
  Filled 2024-07-20: qty 90, 90d supply, fill #0

## 2024-07-20 MED ORDER — METOPROLOL SUCCINATE ER 50 MG PO TB24
50.0000 mg | ORAL_TABLET | Freq: Every day | ORAL | 0 refills | Status: DC
  Filled 2024-07-20: qty 90, 90d supply, fill #0

## 2024-07-24 ENCOUNTER — Other Ambulatory Visit: Payer: Self-pay

## 2024-07-24 ENCOUNTER — Other Ambulatory Visit: Payer: Self-pay | Admitting: Family Medicine

## 2024-07-24 DIAGNOSIS — I152 Hypertension secondary to endocrine disorders: Secondary | ICD-10-CM

## 2024-07-24 MED ORDER — LISINOPRIL-HYDROCHLOROTHIAZIDE 20-25 MG PO TABS
1.0000 | ORAL_TABLET | Freq: Every day | ORAL | 0 refills | Status: DC
  Filled 2024-07-24: qty 30, 30d supply, fill #0

## 2024-07-25 ENCOUNTER — Other Ambulatory Visit: Payer: Self-pay

## 2024-08-01 ENCOUNTER — Telehealth: Payer: Self-pay | Admitting: Family Medicine

## 2024-08-01 ENCOUNTER — Other Ambulatory Visit: Payer: Self-pay | Admitting: Family Medicine

## 2024-08-01 DIAGNOSIS — E1142 Type 2 diabetes mellitus with diabetic polyneuropathy: Secondary | ICD-10-CM

## 2024-08-01 NOTE — Telephone Encounter (Signed)
 Contacted pt to confirmed appt (per vr)

## 2024-08-02 ENCOUNTER — Ambulatory Visit: Attending: Family Medicine | Admitting: Family Medicine

## 2024-08-02 ENCOUNTER — Encounter: Payer: Self-pay | Admitting: Family Medicine

## 2024-08-02 ENCOUNTER — Other Ambulatory Visit: Payer: Self-pay

## 2024-08-02 VITALS — BP 159/83 | HR 71 | Temp 98.3°F | Ht 68.5 in | Wt 247.2 lb

## 2024-08-02 DIAGNOSIS — E1169 Type 2 diabetes mellitus with other specified complication: Secondary | ICD-10-CM

## 2024-08-02 DIAGNOSIS — Z860100 Personal history of colon polyps, unspecified: Secondary | ICD-10-CM | POA: Diagnosis not present

## 2024-08-02 DIAGNOSIS — I152 Hypertension secondary to endocrine disorders: Secondary | ICD-10-CM

## 2024-08-02 DIAGNOSIS — M79641 Pain in right hand: Secondary | ICD-10-CM | POA: Diagnosis not present

## 2024-08-02 DIAGNOSIS — E1159 Type 2 diabetes mellitus with other circulatory complications: Secondary | ICD-10-CM | POA: Diagnosis not present

## 2024-08-02 DIAGNOSIS — E1142 Type 2 diabetes mellitus with diabetic polyneuropathy: Secondary | ICD-10-CM

## 2024-08-02 DIAGNOSIS — Z7984 Long term (current) use of oral hypoglycemic drugs: Secondary | ICD-10-CM

## 2024-08-02 DIAGNOSIS — Z7985 Long-term (current) use of injectable non-insulin antidiabetic drugs: Secondary | ICD-10-CM

## 2024-08-02 DIAGNOSIS — E119 Type 2 diabetes mellitus without complications: Secondary | ICD-10-CM | POA: Diagnosis not present

## 2024-08-02 DIAGNOSIS — E785 Hyperlipidemia, unspecified: Secondary | ICD-10-CM

## 2024-08-02 DIAGNOSIS — E039 Hypothyroidism, unspecified: Secondary | ICD-10-CM | POA: Diagnosis not present

## 2024-08-02 DIAGNOSIS — Z794 Long term (current) use of insulin: Secondary | ICD-10-CM

## 2024-08-02 LAB — POCT GLYCOSYLATED HEMOGLOBIN (HGB A1C): HbA1c, POC (controlled diabetic range): 8.9 % — AB (ref 0.0–7.0)

## 2024-08-02 MED ORDER — LANTUS SOLOSTAR 100 UNIT/ML ~~LOC~~ SOPN
52.0000 [IU] | PEN_INJECTOR | Freq: Every day | SUBCUTANEOUS | 6 refills | Status: AC
  Filled 2024-08-02: qty 15, 28d supply, fill #0

## 2024-08-02 MED ORDER — GABAPENTIN 300 MG PO CAPS
ORAL_CAPSULE | ORAL | 1 refills | Status: AC
  Filled 2024-08-02: qty 360, fill #0

## 2024-08-02 MED ORDER — METOPROLOL SUCCINATE ER 50 MG PO TB24
50.0000 mg | ORAL_TABLET | Freq: Every day | ORAL | 1 refills | Status: AC
  Filled 2024-08-02: qty 90, 90d supply, fill #0

## 2024-08-02 MED ORDER — PREDNISONE 20 MG PO TABS
20.0000 mg | ORAL_TABLET | Freq: Every day | ORAL | 0 refills | Status: AC
  Filled 2024-08-02: qty 5, 5d supply, fill #0

## 2024-08-02 MED ORDER — DULOXETINE HCL 60 MG PO CPEP
60.0000 mg | ORAL_CAPSULE | Freq: Every day | ORAL | 1 refills | Status: AC
  Filled 2024-08-02: qty 90, 90d supply, fill #0

## 2024-08-02 MED ORDER — LISINOPRIL-HYDROCHLOROTHIAZIDE 20-25 MG PO TABS
1.0000 | ORAL_TABLET | Freq: Every day | ORAL | 1 refills | Status: AC
  Filled 2024-08-02: qty 90, 90d supply, fill #0

## 2024-08-02 NOTE — Patient Instructions (Signed)
 VISIT SUMMARY:  During your visit, we discussed your right hand pain, elevated blood pressure, diabetes management, and general health maintenance. We have made some adjustments to your medications and ordered tests to better understand your symptoms and improve your overall health.  YOUR PLAN:  -RIGHT HAND PAIN AND SWELLING: You have been experiencing severe pain and swelling in your right hand since July. We are evaluating for possible gout and neuropathy. We have ordered a blood test for uric acid, an x-ray of your right hand, and a complete blood count. You have been prescribed prednisone  for inflammation and your gabapentin  dose has been adjusted to 600 mg in the evening. Based on the test results, we may refer you to an orthopedic specialist.  -TYPE 2 DIABETES MELLITUS WITH DIABETIC POLYNEUROPATHY: Your blood sugar levels have been high, likely due to increased lemonade consumption. Your A1c has improved from 10.0% to 8.9%. We have increased your Lantus  dose from 45 units to 52 units and you should continue taking the maximum dose of Ozempic . Please reduce your lemonade consumption to help manage your blood sugar levels.  -HYPERTENSION: Your blood pressure has been elevated, likely due to stress. Your previous reading was 119/74 mmHg. We will reassess your blood pressure in three months.  -HYPERLIPIDEMIA: Your cholesterol levels have been previously uncontrolled. We have ordered a lipid panel and will adjust your atorvastatin  dose based on the results. Please follow a low cholesterol diet to help manage your cholesterol levels.  -GENERAL HEALTH MAINTENANCE: You are due for a colonoscopy screening as your last one in 2022 showed polyps. We have placed a referral for you to get a colonoscopy.  INSTRUCTIONS:  Please follow up with the recommended tests and referrals. We will reassess your blood pressure in three months and adjust your medications based on the test results. Continue to monitor  your blood sugar levels and reduce your lemonade consumption. Follow a low cholesterol diet and schedule your colonoscopy screening as soon as possible.

## 2024-08-02 NOTE — Progress Notes (Signed)
 "  Subjective:  Patient ID: Brenda [name], female    DOB: Jun 26, 1961  Age: 64 y.o. MRN: [id number]  CC: Medical Management of Chronic Issues (Pain in right hand)     Discussed the use of AI scribe software for clinical note transcription with the patient, who gave verbal consent to proceed.  History of Present Illness Brenda [name] is a 63 year old female with a history of  Type 2 Diabetes Mellitus (A1c 6.6), Hypertension, hypothyroidism, obesity  who presents with right hand pain and elevated blood pressure.  She has persistent severe right hand pain since July 2025, described as a bruised sensation without visible bruising. Pain sometimes radiates up the arm and is worsened by light touch, including bed covers, which disrupts her sleep. She denies numbness, tingling, or dropping objects.  Her hypertension is usually controlled at home with readings around the 140s. She had a recent elevated reading that she attributes to stress while parking. She notes she can feel BP elevation as pressure in her head.  Her diabetes has been running high, which she links to drinking lemonade. She takes Lantus  45 units nightly and Ozempic  2 mg weekly. She takes gabapentin  three times daily for pain without sedation.  A1c is 8.9 slightly down from 10.0 previously.  She has prior colon polyps and is overdue for colonoscopy for colon cancer screening, which she has not yet completed.    Past Medical History:  Diagnosis Date   Arthritis    Colon cancer (HCC) 09/08/2009   Colonoscopy by Dr Kristie; Surgery by Dr Ebbie   Diabetes mellitus    Family history of breast cancer    aunt and cousin on father's side   Family history of colon cancer    grandmother - father's side   Family history of prostate cancer    uncle - (father's brother)   Fatigue    Headache(784.0)    Hemorrhoid    Hyperlipidemia    Hypertension    Neuropathy    feet   Night sweats    Thyroid  disease    Wears glasses      Past Surgical History:  Procedure Laterality Date   ABDOMINAL HYSTERECTOMY     APPENDECTOMY     COLONOSCOPY  05/14/2011   Dr.Pyrtle   CYST REMOVED     BACK OF HEAD   RIGHT COLECTOMY  09/09/2009   FOR COLON CANCER   THYROID  SURGERY  09/02/2010   Clarify with patient; BENIGN PER PT   TUBAL LIGATION      Family History  Problem Relation Age of Onset   Colon polyps Mother    Diabetes Father    Heart disease Father    Hypertension Father    Kidney disease Father    Diabetes Sister    Diabetes Brother    Colon cancer Paternal Grandmother 81   Breast cancer Paternal Aunt    Breast cancer Cousin    Breast cancer Cousin    Esophageal cancer Neg Hx    Rectal cancer Neg Hx    Stomach cancer Neg Hx     Social History   Socioeconomic History   Marital status: Married    Spouse name: Not on file   Number of children: Not on file   Years of education: Not on file   Highest education level: Master's degree (e.g., MA, MS, MEng, MEd, MSW, MBA)  Occupational History   Not on file  Tobacco Use   Smoking status: Former  Current packs/day: 0.00    Types: Cigarettes    Quit date: 04/05/1981    Years since quitting: 43.3   Smokeless tobacco: Never  Vaping Use   Vaping status: Never Used  Substance and Sexual Activity   Alcohol use: No   Drug use: No   Sexual activity: Not Currently  Other Topics Concern   Not on file  Social History Narrative   Not on file   Social Drivers of Health   Tobacco Use: Medium Risk (08/03/2024)   Patient History    Smoking Tobacco Use: Former    Smokeless Tobacco Use: Never    Passive Exposure: Not on file  Financial Resource Strain: Low Risk (08/01/2024)   Overall Financial Resource Strain (CARDIA)    Difficulty of Paying Living Expenses: Not very hard  Food Insecurity: No Food Insecurity (08/01/2024)   Epic    Worried About Programme Researcher, Broadcasting/film/video in the Last Year: Never true    Ran Out of Food in the Last Year: Never true   Transportation Needs: No Transportation Needs (08/01/2024)   Epic    Lack of Transportation (Medical): No    Lack of Transportation (Non-Medical): No  Physical Activity: Insufficiently Active (08/01/2024)   Exercise Vital Sign    Days of Exercise per Week: 4 days    Minutes of Exercise per Session: 20 min  Stress: No Stress Concern Present (08/01/2024)   [employer] of Occupational Health - Occupational Stress Questionnaire    Feeling of Stress: Only a little  Social Connections: Socially Integrated (08/01/2024)   Social Connection and Isolation Panel    Frequency of Communication with Friends and Family: Twice a week    Frequency of Social Gatherings with Friends and Family: Once a week    Attends Religious Services: More than 4 times per year    Active Member of Clubs or Organizations: Yes    Attends Banker Meetings: More than 4 times per year    Marital Status: Married  Depression (PHQ2-9): Low Risk (01/18/2024)   Depression (PHQ2-9)    PHQ-2 Score: 2  Alcohol Screen: Low Risk (10/14/2022)   Alcohol Screen    Last Alcohol Screening Score (AUDIT): 0  Housing: Low Risk (08/01/2024)   Epic    Unable to Pay for Housing in the Last Year: No    Number of Times Moved in the Last Year: 0    Homeless in the Last Year: No  Utilities: Not At Risk (10/14/2022)   AHC Utilities    Threatened with loss of utilities: No  Health Literacy: Not on file    Allergies[1]  Outpatient Medications Prior to Visit  Medication Sig Dispense Refill   amLODipine  (NORVASC ) 10 MG tablet Take 1 tablet (10 mg total) by mouth daily. 90 tablet 0   atorvastatin  (LIPITOR) 40 MG tablet Take 1 tablet (40 mg total) by mouth daily. 90 tablet 1   Blood Glucose Monitoring Suppl (TRUE METRIX METER) w/Device KIT Use 3 times daily to check blood sugars 1 kit 0   dapagliflozin  propanediol (FARXIGA ) 10 MG TABS tablet Take 1 tablet (10 mg total) by mouth daily before breakfast. 90 tablet 1   fluticasone   (FLONASE ) 50 MCG/ACT nasal spray Place 2 sprays into both nostrils daily. 16 g 0   glucose blood (TRUE METRIX BLOOD GLUCOSE TEST) test strip Use as instructed to check blood sugars 3 times daily 100 each 12   Insulin  Pen Needle (TRUEPLUS 5-BEVEL PEN NEEDLES) 31G X 5 MM MISC  Use as directed. 100 each 2   levothyroxine  (SYNTHROID ) 125 MCG tablet Take 1 tablet (125 mcg total) by mouth daily before breakfast. 90 tablet 1   Semaglutide , 2 MG/DOSE, (OZEMPIC , 2 MG/DOSE,) 8 MG/3ML SOPN Inject 2 mg as directed once a week. 3 mL 6   TRUEplus Lancets 28G MISC Use to help check blood sugars 3 times daily 1100 each 11   DULoxetine  (CYMBALTA ) 60 MG capsule Take 1 capsule (60 mg total) by mouth daily. For neuropathy 90 capsule 1   gabapentin  (NEURONTIN ) 300 MG capsule Take 1 capsule (300 mg total) by mouth 3 (three) times daily. 270 capsule 1   insulin  glargine (LANTUS  SOLOSTAR) 100 UNIT/ML Solostar Pen Inject 45 Units into the skin daily. 30 mL 3   lisinopril -hydrochlorothiazide  (ZESTORETIC ) 20-25 MG tablet Take 1 tablet by mouth daily.Must have office visit for refills 30 tablet 0   metoprolol  succinate (TOPROL -XL) 50 MG 24 hr tablet Take 1 tablet (50 mg total) by mouth daily. 90 tablet 0   Semaglutide , 1 MG/DOSE, 4 MG/3ML SOPN Inject 1 mg as directed once a week. For 4 weeks. Then, increase to 2 mg weekly thereafter. 3 mL 1   dicyclomine  (BENTYL ) 10 MG capsule Take 1 capsule (10 mg total) by mouth 2 (two) times daily as needed for spasms. (Patient not taking: Reported on 08/02/2024) 180 capsule 1   promethazine -dextromethorphan (PROMETHAZINE -DM) 6.25-15 MG/5ML syrup Take 5 mLs by mouth 4 (four) times daily as needed for cough. 118 mL 0   No facility-administered medications prior to visit.     ROS Review of Systems  Constitutional:  Negative for activity change and appetite change.  HENT:  Negative for sinus pressure and sore throat.   Respiratory:  Negative for chest tightness, shortness of breath and  wheezing.   Cardiovascular:  Negative for chest pain and palpitations.  Gastrointestinal:  Negative for abdominal distention, abdominal pain and constipation.  Genitourinary: Negative.   Musculoskeletal:        See HPI  Psychiatric/Behavioral:  Negative for behavioral problems and dysphoric mood.     Objective:  BP (!) 159/83   Pulse 71   Temp 98.3 F (36.8 C) (Oral)   Ht 5' 8.5 (1.74 m)   Wt 247 lb 3.2 oz (112.1 kg)   SpO2 98%   BMI 37.04 kg/m      08/02/2024    8:40 AM 03/22/2024   11:34 AM 01/18/2024   12:13 PM  BP/Weight  Systolic BP 159 119 155  Diastolic BP 83 74 74  Wt. (Lbs) 247.2    BMI 37.04 kg/m2        Physical Exam Constitutional:      Appearance: She is well-developed.  Cardiovascular:     Rate and Rhythm: Normal rate.     Heart sounds: Normal heart sounds. No murmur heard. Pulmonary:     Effort: Pulmonary effort is normal.     Breath sounds: Normal breath sounds. No wheezing or rales.  Chest:     Chest wall: No tenderness.  Abdominal:     General: Bowel sounds are normal. There is no distension.     Palpations: Abdomen is soft. There is no mass.     Tenderness: There is no abdominal tenderness.  Musculoskeletal:     Right lower leg: No edema.     Left lower leg: No edema.     Comments: Slight right wrist edema with TTP on ROM Left is normal  Neurological:     Mental Status:  She is alert and oriented to person, place, and time.     Sensory: Sensory deficit (hyperesthesia of dorsum of R hand) present.  Psychiatric:        Mood and Affect: Mood normal.        Latest Ref Rng & Units 08/02/2024    9:18 AM 01/18/2024   12:33 PM 04/20/2023    9:13 AM  CMP  Glucose 70 - 99 mg/dL 890  716  870   BUN 8 - 27 mg/dL 16  23  15    Creatinine 0.57 - 1.00 mg/dL 9.00  8.92  8.96   Sodium 134 - 144 mmol/L 143  137  139   Potassium 3.5 - 5.2 mmol/L 3.8  4.0  4.1   Chloride 96 - 106 mmol/L 106  96  99   CO2 20 - 29 mmol/L 24  24  27    Calcium  8.7 - 10.3  mg/dL 8.7  9.7  9.1   Total Protein 6.0 - 8.5 g/dL 6.6  7.3  6.8   Total Bilirubin 0.0 - 1.2 mg/dL 0.3  0.4  0.3   Alkaline Phos 49 - 135 IU/L 105  123  108   AST 0 - 40 IU/L 15  16  21    ALT 0 - 32 IU/L 10  17  19      Lipid Panel     Component Value Date/Time   CHOL 158 08/02/2024 0918   TRIG 122 08/02/2024 0918   HDL 34 (L) 08/02/2024 0918   CHOLHDL 4.1 04/15/2021 1014   LDLCALC 102 (H) 08/02/2024 0918    CBC    Component Value Date/Time   WBC 8.2 01/18/2024 1233   WBC 10.9 (H) 12/03/2020 1751   RBC 5.04 01/18/2024 1233   RBC 4.56 12/03/2020 1751   HGB 13.4 01/18/2024 1233   HGB 11.9 10/28/2011 0936   HCT 43.0 01/18/2024 1233   HCT 35.9 10/28/2011 0936   PLT 267 01/18/2024 1233   MCV 85 01/18/2024 1233   MCV 82.3 10/28/2011 0936   MCH 26.6 01/18/2024 1233   MCH 26.1 12/03/2020 1751   MCHC 31.2 (L) 01/18/2024 1233   MCHC 31.7 12/03/2020 1751   RDW 13.9 01/18/2024 1233   RDW 15.1 (H) 10/28/2011 0936   LYMPHSABS 2.6 01/18/2024 1233   LYMPHSABS 1.8 10/28/2011 0936   MONOABS 0.8 12/03/2020 1751   MONOABS 0.5 10/28/2011 0936   EOSABS 0.1 01/18/2024 1233   BASOSABS 0.1 01/18/2024 1233   BASOSABS 0.0 10/28/2011 0936    Lab Results  Component Value Date   HGBA1C 8.9 (A) 08/02/2024    Lab Results  Component Value Date   HGBA1C 8.9 (A) 08/02/2024   HGBA1C 10.0 (A) 01/18/2024   HGBA1C 7.3 (A) 04/20/2023       Assessment & Plan Right hand pain and swelling Chronic pain and swelling since July, severe and nocturnal, with differential including gout and neuropathy. Examination shows swelling, inflammation, and atypical neuropathic pain on wrist flexion. - Ordered blood test for uric acid. - Ordered x-ray of right hand. - Ordered complete blood count. - Prescribed prednisone  for inflammation. - Adjusted gabapentin  to 600 mg in the evening. - Will consider referral to orthopedics based on test results.  Type 2 diabetes mellitus with diabetic polyneuropathy A1c  improved from 10.0% to 8.9%.  Goal is less than 7.0 high blood sugars likely due to increased lemonade consumption. Neuropathy symptoms present in right hand. - Increased Lantus  dose from 45 units to 52 units. -  Continue maximum dose of Ozempic . - Advised reduction in lemonade consumption. -Counseled on Diabetic diet, the healthy plate, 849 minutes of moderate intensity exercise/week Blood sugar logs with fasting goals of 80-120 mg/dl, random of less than 819 and in the event of sugars less than 60 mg/dl or greater than 599 mg/dl encouraged to notify the clinic. Advised on the need for annual eye exams, annual foot exams, Pneumonia vaccine.   Hypertension associated with type 2 diabetes mellitus Blood pressure elevated, likely stress-related. Previous reading was 119/74 mmHg at last office visit - Continue current regimen - Will reassess blood pressure in three months. -Counseled on blood pressure goal of less than 130/80, low-sodium, DASH diet, medication compliance, 150 minutes of moderate intensity exercise per week. Discussed medication compliance, adverse effects.   Hyperlipidemia associated with type 2 diabetes mellitus Cholesterol levels previously uncontrolled. Awaiting lipid panel results. - Ordered lipid panel. - Will adjust atorvastatin  dose based on lipid panel results. - Advised on low cholesterol diet.  General Health Maintenance Due for colonoscopy screening. Previous colonoscopy in 2022 showed polyps. History of colon polyps- Placed referral for colonoscopy.     Meds ordered this encounter  Medications   insulin  glargine (LANTUS  SOLOSTAR) 100 UNIT/ML Solostar Pen    Sig: Inject 52 Units into the skin daily.    Dispense:  30 mL    Refill:  6    Dose increase   predniSONE  (DELTASONE ) 20 MG tablet    Sig: Take 1 tablet (20 mg total) by mouth daily with breakfast.    Dispense:  5 tablet    Refill:  0   gabapentin  (NEURONTIN ) 300 MG capsule    Sig: Take orally 1  capsule in the morning and afternoon and 2 capsules in the evening    Dispense:  360 capsule    Refill:  1    Dose increase   DULoxetine  (CYMBALTA ) 60 MG capsule    Sig: Take 1 capsule (60 mg total) by mouth daily for neuropathy.    Dispense:  90 capsule    Refill:  1   lisinopril -hydrochlorothiazide  (ZESTORETIC ) 20-25 MG tablet    Sig: Take 1 tablet by mouth daily.Must have office visit for refills    Dispense:  90 tablet    Refill:  1   metoprolol  succinate (TOPROL -XL) 50 MG 24 hr tablet    Sig: Take 1 tablet (50 mg total) by mouth daily.    Dispense:  90 tablet    Refill:  1    Return in about 3 months (around 10/31/2024) for Chronic medical conditions.       Corrina Sabin, MD, FAAFP. [facility] [place], [place] [phone]   08/03/2024, 10:59 AM     [1] No Known Allergies  "

## 2024-08-03 ENCOUNTER — Encounter: Payer: Self-pay | Admitting: Family Medicine

## 2024-08-03 DIAGNOSIS — E1159 Type 2 diabetes mellitus with other circulatory complications: Secondary | ICD-10-CM | POA: Insufficient documentation

## 2024-08-03 DIAGNOSIS — E1169 Type 2 diabetes mellitus with other specified complication: Secondary | ICD-10-CM | POA: Insufficient documentation

## 2024-08-03 LAB — CMP14+EGFR
ALT: 10 IU/L (ref 0–32)
AST: 15 IU/L (ref 0–40)
Albumin: 3.8 g/dL — ABNORMAL LOW (ref 3.9–4.9)
Alkaline Phosphatase: 105 IU/L (ref 49–135)
BUN/Creatinine Ratio: 16 (ref 12–28)
BUN: 16 mg/dL (ref 8–27)
Bilirubin Total: 0.3 mg/dL (ref 0.0–1.2)
CO2: 24 mmol/L (ref 20–29)
Calcium: 8.7 mg/dL (ref 8.7–10.3)
Chloride: 106 mmol/L (ref 96–106)
Creatinine, Ser: 0.99 mg/dL (ref 0.57–1.00)
Globulin, Total: 2.8 g/dL (ref 1.5–4.5)
Glucose: 109 mg/dL — ABNORMAL HIGH (ref 70–99)
Potassium: 3.8 mmol/L (ref 3.5–5.2)
Sodium: 143 mmol/L (ref 134–144)
Total Protein: 6.6 g/dL (ref 6.0–8.5)
eGFR: 63 mL/min/1.73

## 2024-08-03 LAB — LP+NON-HDL CHOLESTEROL
Cholesterol, Total: 158 mg/dL (ref 100–199)
HDL: 34 mg/dL — ABNORMAL LOW
LDL Chol Calc (NIH): 102 mg/dL — ABNORMAL HIGH (ref 0–99)
Total Non-HDL-Chol (LDL+VLDL): 124 mg/dL (ref 0–129)
Triglycerides: 122 mg/dL (ref 0–149)
VLDL Cholesterol Cal: 22 mg/dL (ref 5–40)

## 2024-08-03 LAB — T3: T3, Total: 92 ng/dL (ref 71–180)

## 2024-08-03 LAB — MICROALBUMIN / CREATININE URINE RATIO
Creatinine, Urine: 106.2 mg/dL
Microalb/Creat Ratio: 161 mg/g{creat} — ABNORMAL HIGH (ref 0–29)
Microalbumin, Urine: 170.6 ug/mL

## 2024-08-03 LAB — URIC ACID: Uric Acid: 5.6 mg/dL (ref 3.0–7.2)

## 2024-08-03 LAB — TSH: TSH: 0.81 u[IU]/mL (ref 0.450–4.500)

## 2024-08-03 LAB — T4, FREE: Free T4: 1.42 ng/dL (ref 0.82–1.77)

## 2024-08-06 ENCOUNTER — Other Ambulatory Visit: Payer: Self-pay

## 2024-08-06 ENCOUNTER — Ambulatory Visit: Payer: Self-pay | Admitting: Family Medicine

## 2024-08-06 DIAGNOSIS — E039 Hypothyroidism, unspecified: Secondary | ICD-10-CM

## 2024-08-06 DIAGNOSIS — M79641 Pain in right hand: Secondary | ICD-10-CM

## 2024-08-06 MED ORDER — LEVOTHYROXINE SODIUM 125 MCG PO TABS
125.0000 ug | ORAL_TABLET | Freq: Every day | ORAL | 1 refills | Status: AC
  Filled 2024-08-06: qty 90, 90d supply, fill #0

## 2024-08-08 ENCOUNTER — Other Ambulatory Visit: Payer: Self-pay

## 2024-08-22 ENCOUNTER — Other Ambulatory Visit: Payer: Self-pay

## 2024-08-24 ENCOUNTER — Other Ambulatory Visit: Payer: Self-pay

## 2024-08-24 NOTE — Progress Notes (Signed)
 Brenda [name]                                          MRN: [id number]   08/24/2024   The [facility] Quality Team Specialist reviewed this patient medical record for the purposes of chart review for care gap closure. The following were reviewed: chart review for care gap closure-glycemic status assessment.    [facility] Quality Team

## 2024-08-27 ENCOUNTER — Ambulatory Visit: Admitting: Orthopedic Surgery

## 2024-09-13 ENCOUNTER — Ambulatory Visit: Admitting: Orthopedic Surgery

## 2024-09-20 ENCOUNTER — Other Ambulatory Visit (HOSPITAL_BASED_OUTPATIENT_CLINIC_OR_DEPARTMENT_OTHER)

## 2024-10-31 ENCOUNTER — Ambulatory Visit: Payer: Self-pay | Admitting: Family Medicine
